# Patient Record
Sex: Female | Born: 1955 | Race: White | Hispanic: No | Smoking: Never smoker
Health system: Southern US, Community
[De-identification: ages and names within clinical notes are randomized; demographics above are authoritative.]

## PROBLEM LIST (undated history)

## (undated) DIAGNOSIS — G8929 Other chronic pain: Secondary | ICD-10-CM

## (undated) DIAGNOSIS — F419 Anxiety disorder, unspecified: Secondary | ICD-10-CM

## (undated) DIAGNOSIS — F32A Depression, unspecified: Secondary | ICD-10-CM

## (undated) DIAGNOSIS — C801 Malignant (primary) neoplasm, unspecified: Secondary | ICD-10-CM

## (undated) DIAGNOSIS — K449 Diaphragmatic hernia without obstruction or gangrene: Secondary | ICD-10-CM

## (undated) DIAGNOSIS — J189 Pneumonia, unspecified organism: Secondary | ICD-10-CM

## (undated) DIAGNOSIS — Z9889 Other specified postprocedural states: Secondary | ICD-10-CM

## (undated) DIAGNOSIS — R112 Nausea with vomiting, unspecified: Secondary | ICD-10-CM

## (undated) DIAGNOSIS — R7303 Prediabetes: Secondary | ICD-10-CM

## (undated) DIAGNOSIS — J9690 Respiratory failure, unspecified, unspecified whether with hypoxia or hypercapnia: Secondary | ICD-10-CM

## (undated) DIAGNOSIS — F4310 Post-traumatic stress disorder, unspecified: Secondary | ICD-10-CM

## (undated) DIAGNOSIS — M542 Cervicalgia: Secondary | ICD-10-CM

## (undated) DIAGNOSIS — R51 Headache: Secondary | ICD-10-CM

## (undated) DIAGNOSIS — N83209 Unspecified ovarian cyst, unspecified side: Secondary | ICD-10-CM

## (undated) DIAGNOSIS — J690 Pneumonitis due to inhalation of food and vomit: Secondary | ICD-10-CM

## (undated) DIAGNOSIS — F329 Major depressive disorder, single episode, unspecified: Secondary | ICD-10-CM

## (undated) DIAGNOSIS — K219 Gastro-esophageal reflux disease without esophagitis: Secondary | ICD-10-CM

## (undated) DIAGNOSIS — J40 Bronchitis, not specified as acute or chronic: Secondary | ICD-10-CM

## (undated) DIAGNOSIS — M1990 Unspecified osteoarthritis, unspecified site: Secondary | ICD-10-CM

## (undated) HISTORY — DX: Major depressive disorder, single episode, unspecified: F32.9

## (undated) HISTORY — DX: Other chronic pain: G89.29

## (undated) HISTORY — DX: Post-traumatic stress disorder, unspecified: F43.10

## (undated) HISTORY — PX: OTHER SURGICAL HISTORY: SHX169

## (undated) HISTORY — DX: Anxiety disorder, unspecified: F41.9

## (undated) HISTORY — DX: Cervicalgia: M54.2

## (undated) HISTORY — DX: Depression, unspecified: F32.A

---
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
## 1984-09-12 HISTORY — PX: ABDOMINAL HYSTERECTOMY: SHX81

## 1984-09-12 HISTORY — PX: CERVIX SURGERY: SHX593

## 1998-07-13 ENCOUNTER — Ambulatory Visit (HOSPITAL_COMMUNITY): Admission: RE | Admit: 1998-07-13 | Discharge: 1998-07-13 | Payer: Self-pay | Admitting: Obstetrics and Gynecology

## 1998-07-13 ENCOUNTER — Encounter: Payer: Self-pay | Admitting: Obstetrics and Gynecology

## 1998-08-14 ENCOUNTER — Other Ambulatory Visit: Admission: RE | Admit: 1998-08-14 | Discharge: 1998-08-14 | Payer: Self-pay | Admitting: Obstetrics and Gynecology

## 1999-07-26 ENCOUNTER — Encounter: Payer: Self-pay | Admitting: Obstetrics and Gynecology

## 1999-07-26 ENCOUNTER — Ambulatory Visit (HOSPITAL_COMMUNITY): Admission: RE | Admit: 1999-07-26 | Discharge: 1999-07-26 | Payer: Self-pay | Admitting: Obstetrics and Gynecology

## 1999-08-10 ENCOUNTER — Other Ambulatory Visit: Admission: RE | Admit: 1999-08-10 | Discharge: 1999-08-10 | Payer: Self-pay | Admitting: Obstetrics and Gynecology

## 1999-12-03 ENCOUNTER — Emergency Department (HOSPITAL_COMMUNITY): Admission: EM | Admit: 1999-12-03 | Discharge: 1999-12-03 | Payer: Self-pay | Admitting: Emergency Medicine

## 2000-07-28 ENCOUNTER — Ambulatory Visit (HOSPITAL_COMMUNITY): Admission: RE | Admit: 2000-07-28 | Discharge: 2000-07-28 | Payer: Self-pay | Admitting: Obstetrics and Gynecology

## 2000-07-28 ENCOUNTER — Encounter: Payer: Self-pay | Admitting: Obstetrics and Gynecology

## 2000-08-01 ENCOUNTER — Encounter: Payer: Self-pay | Admitting: Obstetrics and Gynecology

## 2000-08-01 ENCOUNTER — Encounter: Admission: RE | Admit: 2000-08-01 | Discharge: 2000-08-01 | Payer: Self-pay | Admitting: Obstetrics and Gynecology

## 2000-08-21 ENCOUNTER — Other Ambulatory Visit: Admission: RE | Admit: 2000-08-21 | Discharge: 2000-08-21 | Payer: Self-pay | Admitting: Obstetrics and Gynecology

## 2001-08-08 ENCOUNTER — Encounter: Payer: Self-pay | Admitting: Obstetrics and Gynecology

## 2001-08-08 ENCOUNTER — Encounter: Admission: RE | Admit: 2001-08-08 | Discharge: 2001-08-08 | Payer: Self-pay | Admitting: Obstetrics and Gynecology

## 2001-11-07 ENCOUNTER — Other Ambulatory Visit: Admission: RE | Admit: 2001-11-07 | Discharge: 2001-11-07 | Payer: Self-pay | Admitting: Obstetrics and Gynecology

## 2002-08-14 ENCOUNTER — Encounter: Payer: Self-pay | Admitting: Obstetrics and Gynecology

## 2002-08-14 ENCOUNTER — Ambulatory Visit (HOSPITAL_COMMUNITY): Admission: RE | Admit: 2002-08-14 | Discharge: 2002-08-14 | Payer: Self-pay | Admitting: Obstetrics and Gynecology

## 2002-11-27 ENCOUNTER — Other Ambulatory Visit: Admission: RE | Admit: 2002-11-27 | Discharge: 2002-11-27 | Payer: Self-pay | Admitting: Obstetrics and Gynecology

## 2003-05-12 ENCOUNTER — Encounter: Payer: Self-pay | Admitting: Obstetrics and Gynecology

## 2003-05-12 ENCOUNTER — Encounter: Admission: RE | Admit: 2003-05-12 | Discharge: 2003-05-12 | Payer: Self-pay | Admitting: Obstetrics and Gynecology

## 2003-05-21 ENCOUNTER — Encounter
Admission: RE | Admit: 2003-05-21 | Discharge: 2003-08-19 | Payer: Self-pay | Admitting: Physical Medicine & Rehabilitation

## 2004-03-24 ENCOUNTER — Encounter
Admission: RE | Admit: 2004-03-24 | Discharge: 2004-06-22 | Payer: Self-pay | Admitting: Physical Medicine & Rehabilitation

## 2004-05-13 ENCOUNTER — Encounter: Admission: RE | Admit: 2004-05-13 | Discharge: 2004-05-13 | Payer: Self-pay | Admitting: Obstetrics and Gynecology

## 2004-09-28 ENCOUNTER — Encounter
Admission: RE | Admit: 2004-09-28 | Discharge: 2004-12-27 | Payer: Self-pay | Admitting: Physical Medicine & Rehabilitation

## 2004-09-29 ENCOUNTER — Ambulatory Visit: Payer: Self-pay | Admitting: Physical Medicine & Rehabilitation

## 2004-10-07 ENCOUNTER — Other Ambulatory Visit: Admission: RE | Admit: 2004-10-07 | Discharge: 2004-10-07 | Payer: Self-pay | Admitting: Obstetrics and Gynecology

## 2005-03-11 ENCOUNTER — Encounter
Admission: RE | Admit: 2005-03-11 | Discharge: 2005-06-09 | Payer: Self-pay | Admitting: Physical Medicine & Rehabilitation

## 2005-03-16 ENCOUNTER — Ambulatory Visit: Payer: Self-pay | Admitting: Physical Medicine & Rehabilitation

## 2005-06-17 ENCOUNTER — Encounter: Admission: RE | Admit: 2005-06-17 | Discharge: 2005-06-17 | Payer: Self-pay | Admitting: Obstetrics and Gynecology

## 2005-09-19 ENCOUNTER — Other Ambulatory Visit: Admission: RE | Admit: 2005-09-19 | Discharge: 2005-09-19 | Payer: Self-pay | Admitting: Obstetrics and Gynecology

## 2006-07-11 ENCOUNTER — Ambulatory Visit: Payer: Self-pay | Admitting: Physical Medicine & Rehabilitation

## 2006-07-11 ENCOUNTER — Encounter
Admission: RE | Admit: 2006-07-11 | Discharge: 2006-10-09 | Payer: Self-pay | Admitting: Physical Medicine & Rehabilitation

## 2006-07-13 ENCOUNTER — Encounter: Admission: RE | Admit: 2006-07-13 | Discharge: 2006-07-13 | Payer: Self-pay | Admitting: Obstetrics and Gynecology

## 2007-01-03 ENCOUNTER — Ambulatory Visit: Payer: Self-pay | Admitting: Physical Medicine & Rehabilitation

## 2007-01-03 ENCOUNTER — Encounter
Admission: RE | Admit: 2007-01-03 | Discharge: 2007-04-03 | Payer: Self-pay | Admitting: Physical Medicine & Rehabilitation

## 2007-06-29 ENCOUNTER — Encounter
Admission: RE | Admit: 2007-06-29 | Discharge: 2007-07-02 | Payer: Self-pay | Admitting: Physical Medicine & Rehabilitation

## 2007-06-29 ENCOUNTER — Ambulatory Visit: Payer: Self-pay | Admitting: Physical Medicine & Rehabilitation

## 2007-08-02 ENCOUNTER — Encounter: Admission: RE | Admit: 2007-08-02 | Discharge: 2007-08-02 | Payer: Self-pay | Admitting: Obstetrics and Gynecology

## 2007-11-02 ENCOUNTER — Encounter
Admission: RE | Admit: 2007-11-02 | Discharge: 2007-11-14 | Payer: Self-pay | Admitting: Physical Medicine & Rehabilitation

## 2007-11-13 ENCOUNTER — Ambulatory Visit: Payer: Self-pay | Admitting: Physical Medicine & Rehabilitation

## 2008-02-15 ENCOUNTER — Ambulatory Visit: Payer: Self-pay | Admitting: Physical Medicine & Rehabilitation

## 2008-02-15 ENCOUNTER — Encounter
Admission: RE | Admit: 2008-02-15 | Discharge: 2008-02-15 | Payer: Self-pay | Admitting: Physical Medicine & Rehabilitation

## 2008-07-31 ENCOUNTER — Encounter
Admission: RE | Admit: 2008-07-31 | Discharge: 2008-08-01 | Payer: Self-pay | Admitting: Physical Medicine & Rehabilitation

## 2008-08-01 ENCOUNTER — Ambulatory Visit: Payer: Self-pay | Admitting: Physical Medicine & Rehabilitation

## 2008-08-12 ENCOUNTER — Encounter: Admission: RE | Admit: 2008-08-12 | Discharge: 2008-08-12 | Payer: Self-pay | Admitting: Obstetrics and Gynecology

## 2009-04-10 ENCOUNTER — Ambulatory Visit: Payer: Self-pay | Admitting: Family Medicine

## 2009-04-10 DIAGNOSIS — F411 Generalized anxiety disorder: Secondary | ICD-10-CM | POA: Insufficient documentation

## 2009-04-10 DIAGNOSIS — M25559 Pain in unspecified hip: Secondary | ICD-10-CM | POA: Insufficient documentation

## 2009-04-10 DIAGNOSIS — M542 Cervicalgia: Secondary | ICD-10-CM | POA: Insufficient documentation

## 2009-04-10 DIAGNOSIS — F329 Major depressive disorder, single episode, unspecified: Secondary | ICD-10-CM | POA: Insufficient documentation

## 2009-04-10 DIAGNOSIS — M25519 Pain in unspecified shoulder: Secondary | ICD-10-CM | POA: Insufficient documentation

## 2009-04-10 DIAGNOSIS — F3289 Other specified depressive episodes: Secondary | ICD-10-CM | POA: Insufficient documentation

## 2009-06-23 ENCOUNTER — Ambulatory Visit: Payer: Self-pay | Admitting: Family Medicine

## 2009-06-23 DIAGNOSIS — J019 Acute sinusitis, unspecified: Secondary | ICD-10-CM | POA: Insufficient documentation

## 2009-08-13 ENCOUNTER — Encounter: Admission: RE | Admit: 2009-08-13 | Discharge: 2009-08-13 | Payer: Self-pay | Admitting: Obstetrics and Gynecology

## 2010-01-07 ENCOUNTER — Ambulatory Visit: Payer: Self-pay | Admitting: Family Medicine

## 2010-03-14 ENCOUNTER — Emergency Department (HOSPITAL_COMMUNITY): Admission: EM | Admit: 2010-03-14 | Discharge: 2010-03-14 | Payer: Self-pay | Admitting: Emergency Medicine

## 2010-03-19 ENCOUNTER — Telehealth: Payer: Self-pay | Admitting: Internal Medicine

## 2010-09-01 ENCOUNTER — Encounter
Admission: RE | Admit: 2010-09-01 | Discharge: 2010-09-01 | Payer: Self-pay | Source: Home / Self Care | Attending: Family Medicine | Admitting: Family Medicine

## 2010-09-12 HISTORY — PX: KNEE SURGERY: SHX244
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
## 2010-10-12 NOTE — Progress Notes (Signed)
Summary: walk-in  Phone Note Other Incoming   Caller: walked in Summary of Call: C/o heartburfor med. Advisede Dr Jacki Cones. Went to ER [facility]) Sunday with chest burning. Tests done, incl CT which showed hiatal hernia. Advised Dr Clent Ridges out of office- try OTC meds (prevacid or prilosec) and call if not better. Initial call taken by: Raechel Ache, RN,  March 19, 2010 3:02 PM

## 2010-11-28 LAB — POCT I-STAT, CHEM 8
BUN: 15 mg/dL (ref 6–23)
Chloride: 108 mEq/L (ref 96–112)
Hemoglobin: 14.3 g/dL (ref 12.0–15.0)
Potassium: 4.2 mEq/L (ref 3.5–5.1)
Sodium: 144 mEq/L (ref 135–145)
TCO2: 28 mmol/L (ref 0–100)

## 2010-11-28 LAB — D-DIMER, QUANTITATIVE: D-Dimer, Quant: 0.79 ug/mL-FEU — ABNORMAL HIGH (ref 0.00–0.48)

## 2011-01-25 NOTE — Assessment & Plan Note (Signed)
Ms. Jobst returns to clinic today for followup evaluation.  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  She does do some lifting does  as much as 30 pounds but occasionally lifts as little as 1-5 pounds.  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   The patient reports that her OB/GYN has started her on some sleep  medication and she would like a refill on that.  She is trying to check  on that so that we can get that refilled for her today.  She continues  to take the trazodone approximately 50 mg to 100 mg nightly.  She does  not need a refill on her Klonopin or Motrin at this time.   MEDICATIONS:  1. Motrin 400 mg 1-2 tablets p.o. t.i.d. p.r.n.  2. Klonopin 1 mg 1-2 tablets p.o. nightly.  3. Trazodone 50-100 mg nightly.   REVIEW OF SYSTEMS:  Positive for constipation, weight gain, and night  sweats.   PHYSICAL EXAMINATION:  GENERAL:  Well-appearing middle-aged adult female  in mild to no acute discomfort.  VITAL SIGNS:  Blood pressure 138/78 with pulse 35, respiratory rate 18,  and O2 saturation 98% on room air.  MUSCULOSKELETAL:  She has 4+/5 strength throughout the bilateral upper  and lower extremities.  She ambulates without any assistive device.   IMPRESSION:  1. 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  2. History of migraine headaches secondary to shoulder and neck      muscular pain.  3. Status post right tibia fracture.   In the office today we did refill the patient's trazodone.  We will also  refill the sleep medication once she informs [place] what that is that has  been prescribed by her OB/GYN.  We will plan on seeing the patient in  follow up in approximately 6 months' time with refills prior to that  appointment as necessary.           ______________________________  Ellwood Dense, M.D.     DC/MedQ  D:  08/01/2008 15:45:37  T:  08/02/2008 04:56:36  Job #:  [id number]
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
## 2011-01-25 NOTE — Assessment & Plan Note (Signed)
[name] returns to the clinic today for followup evaluation.  She reports that she has been back to work for approximately one month.  She works generally from 1:00 a.m. to 5:00 a.m.  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  She works a maximum  of 4 hours per day and feels that that is all she can tolerate at the  present time.  She states that her employer is happy with her present  workload.  She does request that I fill out a duty status report for her  as I periodically do approximately every 6 to 9 months.  We did complete  that duty status report form for her at a maximum of 4 hours per day  continuous lifting of 0 to 15 pounds and intermittent lifting of 10 to  15 pounds.  She has a copy of that form, and we will keep a copy in our  records.   MEDICATIONS:  1. Motrin 400 mg 1 to 2 tablets p.o. t.i.d. p.r.n.  2. Klonopin 1 mg 1 to 2 tablets p.o. q.h.s.  3. Trazodone q.h.s. p.r.n.   REVIEW OF SYSTEMS:  Positive for constipation and weight gain.   PHYSICAL EXAMINATION:  GENERAL:  A well-appearing middle-aged adult  female in mild to no acute discomfort.  VITAL SIGNS:  Blood pressure 108/60 with a pulse of 74, respiratory rate  18, and O2 saturation 99% on room air.  She has 4+/5 strength throughout  the bilateral upper and lower extremities.  Bulk and tone were normal,  and she ambulates without any assistive device.   IMPRESSION:  1. 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  2. History of migraine headaches secondary to shoulder and neck      muscular pain.  3. Status post right tibia fracture.   In the office today, the duty status report was completed for this  patient, a maximum of 4 hours per day of work.  We will plan on seeing  the patient in followup in this office in  approximately 4 to 6 months'  time for routine followup.           ______________________________  Ellwood Dense, M.D.     DC/MedQ  D:  02/15/2008 15:38:06  T:  02/15/2008 17:11:56  Job #:  [id number]
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
## 2011-01-28 NOTE — Assessment & Plan Note (Signed)
HISTORY OF PRESENT ILLNESS:  Ms. [name] returns to the clinic today for  follow up evaluation.  I last saw her in this office March 26, 2004.  The  patient reports that she was out of work between May 2005 and January 2006.  She reports that she is back to work but is actually just discussing with  her employer what type of work she might do.  She actually needs as slip  from the office today stating that she was here for this evaluation.  She  has worked previously in Physicist, medical for the postal  department, but is not sure what type of work she would return to.  She  presently is using Motrin, approximately 1-2 tablets either 2-3 times per  day.  She reports that her pain is slightly eased while she is not working.  She also uses Imitrex on an as needed basis.  She reports that she has  applied for disability retirement and needs copies of some paperwork as  apparently the post office has lost some of the prior paperwork that she  submitted.   MEDICATIONS:  1.  Motrin 1-2 tablets p.o. b.i.d. p.r.n.  2.  Imitrex one tablet daily p.r.n.   PHYSICAL EXAMINATION:  GENERAL APPEARANCE:  Well-appearing, fit adult  female.  VITAL SIGNS:  Blood pressure 129/69 with pulse 95, respirations 16, O2  saturation 95% on room air.  EXTREMITIES:  She has 5-/5 strength throughout the bilateral upper  extremities.  She has decreased range of motion throughout the right upper  extremity with normal range of motion in the left upper extremity.  She  complains of pain with range of motion to the right upper extremity.   IMPRESSION:  1.  Mild/moderate degenerative disk disease.  2.  Chronic right shoulder pain secondary to tendonitis.   PLAN:  At the present time, we have completed paperwork for her in the  office.  We have also given her a slip for an excuse from work today as she  was seen in this office.  We plan on seeing her in follow up in  approximately 6 months time.  She again  is discussing with her prior  employer what type of work she might do.  She would need some restrictions  in terms of overhead arm use, especially on the right side.  Those  restrictions have been stated previously.   We plan on seeing the patient in follow up in this office in 6 months time  or before if needed.       DC/MedQ  D:  09/30/2004 09:41:41  T:  09/30/2004 10:10:12  Job #:  [id number]

## 2011-01-28 NOTE — Assessment & Plan Note (Signed)
[name] returns to the clinic today for followup evaluation. She  basically is coming back to have paperwork completed for her disability  evaluation. I had done a note back in September of 2004 and addressed this  to the proper people at that time. Apparently those people have moved on and  she needs an updated note. I will be dictating that as soon as possible and  get that off to her by phone call so that she can pick it up when it is  ready. The patient continues to have problems involving her right arm. She  reports that she went back to work approximately 3-4 hours per day, 1-2  nights per week and reports that she still has problems involving her arm  especially when she is moving it around and lifting things. 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   MEDICATIONS:  1.  Motrin 400 mg 1-2 tablets p.o. b.i.d.  2.  Klonopin 1 mg daily p.r.n.   PHYSICAL EXAMINATION:  A well appearing fit adult female in mild acute  discomfort. Blood pressure 114/64 with a pulse of 86, respiratory rate 16,  and O2 saturation 97% on room air. She has 5-/5 strength throughout the left  upper and 4/5 strength throughout the right upper extremity.   IMPRESSION:  1.  Mild/moderate degenerative disk disease.  2.  Chronic right shoulder pain secondary to tendinitis.   In the office today, I did give her a slip that she was seen in the office  today and I also refilled her ibuprofen. Will dictate an up to date note for  her regarding her impairments and get that off to the dictation department  so that we can get that returned to her for her purpose as necessary. Will  plan on seeing her in followup in approximately six months time.       DC/MedQ  D:  03/17/2005 10:14:48  T:  03/17/2005 10:34:45  Job #:  [id number]

## 2011-01-28 NOTE — Assessment & Plan Note (Signed)
[name] returns to clinic today for follow up evaluation.  I last  saw her in this office March 16, 2005.  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  She then gradually increased to three days per  week, a total of 6-7 hours each day.  She subsequently went on vacation in  September and decided that she needed to shift to daylight work to provide  supervision for her small grandchild that she has custody of.  She started  working in  approximately October daylight shift and has been working 40  hours per week.  She reports that she is having a lot of pain in her neck  and shoulder along with migraine headaches.  She reports that these have  been worse as the job she is doing now requires her to stand approximately  40 hours per week.  Her job in the evening hours was a lot less strenuous.  She also reports that she is having poor sleep and needs to have a refill on  her Klonopin medication to allow her to have adequate sleep to help with her  pain, overall.  She wonders about cutting back to a lesser amount of work on  a daily basis for at least a short time to see if she could build up again.   MEDICATIONS:  1. Motrin 400 mg 1-2 tablets p.o. b.i.d. to t.i.d.  2. Klonopin 1 mg, 1/2 tablet to 1 tablet q.h.s. p.r.n.  3. Dermatex 100 mg 1 to 3 tablets p.o. daily for migraine headaches.   REVIEW OF SYSTEMS:  Positive for constipation.   PHYSICAL EXAMINATION:  Reasonably well appearing fit adult female in mild  acute discomfort.  Blood pressure 118/71, pulse 84, respiratory rate 16, and  O2 saturation 100% on room air.  She has 5-/5 strength of the left upper  extremity and 4/5 strength of the right upper extremity.  She has decreased  range of motion of her cervical spine and pain with shoulder flexion and  abduction beyond 90 degrees.   IMPRESSION:  1. Mild/moderate degenerative disc  disease of the cervical spine.  2. Chronic right shoulder pain secondary to tendinitis.  3. History of migraine headaches secondary to shoulder/neck muscular pain.   In the office today, we did give the patient a new prescription for Imitrex  along with her Klonopin and Motrin.  We also completed a form for the [place] [employer] asking that she do her present job six hours per  day, five days per week, for a maximum of 30 hours per week.  She is allowed  continuous lifting of 0 to 5 pounds and intermittent lifting of 10 to 15  pounds with avoidance of over shoulder work to no more than two hours per  day.   We will plan on seeing the patient in follow up in this office in  approximately six months time and we will adjust her work schedule as  tolerable over the next several months.           ______________________________  Ellwood Dense, M.D.     DC/MedQ  D:  07/12/2006 15:07:44  T:  07/12/2006 22:04:47  Job #:  [id number]

## 2011-07-30 ENCOUNTER — Encounter: Payer: Self-pay | Admitting: *Deleted

## 2011-07-30 ENCOUNTER — Emergency Department (HOSPITAL_COMMUNITY)
Admission: EM | Admit: 2011-07-30 | Discharge: 2011-07-30 | Disposition: A | Discharge status: Home / Self Care | Payer: No Typology Code available for payment source | Attending: Emergency Medicine | Admitting: Emergency Medicine

## 2011-07-30 ENCOUNTER — Emergency Department (HOSPITAL_COMMUNITY): Payer: No Typology Code available for payment source

## 2011-07-30 DIAGNOSIS — W010XXA Fall on same level from slipping, tripping and stumbling without subsequent striking against object, initial encounter: Secondary | ICD-10-CM | POA: Insufficient documentation

## 2011-07-30 DIAGNOSIS — R61 Generalized hyperhidrosis: Secondary | ICD-10-CM | POA: Insufficient documentation

## 2011-07-30 DIAGNOSIS — S82409A Unspecified fracture of shaft of unspecified fibula, initial encounter for closed fracture: Secondary | ICD-10-CM | POA: Insufficient documentation

## 2011-07-30 DIAGNOSIS — M25539 Pain in unspecified wrist: Secondary | ICD-10-CM | POA: Insufficient documentation

## 2011-07-30 DIAGNOSIS — S52123A Displaced fracture of head of unspecified radius, initial encounter for closed fracture: Secondary | ICD-10-CM

## 2011-07-30 DIAGNOSIS — M25579 Pain in unspecified ankle and joints of unspecified foot: Secondary | ICD-10-CM | POA: Insufficient documentation

## 2011-07-30 HISTORY — DX: Unspecified ovarian cyst, unspecified side: N83.209

## 2011-07-30 MED ORDER — SODIUM CHLORIDE 0.9 % IV BOLUS (SEPSIS)
1000.0000 mL | Freq: Once | INTRAVENOUS | Status: AC
  Administered 2011-07-30: 1000 mL via INTRAVENOUS

## 2011-07-30 MED ORDER — HYDROCODONE-ACETAMINOPHEN 5-325 MG PO TABS: 2.0000 | ORAL_TABLET | Freq: Three times a day (TID) | ORAL | Status: DC | PRN

## 2011-07-30 MED ORDER — HYDROMORPHONE HCL PF 1 MG/ML IJ SOLN: 1.0000 mg | Freq: Once | INTRAMUSCULAR | Status: DC

## 2011-07-30 MED ORDER — HYDROCODONE-ACETAMINOPHEN 10-325 MG PO TABS
1.0000 | ORAL_TABLET | ORAL | Status: AC
  Administered 2011-07-30: 1 via ORAL
  Filled 2011-07-30: qty 1

## 2011-07-30 MED ORDER — HYDROMORPHONE HCL PF 2 MG/ML IJ SOLN
INTRAMUSCULAR | Status: AC
  Administered 2011-07-30: 1 mg
  Filled 2011-07-30: qty 1

## 2011-07-30 NOTE — ED Notes (Signed)
Ice given to pt per request.

## 2011-07-30 NOTE — ED Notes (Signed)
Pt fell about 1:30 pm, injury to left elbow, arm, thumb,

## 2011-07-30 NOTE — ED Notes (Signed)
Awaiting splints.  Pain meds given.

## 2011-07-30 NOTE — ED Provider Notes (Signed)
History     CSN: [id number] Arrival date & time: 07/30/2011  4:05 PM   First MD Initiated Contact with Patient 07/30/11 1627      Chief Complaint  Patient presents with  . Arm Injury    left elbow  . Ankle Pain    left ankle  . Hand Injury    left thumb    (Consider location/radiation/quality/duration/timing/severity/associated sxs/prior treatment) HPI The patient presents 4 hours after a fall with pain in her left arm, left ankle, and right wrist.  She notes that she slipped, and fell onto concrete. Since the event she said pain, described as sharp in all of the aforementioned areas. The pain has not been relieved with anything, it is worse with motion. No head,, no LOC, no disorientation, no nausea. Past Medical History  Diagnosis Date  . Ovarian cyst     Past Surgical History  Procedure Date  . Knee surgery   . Leg surgery   . Cesarean section   . Abdominal hysterectomy     No family history on file.  History  Substance Use Topics  . Smoking status: Never Smoker   . Smokeless tobacco: Not on file  . Alcohol Use: Yes    OB History    Grav Para Term Preterm Abortions TAB SAB Ect Mult Living                  Review of Systems  All other systems reviewed and are negative.    Allergies  Meperidine hcl  Home Medications  No current outpatient prescriptions on file.  BP 130/80  Pulse 89  Temp(Src) 98.3 F (36.8 C) (Oral)  Resp 18  SpO2 99%  Physical Exam  Constitutional: She appears well-developed and well-nourished. No distress.  HENT:  Head: Normocephalic and atraumatic.  Eyes: EOM are normal. Pupils are equal, round, and reactive to light.  Neck: Neck supple. No thyromegaly present.  Cardiovascular: Regular rhythm and intact distal pulses.   Pulmonary/Chest: Effort normal.  Abdominal: She exhibits no distension.  Musculoskeletal:       The patient has tenderness in multiple areas. #1 right wrist, range of motion appropriate in all  dimensions, strength 5 out of 5 in all dimensions. No snuffbox tenderness. Diffuse pain on palpation circumferentially about the wrist. No overlying abrasion. #2 left ankle tenderness to palpation about the lateral anterior inferior tibia. No loss of range of motion, strength 5 out of 5. Distal pulses appropriate. No overlying edema, nor superficial color changes. #3, left elbow range of motion intact, 5 out of 5 strength in all dimensions, no overlying erythema or edema. #4 left proximal upper arm. No appreciable tenderness to palpation about the superiormost humerus. Tender to palpation about the left mid humerus, with pain preventing any range of motion exercise.  Lymphadenopathy:    She has no cervical adenopathy.  Skin: She is diaphoretic.    ED Course  Procedures (including critical care time)  Labs Reviewed - No data to display No results found.   No diagnosis found.   Rule out fracture versus contusion MDM  This 55 year old female presents following a mechanical fall with pain in her ankles, and left arm. On exam the patient is uncomfortable although in no distress.  X-rays demonstrate a displaced left radial head fracture, as well as a left distal fibula fracture.  The patient had her wounds dressed with a long arm splint on the left, and an immobilizing boot on the left as well.  A  discussion was held with patient regarding her necessity to be particularly careful with ambulating, noting that she is recently recovered from a prior knee intervention, and these new splints will change her balance. Patient will be discharged to follow up with orthopedics this week        Gerhard Munch, MD 07/30/11 2112

## 2011-08-02 ENCOUNTER — Other Ambulatory Visit: Payer: Self-pay | Admitting: Orthopedic Surgery

## 2011-08-02 ENCOUNTER — Ambulatory Visit
Admission: RE | Admit: 2011-08-02 | Discharge: 2011-08-02 | Disposition: A | Discharge status: Home / Self Care | Payer: No Typology Code available for payment source | Source: Ambulatory Visit | Attending: Orthopedic Surgery | Admitting: Orthopedic Surgery

## 2011-08-02 DIAGNOSIS — S52109A Unspecified fracture of upper end of unspecified radius, initial encounter for closed fracture: Secondary | ICD-10-CM

## 2011-08-05 ENCOUNTER — Other Ambulatory Visit: Payer: Self-pay

## 2011-08-05 ENCOUNTER — Encounter (HOSPITAL_COMMUNITY)
Admission: RE | Admit: 2011-08-05 | Discharge: 2011-08-05 | Disposition: A | Discharge status: Home / Self Care | Payer: No Typology Code available for payment source | Source: Ambulatory Visit | Attending: Orthopedic Surgery | Admitting: Orthopedic Surgery

## 2011-08-05 ENCOUNTER — Encounter (HOSPITAL_COMMUNITY): Payer: Self-pay

## 2011-08-05 HISTORY — DX: Gastro-esophageal reflux disease without esophagitis: K21.9

## 2011-08-05 HISTORY — DX: Malignant (primary) neoplasm, unspecified: C80.1

## 2011-08-05 HISTORY — DX: Pneumonia, unspecified organism: J18.9

## 2011-08-05 HISTORY — DX: Diaphragmatic hernia without obstruction or gangrene: K44.9

## 2011-08-05 HISTORY — DX: Headache: R51

## 2011-08-05 LAB — CBC
HCT: 33.7 % — ABNORMAL LOW (ref 36.0–46.0)
Hemoglobin: 10.3 g/dL — ABNORMAL LOW (ref 12.0–15.0)
MCH: 23.6 pg — ABNORMAL LOW (ref 26.0–34.0)
MCHC: 30.6 g/dL (ref 30.0–36.0)
MCV: 77.3 fL — ABNORMAL LOW (ref 78.0–100.0)

## 2011-08-05 LAB — BASIC METABOLIC PANEL
BUN: 13 mg/dL (ref 6–23)
CO2: 30 mEq/L (ref 19–32)
Chloride: 105 mEq/L (ref 96–112)
Glucose, Bld: 178 mg/dL — ABNORMAL HIGH (ref 70–99)
Potassium: 4.4 mEq/L (ref 3.5–5.1)

## 2011-08-05 MED ORDER — CEFAZOLIN SODIUM-DEXTROSE 2-3 GM-% IV SOLR: 2.0000 g | INTRAVENOUS | Status: DC

## 2011-08-05 MED ORDER — CHLORHEXIDINE GLUCONATE 4 % EX LIQD: 60.0000 mL | Freq: Once | CUTANEOUS | Status: DC

## 2011-08-05 NOTE — Pre-Procedure Instructions (Signed)
20 Anushri Casalino  08/05/2011   Your procedure is scheduled on:  08/06/11  Report to [facility] at 600 AM.  Call this number if you have problems the morning of surgery: [phone]   Remember:   Do not eat food:After Midnight.  may drink clear liquids up til: 4 Hours before arrival.  Take these medicines the morning of surgery with A SIP OF WATER:hydrocondone,cymbalta,prilosec, lyrica   Do not wear jewelry, make-up or nail polish.  Do not wear lotions, powders, or perfumes. You may wear deodorant.  Do not shave 48 hours prior to surgery.  Do not bring valuables to the hospital.  Contacts, dentures or bridgework may not be worn into surgery.  Leave suitcase in the car. After surgery it may be brought to your room.  For patients admitted to the hospital, checkout time is 11:00 AM the day of discharge.   Patients discharged the day of surgery will not be allowed to drive home.  Name and phone number of your driver:rodger [phone]  Special Instructions: CHG Shower Use Special Wash: 1/2 bottle night before surgery and 1/2 bottle morning of surgery.   Please read over the following fact sheets that you were given: Pain Booklet, Coughing and Deep Breathing, MRSA Information and Surgical Site Infection Prevention

## 2011-08-06 ENCOUNTER — Encounter (HOSPITAL_COMMUNITY): Payer: Self-pay | Admitting: Anesthesiology

## 2011-08-06 ENCOUNTER — Ambulatory Visit (HOSPITAL_COMMUNITY)
Admission: RE | Admit: 2011-08-06 | Discharge: 2011-08-07 | Disposition: A | Discharge status: Home / Self Care | Payer: No Typology Code available for payment source | Source: Ambulatory Visit | Attending: Orthopedic Surgery | Admitting: Orthopedic Surgery

## 2011-08-06 ENCOUNTER — Ambulatory Visit (HOSPITAL_COMMUNITY): Payer: No Typology Code available for payment source | Admitting: Anesthesiology

## 2011-08-06 ENCOUNTER — Encounter (HOSPITAL_COMMUNITY): Payer: Self-pay | Admitting: *Deleted

## 2011-08-06 ENCOUNTER — Encounter (HOSPITAL_COMMUNITY)
Admission: RE | Disposition: A | Discharge status: Home / Self Care | Payer: Self-pay | Source: Ambulatory Visit | Attending: Orthopedic Surgery

## 2011-08-06 DIAGNOSIS — F3289 Other specified depressive episodes: Secondary | ICD-10-CM

## 2011-08-06 DIAGNOSIS — J019 Acute sinusitis, unspecified: Secondary | ICD-10-CM

## 2011-08-06 DIAGNOSIS — M542 Cervicalgia: Secondary | ICD-10-CM

## 2011-08-06 DIAGNOSIS — F411 Generalized anxiety disorder: Secondary | ICD-10-CM

## 2011-08-06 DIAGNOSIS — X58XXXA Exposure to other specified factors, initial encounter: Secondary | ICD-10-CM | POA: Insufficient documentation

## 2011-08-06 DIAGNOSIS — S52123A Displaced fracture of head of unspecified radius, initial encounter for closed fracture: Secondary | ICD-10-CM | POA: Diagnosis present

## 2011-08-06 DIAGNOSIS — R112 Nausea with vomiting, unspecified: Secondary | ICD-10-CM

## 2011-08-06 DIAGNOSIS — Y929 Unspecified place or not applicable: Secondary | ICD-10-CM | POA: Insufficient documentation

## 2011-08-06 DIAGNOSIS — Z01812 Encounter for preprocedural laboratory examination: Secondary | ICD-10-CM | POA: Insufficient documentation

## 2011-08-06 DIAGNOSIS — S42493A Other displaced fracture of lower end of unspecified humerus, initial encounter for closed fracture: Secondary | ICD-10-CM | POA: Insufficient documentation

## 2011-08-06 DIAGNOSIS — M25559 Pain in unspecified hip: Secondary | ICD-10-CM

## 2011-08-06 DIAGNOSIS — F329 Major depressive disorder, single episode, unspecified: Secondary | ICD-10-CM

## 2011-08-06 DIAGNOSIS — Z9889 Other specified postprocedural states: Secondary | ICD-10-CM

## 2011-08-06 DIAGNOSIS — Z01818 Encounter for other preprocedural examination: Secondary | ICD-10-CM | POA: Insufficient documentation

## 2011-08-06 DIAGNOSIS — S53449A Ulnar collateral ligament sprain of unspecified elbow, initial encounter: Secondary | ICD-10-CM | POA: Insufficient documentation

## 2011-08-06 DIAGNOSIS — M25519 Pain in unspecified shoulder: Secondary | ICD-10-CM

## 2011-08-06 DIAGNOSIS — Z01810 Encounter for preprocedural cardiovascular examination: Secondary | ICD-10-CM | POA: Insufficient documentation

## 2011-08-06 HISTORY — DX: Nausea with vomiting, unspecified: R11.2

## 2011-08-06 HISTORY — DX: Other specified postprocedural states: Z98.890

## 2011-08-06 HISTORY — PX: ORIF ELBOW FRACTURE: SHX5031

## 2011-08-06 SURGERY — OPEN REDUCTION INTERNAL FIXATION (ORIF) ELBOW/OLECRANON FRACTURE
Anesthesia: General | Site: Elbow | Laterality: Left | Wound class: Clean

## 2011-08-06 MED ORDER — KCL IN DEXTROSE-NACL 20-5-0.45 MEQ/L-%-% IV SOLN
INTRAVENOUS | Status: DC
  Administered 2011-08-06 – 2011-08-07 (×2): via INTRAVENOUS
  Filled 2011-08-06 (×3): qty 1000

## 2011-08-06 MED ORDER — GLYCOPYRROLATE 0.2 MG/ML IJ SOLN
INTRAMUSCULAR | Status: DC | PRN
  Administered 2011-08-06: .6 mg via INTRAVENOUS

## 2011-08-06 MED ORDER — LACTATED RINGERS IV SOLN
INTRAVENOUS | Status: DC | PRN
  Administered 2011-08-06 (×2): via INTRAVENOUS

## 2011-08-06 MED ORDER — OXYCODONE-ACETAMINOPHEN 5-325 MG PO TABS
1.0000 | ORAL_TABLET | ORAL | Status: DC | PRN
  Administered 2011-08-06 – 2011-08-07 (×4): 2 via ORAL
  Filled 2011-08-06 (×4): qty 2

## 2011-08-06 MED ORDER — ONDANSETRON HCL 4 MG/2ML IJ SOLN: 4.0000 mg | Freq: Four times a day (QID) | INTRAMUSCULAR | Status: DC | PRN

## 2011-08-06 MED ORDER — TRAZODONE 25 MG HALF TABLET
25.0000 mg | ORAL_TABLET | Freq: Every day | ORAL | Status: DC
  Filled 2011-08-06 (×2): qty 1

## 2011-08-06 MED ORDER — HYDROMORPHONE HCL PF 1 MG/ML IJ SOLN
0.5000 mg | INTRAMUSCULAR | Status: DC | PRN
  Administered 2011-08-06 (×2): 1 mg via INTRAVENOUS
  Administered 2011-08-07: 0.5 mg via INTRAVENOUS
  Administered 2011-08-07 (×2): 1 mg via INTRAVENOUS
  Filled 2011-08-06 (×5): qty 1

## 2011-08-06 MED ORDER — METHOCARBAMOL 100 MG/ML IJ SOLN
500.0000 mg | Freq: Four times a day (QID) | INTRAVENOUS | Status: DC | PRN
  Filled 2011-08-06 (×2): qty 5

## 2011-08-06 MED ORDER — MEPERIDINE HCL 25 MG/ML IJ SOLN: 6.2500 mg | INTRAMUSCULAR | Status: DC | PRN

## 2011-08-06 MED ORDER — DULOXETINE HCL 30 MG PO CPEP
30.0000 mg | ORAL_CAPSULE | Freq: Two times a day (BID) | ORAL | Status: DC
  Administered 2011-08-06: 30 mg via ORAL
  Filled 2011-08-06 (×4): qty 1

## 2011-08-06 MED ORDER — METHOCARBAMOL 500 MG PO TABS
500.0000 mg | ORAL_TABLET | Freq: Four times a day (QID) | ORAL | Status: DC | PRN
  Administered 2011-08-07: 500 mg via ORAL
  Filled 2011-08-06: qty 1

## 2011-08-06 MED ORDER — HYDROCODONE-ACETAMINOPHEN 7.5-325 MG PO TABS: 1.0000 | ORAL_TABLET | ORAL | Status: DC | PRN

## 2011-08-06 MED ORDER — CEFAZOLIN SODIUM 1-5 GM-% IV SOLN
INTRAVENOUS | Status: DC | PRN
  Administered 2011-08-06: 2 g via INTRAVENOUS
  Administered 2011-08-07: 1000 mg via INTRAVASCULAR

## 2011-08-06 MED ORDER — IBUPROFEN 800 MG PO TABS
800.0000 mg | ORAL_TABLET | Freq: Four times a day (QID) | ORAL | Status: DC | PRN
  Filled 2011-08-06: qty 1

## 2011-08-06 MED ORDER — MIDAZOLAM HCL 2 MG/2ML IJ SOLN: 0.5000 mg | Freq: Once | INTRAMUSCULAR | Status: DC | PRN

## 2011-08-06 MED ORDER — OXYCODONE HCL 5 MG PO TABS
5.0000 mg | ORAL_TABLET | ORAL | Status: DC | PRN
  Administered 2011-08-07: 10 mg via ORAL
  Filled 2011-08-06: qty 2

## 2011-08-06 MED ORDER — HYDROCODONE-ACETAMINOPHEN 5-325 MG PO TABS: 2.0000 | ORAL_TABLET | Freq: Four times a day (QID) | ORAL | Status: DC | PRN

## 2011-08-06 MED ORDER — PREGABALIN 75 MG PO CAPS
200.0000 mg | ORAL_CAPSULE | Freq: Two times a day (BID) | ORAL | Status: DC
  Administered 2011-08-06: 200 mg via ORAL
  Filled 2011-08-06: qty 4
  Filled 2011-08-06: qty 2
  Filled 2011-08-06: qty 4

## 2011-08-06 MED ORDER — SENNOSIDES-DOCUSATE SODIUM 8.6-50 MG PO TABS: 1.0000 | ORAL_TABLET | Freq: Every evening | ORAL | Status: DC | PRN

## 2011-08-06 MED ORDER — ONDANSETRON HCL 4 MG PO TABS
4.0000 mg | ORAL_TABLET | Freq: Four times a day (QID) | ORAL | Status: DC | PRN
  Administered 2011-08-07: 4 mg via ORAL
  Filled 2011-08-06: qty 1

## 2011-08-06 MED ORDER — NEOSTIGMINE METHYLSULFATE 1 MG/ML IJ SOLN
INTRAMUSCULAR | Status: DC | PRN
  Administered 2011-08-06: 3 mg via INTRAVENOUS

## 2011-08-06 MED ORDER — PROPOFOL 10 MG/ML IV EMUL
INTRAVENOUS | Status: DC | PRN
  Administered 2011-08-06: 130 mg via INTRAVENOUS

## 2011-08-06 MED ORDER — DIPHENHYDRAMINE HCL 12.5 MG/5ML PO ELIX
12.5000 mg | ORAL_SOLUTION | ORAL | Status: DC | PRN
  Filled 2011-08-06: qty 10

## 2011-08-06 MED ORDER — HYDROCODONE-ACETAMINOPHEN 5-325 MG PO TABS: 1.5000 | ORAL_TABLET | ORAL | Status: DC | PRN

## 2011-08-06 MED ORDER — MIDAZOLAM HCL 5 MG/5ML IJ SOLN
INTRAMUSCULAR | Status: DC | PRN
  Administered 2011-08-06: 2 mg via INTRAVENOUS

## 2011-08-06 MED ORDER — HYDROMORPHONE HCL PF 1 MG/ML IJ SOLN: 0.2500 mg | INTRAMUSCULAR | Status: DC | PRN

## 2011-08-06 MED ORDER — ONDANSETRON HCL 4 MG/2ML IJ SOLN
INTRAMUSCULAR | Status: DC | PRN
  Administered 2011-08-06: 4 mg via INTRAVENOUS

## 2011-08-06 MED ORDER — CEFAZOLIN SODIUM 1-5 GM-% IV SOLN
1.0000 g | Freq: Four times a day (QID) | INTRAVENOUS | Status: AC
  Administered 2011-08-06 – 2011-08-07 (×3): 1 g via INTRAVENOUS
  Filled 2011-08-06 (×3): qty 50

## 2011-08-06 MED ORDER — HYDROCODONE-ACETAMINOPHEN 5-325 MG PO TABS: 1.0000 | ORAL_TABLET | Freq: Three times a day (TID) | ORAL | Status: DC | PRN

## 2011-08-06 MED ORDER — ROCURONIUM BROMIDE 100 MG/10ML IV SOLN
INTRAVENOUS | Status: DC | PRN
  Administered 2011-08-06: 50 mg via INTRAVENOUS

## 2011-08-06 MED ORDER — SUMATRIPTAN SUCCINATE 100 MG PO TABS
100.0000 mg | ORAL_TABLET | ORAL | Status: DC | PRN
  Filled 2011-08-06: qty 1

## 2011-08-06 MED ORDER — ZOLPIDEM TARTRATE 5 MG PO TABS: 5.0000 mg | ORAL_TABLET | Freq: Every evening | ORAL | Status: DC | PRN

## 2011-08-06 MED ORDER — PANTOPRAZOLE SODIUM 40 MG PO TBEC
40.0000 mg | DELAYED_RELEASE_TABLET | Freq: Every day | ORAL | Status: DC
  Administered 2011-08-06: 40 mg via ORAL
  Filled 2011-08-06: qty 1

## 2011-08-06 MED ORDER — METOCLOPRAMIDE HCL 10 MG PO TABS: 5.0000 mg | ORAL_TABLET | Freq: Three times a day (TID) | ORAL | Status: DC | PRN

## 2011-08-06 MED ORDER — METHOCARBAMOL 500 MG PO TABS: 500.0000 mg | ORAL_TABLET | Freq: Four times a day (QID) | ORAL | Status: AC

## 2011-08-06 MED ORDER — PHENYLEPHRINE HCL 10 MG/ML IJ SOLN
INTRAMUSCULAR | Status: DC | PRN
  Administered 2011-08-06 (×2): 80 ug via INTRAVENOUS

## 2011-08-06 MED ORDER — METOCLOPRAMIDE HCL 5 MG/ML IJ SOLN
5.0000 mg | Freq: Three times a day (TID) | INTRAMUSCULAR | Status: DC | PRN
  Administered 2011-08-06: 10 mg via INTRAVENOUS
  Filled 2011-08-06: qty 2

## 2011-08-06 MED ORDER — CEFAZOLIN SODIUM-DEXTROSE 2-3 GM-% IV SOLR
INTRAVENOUS | Status: AC
  Filled 2011-08-06: qty 50

## 2011-08-06 MED ORDER — DOCUSATE SODIUM 100 MG PO CAPS: 100.0000 mg | ORAL_CAPSULE | Freq: Two times a day (BID) | ORAL | Status: AC

## 2011-08-06 MED ORDER — DROPERIDOL 2.5 MG/ML IJ SOLN: 0.6250 mg | INTRAMUSCULAR | Status: DC | PRN

## 2011-08-06 MED ORDER — FENTANYL CITRATE 0.05 MG/ML IJ SOLN
INTRAMUSCULAR | Status: DC | PRN
  Administered 2011-08-06 (×2): 100 ug via INTRAVENOUS

## 2011-08-06 SURGICAL SUPPLY — 54 items
BANDAGE ELASTIC 3 VELCRO ST LF (GAUZE/BANDAGES/DRESSINGS) ×2 IMPLANT
BANDAGE ELASTIC 4 VELCRO ST LF (GAUZE/BANDAGES/DRESSINGS) ×2 IMPLANT
BANDAGE GAUZE ELAST BULKY 4 IN (GAUZE/BANDAGES/DRESSINGS) ×2 IMPLANT
BIT DRILL CANN 1.6 BIODRIVE (BIT) ×2 IMPLANT
BNDG COHESIVE 4X5 TAN STRL (GAUZE/BANDAGES/DRESSINGS) ×2 IMPLANT
BNDG ESMARK 4X9 LF (GAUZE/BANDAGES/DRESSINGS) ×2 IMPLANT
CLOTH BEACON ORANGE TIMEOUT ST (SAFETY) ×2 IMPLANT
CORDS BIPOLAR (ELECTRODE) ×2 IMPLANT
COVER MAYO STAND STRL (DRAPES) ×2 IMPLANT
COVER SURGICAL LIGHT HANDLE (MISCELLANEOUS) ×2 IMPLANT
CUFF TOURNIQUET SINGLE 18IN (TOURNIQUET CUFF) ×2 IMPLANT
CUFF TOURNIQUET SINGLE 24IN (TOURNIQUET CUFF) IMPLANT
DRAPE INCISE IOBAN 66X45 STRL (DRAPES) ×2 IMPLANT
DRAPE OEC MINIVIEW 54X84 (DRAPES) ×2 IMPLANT
DRAPE ORTHO SPLIT 77X108 STRL (DRAPES) ×2
DRAPE SURG ORHT 6 SPLT 77X108 (DRAPES) ×2 IMPLANT
DRAPE U-SHAPE 47X51 STRL (DRAPES) ×2 IMPLANT
DRSG ADAPTIC 3X8 NADH LF (GAUZE/BANDAGES/DRESSINGS) ×2 IMPLANT
GAUZE KERLIX 2  STERILE LF (GAUZE/BANDAGES/DRESSINGS) ×2 IMPLANT
GAUZE SPONGE 4X4 12PLY STRL LF (GAUZE/BANDAGES/DRESSINGS) ×2 IMPLANT
GLOVE BIOGEL PI IND STRL 8.5 (GLOVE) ×1 IMPLANT
GLOVE BIOGEL PI INDICATOR 8.5 (GLOVE) ×1
GLOVE SURG ORTHO 8.0 STRL STRW (GLOVE) ×4 IMPLANT
GOWN PREVENTION PLUS XLARGE (GOWN DISPOSABLE) ×2 IMPLANT
GOWN STRL NON-REIN LRG LVL3 (GOWN DISPOSABLE) ×4 IMPLANT
K-WIRE .8 (WIRE) ×2 IMPLANT
KIT BASIN OR (CUSTOM PROCEDURE TRAY) ×2 IMPLANT
KIT ROOM TURNOVER OR (KITS) ×2 IMPLANT
LOOP VESSEL MAXI BLUE (MISCELLANEOUS) IMPLANT
MANIFOLD NEPTUNE II (INSTRUMENTS) IMPLANT
NEEDLE HYPO 25GX1X1/2 BEV (NEEDLE) ×2 IMPLANT
NS IRRIG 1000ML POUR BTL (IV SOLUTION) ×2 IMPLANT
PACK ORTHO EXTREMITY (CUSTOM PROCEDURE TRAY) ×2 IMPLANT
PAD ARMBOARD 7.5X6 YLW CONV (MISCELLANEOUS) ×4 IMPLANT
PAD CAST 4YDX4 CTTN HI CHSV (CAST SUPPLIES) ×2 IMPLANT
PADDING CAST COTTON 4X4 STRL (CAST SUPPLIES) ×2
PADDING WEBRIL 4 STERILE (GAUZE/BANDAGES/DRESSINGS) ×2 IMPLANT
SCREW BIODRIVE MICRO 2.3X20 (Screw) ×2 IMPLANT
SOAP 2 % CHG 4 OZ (WOUND CARE) ×2 IMPLANT
SPLINT FIBERGLASS 3X35 (CAST SUPPLIES) ×2 IMPLANT
SPONGE GAUZE 4X4 12PLY (GAUZE/BANDAGES/DRESSINGS) ×2 IMPLANT
SUCTION FRAZIER TIP 10 FR DISP (SUCTIONS) ×2 IMPLANT
SUT MERSILENE 4 0 P 3 (SUTURE) IMPLANT
SUT PROLENE 4 0 P 3 18 (SUTURE) ×4 IMPLANT
SUT PROLENE 4 0 PS 2 18 (SUTURE) ×2 IMPLANT
SUT VIC AB 2-0 CT1 27 (SUTURE) ×1
SUT VIC AB 2-0 CT1 TAPERPNT 27 (SUTURE) ×1 IMPLANT
SYR CONTROL 10ML LL (SYRINGE) ×2 IMPLANT
TOWEL OR 17X24 6PK STRL BLUE (TOWEL DISPOSABLE) ×2 IMPLANT
TOWEL OR 17X26 10 PK STRL BLUE (TOWEL DISPOSABLE) ×4 IMPLANT
TUBE CONNECTING 12X1/4 (SUCTIONS) ×2 IMPLANT
UNDERPAD 30X30 INCONTINENT (UNDERPADS AND DIAPERS) ×2 IMPLANT
WATER STERILE IRR 1000ML POUR (IV SOLUTION) IMPLANT
biomet ×2 IMPLANT

## 2011-08-06 NOTE — Anesthesia Postprocedure Evaluation (Signed)
  Anesthesia Post-op Note  Patient: [name]  Procedure(s) Performed:  OPEN REDUCTION INTERNAL FIXATION (ORIF) ELBOW/OLECRANON FRACTURE  Patient Location: PACU  Anesthesia Type: General  Level of Consciousness: awake, alert  and oriented  Airway and Oxygen Therapy: Patient Spontanous Breathing  Post-op Pain: none  Post-op Assessment: Post-op Vital signs reviewed and Patient's Cardiovascular Status Stable  Post-op Vital Signs: stable  Complications: No apparent anesthesia complications

## 2011-08-06 NOTE — Brief Op Note (Signed)
08/06/2011  10:43 AM  PATIENT:  [name]  55 y.o. female  PRE-OPERATIVE DIAGNOSIS:  Left Radial Head Fracture  POST-OPERATIVE DIAGNOSIS:  Left Radial Head fracture  PROCEDURE:  Procedure(s): OPEN REDUCTION INTERNAL FIXATION (ORIF) ELBOW RADIAL HEAD FRACTURE REPAIR LEFT ELBOW LATERAL ULNAR COLLATERAL LIGAMENT  SURGEON:  Surgeon(s): Sharma Covert  PHYSICIAN ASSISTANT:   ASSISTANTS: none   ANESTHESIA:   general  EBL:  Total I/O In: 1000 [I.V.:1000] Out: -   BLOOD ADMINISTERED:none  DRAINS: none   LOCAL MEDICATIONS USED:  NONE  SPECIMEN:  No Specimen  DISPOSITION OF SPECIMEN:  N/A  COUNTS:  YES  TOURNIQUET:   Total Tourniquet Time Documented: Upper Arm (Left) - 83 minutes  DICTATION: .JOB ID [id number]  PLAN OF CARE: Admit for overnight observation  PATIENT DISPOSITION:  PACU - hemodynamically stable.   Delay start of Pharmacological VTE agent (>24hrs) due to surgical blood loss or risk of bleeding:  {YES/NO/NOT APPLICABLE:20182

## 2011-08-06 NOTE — Transfer of Care (Signed)
Immediate Anesthesia Transfer of Care Note  Patient: [name]  Procedure(s) Performed:  OPEN REDUCTION INTERNAL FIXATION (ORIF) ELBOW/OLECRANON FRACTURE  Patient Location: PACU  Anesthesia Type: General  Level of Consciousness: sedated  Airway & Oxygen Therapy: Patient Spontanous Breathing and Patient connected to nasal cannula oxygen  Post-op Assessment: Report given to PACU RN and Post -op Vital signs reviewed and stable  Post vital signs: Reviewed and stable  Complications: No apparent anesthesia complications

## 2011-08-06 NOTE — Progress Notes (Signed)
Dr. Michelle Piper notified that pt drank approx. 4 oz of fluid at 0330. Per Dr. Michelle Piper continue prep as normal.

## 2011-08-06 NOTE — Anesthesia Procedure Notes (Addendum)
Anesthesia Regional Block:  Supraclavicular block  Pre-Anesthetic Checklist: ,, timeout performed, Correct Patient, Correct Site, Correct Laterality, Correct Procedure, Correct Position, site marked, Risks and benefits discussed,  Surgical consent,  Pre-op evaluation,  At surgeon's request and post-op pain management  Laterality: Left  Prep: chloraprep       Needles:  Injection technique: Single-shot  Needle Type: Echogenic Needle      Needle Gauge: 22 and 22 G    Additional Needles:  Procedures: ultrasound guided Supraclavicular block Narrative:  Start time: 08/06/2011 8:08 AM End time: 08/06/2011 8:19 AM  Performed by: Personally   Additional Notes: 25 cc 0.5% marcaine 1:200 epi injected with US guidance, anatomy well visualized.   Procedure Name: Intubation Date/Time: 08/06/2011 8:34 AM Performed by: Gwenyth Allegra Pre-anesthesia Checklist: Patient identified, Emergency Drugs available, Suction available, Patient being monitored and Timeout performed Patient Re-evaluated:Patient Re-evaluated prior to inductionOxygen Delivery Method: Circle System Utilized Preoxygenation: Pre-oxygenation with 100% oxygen Intubation Type: IV induction Ventilation: Mask ventilation without difficulty and Oral airway inserted - appropriate to patient size Laryngoscope Size: Mac and 3 Grade View: Grade II Tube type: Oral Tube size: 7.0 mm Number of attempts: 1 Secured at: 21 cm Tube secured with: Tape Dental Injury: Teeth and Oropharynx as per pre-operative assessment     Performed by: Brien Mates DOBSON    Anesthesia Regional Block:   Narrative:

## 2011-08-06 NOTE — Op Note (Signed)
[name], [name] NO.:  [id number]  MEDICAL RECORD NO.:  [id number]  LOCATION:  [facility]                         FACILITY:  [facility]  PHYSICIAN:  Madelynn Done, MD  DATE OF BIRTH:  May 04, 1956  DATE OF PROCEDURE:  08/06/2011 DATE OF DISCHARGE:                              OPERATIVE REPORT   PREOPERATIVE DIAGNOSIS:  Left elbow proximal radial head fracture.  POSTOPERATIVE DIAGNOSES:  Left elbow proximal radial head fracture and lateral ulnar collateral ligament tear as well as left elbow coronoid fracture.  ANESTHESIA:  General via endotracheal tube.  TOURNIQUET TIME:  83 minutes.  SURGICAL IMPLANTS:  One Biomet titanium 3-0 Anchor from a lateral ulnar collateral ligament, one 2.3 mm compression screw for the radial head.  SURGICAL PROCEDURES: 1. Open treatment of left elbow radial head fracture with internal     fixation. 2. Repair of left elbow lateral ulnar collateral ligament complex. 3. Open treatment of left elbow coronoid fracture without internal     fixation. 4. Radiographs, 3 views, left elbow.  SURGICAL INDICATIONS:  [name] is a 55 year old female who sustained a closed injury to her left elbow.  The patient was seen and evaluated in the office and based on degree of injury it is recommended that she undergo the above procedure.  Risks, benefits, and alternatives were discussed in detail with the patient and signed informed consent was obtained.  Risks include but not limited to bleeding, infection, damage to nearby nerves, arteries, or tendons, loss of motion of the elbow, wrist, and digits, nonunion, malunion, hardware failure, and need for further surgical intervention.  DESCRIPTION OF PROCEDURE:  The patient was properly identified in the preop holding area and a mark with permanent marker was made on the left elbow to indicate correct operative site.  The patient was brought back up to the operating room and placed supine on  anesthesia room table. General anesthesia was administered.  The patient received preop antibiotics prior to skin incision.  A well-padded tourniquet was placed on the left brachium and sealed with 1000 drapes.  Left upper extremity was prepped and draped in a normal sterile fashion.  A time-out was called, correct site was identified, and procedure was then begun. Attention was then turned to the left elbow where a curvilinear incision was made directly over the lateral aspect of the elbow.  Dissection carried down through the skin and subcutaneous tissue.  The extensor interval was then incised longitudinally and the capsule was then opened.  The lateral ulnar collateral ligament appeared to have peeled back off the lateral aspect of the distal humerus.  Following this, the joint was then thoroughly irrigated.  Loose fragments and cartilaginous fragments were then removed.  An open reduction was then carried out of the radial head fragment which was displaced inferiorly.  I was able to obtain the piece and reduced and hold it in place with a small towel clip.  Following this, two K-wires were then used to hold this in place. After appropriate position of the K-wires was confirmed, a 2.3-mm compression screw was then used with good purchase across the fracture fragment.  This was confirmed using mini C-arm.  After internal fixation of the  radial head, the Biomet anchor was then placed along the origin of lateral ulnar collateral ligament.  The ligament tissue was then brought up and then tied in a pants-over-vest type fashion to its site of origin.  The remaining extensor interval was then closed with the 2-0 Vicryl suture.  The wound was irrigated at all levels.  The coronoid fracture which was reviewed was not significantly displaced and open treatment was then carried out without internal fixation.  Thorough wound irrigation was done.  The tourniquet deflated with good perfusion of  the fingers.  The subcutaneous tissue was closed with 4-0 Vicryl and skin was closed with 4-0 Prolene simple sutures.  Adaptic dressing and sterile compressive bandage was applied.  The patient then placed in a long-arm splint, extubated, and taken to the recovery room in good condition.  Intraoperative radiographs 3 views of the elbow do show the internal fixation in place.  There was good position in both planes.  POSTOPERATIVE PLAN:  The patient will be admitted for IV antibiotics and pain control, discharged in the morning, seen back in the office in approximately 2 weeks for wound check, suture removal, and then begin a therapy regimen and again to work on mobility and use of her elbow, radiographs at each visit.     Madelynn Done, MD     FWO/MEDQ  D:  08/06/2011  T:  08/06/2011  Job:  [id number]

## 2011-08-06 NOTE — Preoperative (Signed)
Beta Blockers   Reason not to administer Beta Blockers:Not Applicable 

## 2011-08-06 NOTE — H&P (Signed)
[name] is an 55 y.o. female.   Chief Complaint: Fall on left elbow  HPI: Pt fell on outstretched left elbow and with displaced radial head fracture. Pt here for surgery. Pt seen in office and elects surgery. No prior surgery to elbow.  Past Medical History  Diagnosis Date  . Ovarian cyst   . Pneumonia     hx x4  . Cancer     precancerous cervix  . GERD (gastroesophageal reflux disease)   . Headache   . Hiatal hernia     Past Surgical History  Procedure Date  . Cesarean section 87  . Knee surgery 14    rt arthroscopy  . Ovarian cyst removal 80  . Abdominal hysterectomy 88    History reviewed. No pertinent family history. Social History:  reports that she has never smoked. She does not have any smokeless tobacco history on file. She reports that she drinks alcohol. She reports that she does not use illicit drugs.  Allergies:  Allergies  Allergen Reactions  . Meperidine Hcl Itching    REACTION: hives, itich    Medications Prior to Admission  Medication Dose Route Frequency Provider Last Rate Last Dose  . ceFAZolin (ANCEF) 2-3 GM-% IVPB SOLR           . ceFAZolin (ANCEF) IVPB 2 g/50 mL premix  2 g Intravenous 60 min Pre-Op Sharma Covert       Medications Prior to Admission  Medication Sig Dispense Refill  . DULoxetine (CYMBALTA) 30 MG capsule Take 30 mg by mouth 2 (two) times daily.        Marland Kitchen HYDROcodone-acetaminophen (NORCO) 5-325 MG per tablet Take 1 tablet by mouth every 6 (six) hours as needed. Pain.       Marland Kitchen HYDROcodone-acetaminophen (NORCO) 5-325 MG per tablet Take 1 tablet by mouth every 8 (eight) hours as needed. 10-325mg  q4-6 hrs       . ibuprofen (ADVIL,MOTRIN) 400 MG tablet Take 800 mg by mouth every 6 (six) hours as needed. Pain.       Marland Kitchen omeprazole (PRILOSEC) 20 MG capsule Take 20 mg by mouth 2 (two) times daily.       . pregabalin (LYRICA) 200 MG capsule Take 200 mg by mouth 2 (two) times daily.       . traZODone (DESYREL) 50 MG tablet Take 25  mg by mouth at bedtime.        . Pediatric Multivit-Minerals-C (FLINTSTONES GUMMIES PLUS PO) Take 1 tablet by mouth daily.          Results for orders placed during the hospital encounter of 08/05/11 (from the past 48 hour(s))  BASIC METABOLIC PANEL     Status: Abnormal   Collection Time   08/05/11  1:44 PM      Component Value Range Comment   Sodium 142  135 - 145 (mEq/L)    Potassium 4.4  3.5 - 5.1 (mEq/L)    Chloride 105  96 - 112 (mEq/L)    CO2 30  19 - 32 (mEq/L)    Glucose, Bld 178 (*) 70 - 99 (mg/dL)    BUN 13  6 - 23 (mg/dL)    Creatinine, Ser 7.25  0.50 - 1.10 (mg/dL)    Calcium 9.4  8.4 - 10.5 (mg/dL)    GFR calc non Af Amer >90  >90 (mL/min)    GFR calc Af Amer >90  >90 (mL/min)   CBC     Status: Abnormal   Collection Time  08/05/11  1:44 PM      Component Value Range Comment   WBC 4.2  4.0 - 10.5 (K/uL)    RBC 4.36  3.87 - 5.11 (MIL/uL)    Hemoglobin 10.3 (*) 12.0 - 15.0 (g/dL)    HCT 16.1 (*) 09.6 - 46.0 (%)    MCV 77.3 (*) 78.0 - 100.0 (fL)    MCH 23.6 (*) 26.0 - 34.0 (pg)    MCHC 30.6  30.0 - 36.0 (g/dL)    RDW 04.5 (*) 40.9 - 15.5 (%)    Platelets 234  150 - 400 (K/uL)   SURGICAL PCR SCREEN     Status: Normal   Collection Time   08/05/11  1:51 PM      Component Value Range Comment   MRSA, PCR NEGATIVE  NEGATIVE     Staphylococcus aureus NEGATIVE  NEGATIVE     Dg Chest 2 View  08/05/2011  *RADIOLOGY REPORT*  Clinical Data: Preop elbow fracture  CHEST - 2 VIEW  Comparison: 03/14/2010  Findings: The heart size and mediastinal contours are within normal limits.  Both lungs are clear.  The visualized skeletal structures are unremarkable.  IMPRESSION: Negative exam.  Original Report Authenticated By: Rosealee Albee, M.D.    No recent illnesses and hospitalizations  Blood pressure 114/72, pulse 107, temperature 98.3 F (36.8 C), temperature source Oral, resp. rate 18, SpO2 93.00%.  General Appearance:  Alert, cooperative, no distress, appears stated age    Head:  Normocephalic, without obvious abnormality, atraumatic  Eyes:  Pupils equal, conjunctiva/corneas clear,         Throat: Lips, mucosa, and tongue normal; teeth and gums normal  Neck: No visible masses     Lungs:   respirations unlabored  Chest Wall:  No tenderness or deformity  Heart:  Regular rate and   Abdomen:   Soft, non-tender        Extremities: LUE: skin in good condition. Limited elbow mobility, fingers warm well perfused good cap refil. Able to extend thumb and digits, able to cross fingers. Lacks full elbow and forearm rotation.  Pulses: 2+ and symmetric  Skin: Skin color, texture, turgor normal, no rashes or lesions     Neurologic: Normal     Assessment/Plan Left elbow displaced radial head fracture  TO OR today for orif of radial head possible arthroplasty possible fragment excision.  Full r/b/a discussed with patient and my office notes reflect further detailed conversations.  Admit for iv abx and pain control following surgery.  Sharma Covert 08/06/2011, 7:36 AM

## 2011-08-06 NOTE — Anesthesia Preprocedure Evaluation (Addendum)
Anesthesia Evaluation  Patient identified by MRN, date of birth, ID band Patient awake    Reviewed: Allergy & Precautions, H&P , NPO status   Airway Mallampati: II TM Distance: >3 FB Neck ROM: Full    Dental  (+) Teeth Intact   Pulmonary  clear to auscultation        Cardiovascular Regular Normal    Neuro/Psych    GI/Hepatic   Endo/Other    Renal/GU      Musculoskeletal   Abdominal   Peds  Hematology   Anesthesia Other Findings   Reproductive/Obstetrics                           Anesthesia Physical Anesthesia Plan  ASA: II  Anesthesia Plan: General   Post-op Pain Management:    Induction:   Airway Management Planned: Oral ETT  Additional Equipment:   Intra-op Plan:   Post-operative Plan: Extubation in OR  Informed Consent: I have reviewed the patients History and Physical, chart, labs and discussed the procedure including the risks, benefits and alternatives for the proposed anesthesia with the patient or authorized representative who has indicated his/her understanding and acceptance.   Dental advisory given  Plan Discussed with: CRNA and Surgeon  Anesthesia Plan Comments: (Plan Ga with pre-op supraclavicular block.  GERD Obesity H/O migraines)        Anesthesia Quick Evaluation

## 2011-08-07 ENCOUNTER — Other Ambulatory Visit: Payer: Self-pay

## 2011-08-07 ENCOUNTER — Encounter (HOSPITAL_COMMUNITY): Payer: Self-pay | Admitting: *Deleted

## 2011-08-07 ENCOUNTER — Other Ambulatory Visit (HOSPITAL_COMMUNITY): Payer: No Typology Code available for payment source

## 2011-08-07 ENCOUNTER — Encounter (HOSPITAL_COMMUNITY): Payer: Self-pay | Admitting: Emergency Medicine

## 2011-08-07 ENCOUNTER — Emergency Department (HOSPITAL_COMMUNITY): Payer: No Typology Code available for payment source

## 2011-08-07 ENCOUNTER — Inpatient Hospital Stay (HOSPITAL_COMMUNITY)
Admission: EM | Admit: 2011-08-07 | Discharge: 2011-08-31 | DRG: 004 | Disposition: A | Discharge status: Rehabilitation Facility | Payer: No Typology Code available for payment source | Attending: Internal Medicine | Admitting: Internal Medicine

## 2011-08-07 DIAGNOSIS — G9340 Encephalopathy, unspecified: Secondary | ICD-10-CM

## 2011-08-07 DIAGNOSIS — D649 Anemia, unspecified: Secondary | ICD-10-CM | POA: Diagnosis present

## 2011-08-07 DIAGNOSIS — R5381 Other malaise: Secondary | ICD-10-CM | POA: Diagnosis present

## 2011-08-07 DIAGNOSIS — F411 Generalized anxiety disorder: Secondary | ICD-10-CM | POA: Diagnosis not present

## 2011-08-07 DIAGNOSIS — Z930 Tracheostomy status: Secondary | ICD-10-CM

## 2011-08-07 DIAGNOSIS — M542 Cervicalgia: Secondary | ICD-10-CM

## 2011-08-07 DIAGNOSIS — S52123A Displaced fracture of head of unspecified radius, initial encounter for closed fracture: Secondary | ICD-10-CM

## 2011-08-07 DIAGNOSIS — E46 Unspecified protein-calorie malnutrition: Secondary | ICD-10-CM | POA: Diagnosis present

## 2011-08-07 DIAGNOSIS — M25519 Pain in unspecified shoulder: Secondary | ICD-10-CM

## 2011-08-07 DIAGNOSIS — E870 Hyperosmolality and hypernatremia: Secondary | ICD-10-CM | POA: Diagnosis not present

## 2011-08-07 DIAGNOSIS — J9690 Respiratory failure, unspecified, unspecified whether with hypoxia or hypercapnia: Secondary | ICD-10-CM

## 2011-08-07 DIAGNOSIS — J398 Other specified diseases of upper respiratory tract: Secondary | ICD-10-CM | POA: Diagnosis not present

## 2011-08-07 DIAGNOSIS — R061 Stridor: Secondary | ICD-10-CM | POA: Diagnosis present

## 2011-08-07 DIAGNOSIS — E876 Hypokalemia: Secondary | ICD-10-CM | POA: Diagnosis not present

## 2011-08-07 DIAGNOSIS — S42309D Unspecified fracture of shaft of humerus, unspecified arm, subsequent encounter for fracture with routine healing: Secondary | ICD-10-CM

## 2011-08-07 DIAGNOSIS — J9600 Acute respiratory failure, unspecified whether with hypoxia or hypercapnia: Secondary | ICD-10-CM

## 2011-08-07 DIAGNOSIS — IMO0002 Reserved for concepts with insufficient information to code with codable children: Secondary | ICD-10-CM | POA: Diagnosis present

## 2011-08-07 DIAGNOSIS — J019 Acute sinusitis, unspecified: Secondary | ICD-10-CM

## 2011-08-07 DIAGNOSIS — R4182 Altered mental status, unspecified: Secondary | ICD-10-CM | POA: Diagnosis present

## 2011-08-07 DIAGNOSIS — J690 Pneumonitis due to inhalation of food and vomit: Secondary | ICD-10-CM | POA: Diagnosis present

## 2011-08-07 DIAGNOSIS — F3289 Other specified depressive episodes: Secondary | ICD-10-CM | POA: Diagnosis present

## 2011-08-07 DIAGNOSIS — B961 Klebsiella pneumoniae [K. pneumoniae] as the cause of diseases classified elsewhere: Secondary | ICD-10-CM | POA: Diagnosis present

## 2011-08-07 DIAGNOSIS — J90 Pleural effusion, not elsewhere classified: Secondary | ICD-10-CM | POA: Diagnosis present

## 2011-08-07 DIAGNOSIS — M25559 Pain in unspecified hip: Secondary | ICD-10-CM

## 2011-08-07 DIAGNOSIS — R0902 Hypoxemia: Secondary | ICD-10-CM

## 2011-08-07 DIAGNOSIS — J384 Edema of larynx: Secondary | ICD-10-CM | POA: Diagnosis not present

## 2011-08-07 DIAGNOSIS — R509 Fever, unspecified: Secondary | ICD-10-CM | POA: Diagnosis not present

## 2011-08-07 DIAGNOSIS — F329 Major depressive disorder, single episode, unspecified: Secondary | ICD-10-CM

## 2011-08-07 HISTORY — DX: Pneumonitis due to inhalation of food and vomit: J69.0

## 2011-08-07 HISTORY — DX: Respiratory failure, unspecified, unspecified whether with hypoxia or hypercapnia: J96.90

## 2011-08-07 LAB — BASIC METABOLIC PANEL
BUN: 11 mg/dL (ref 6–23)
Calcium: 8.8 mg/dL (ref 8.4–10.5)
Creatinine, Ser: 0.87 mg/dL (ref 0.50–1.10)
GFR calc non Af Amer: 75 mL/min — ABNORMAL LOW (ref >90)
Glucose, Bld: 138 mg/dL — ABNORMAL HIGH (ref 70–99)
Sodium: 136 mEq/L (ref 135–145)

## 2011-08-07 LAB — CBC
HCT: 32.9 % — ABNORMAL LOW (ref 36.0–46.0)
Hemoglobin: 9.9 g/dL — ABNORMAL LOW (ref 12.0–15.0)
MCH: 24.1 pg — ABNORMAL LOW (ref 26.0–34.0)
MCHC: 30.1 g/dL (ref 30.0–36.0)
MCV: 80 fL (ref 78.0–100.0)

## 2011-08-07 LAB — BLOOD GAS, ARTERIAL
Drawn by: 313061
PEEP: 5 cmH2O
RATE: 16 resp/min
pCO2 arterial: 72.4 mmHg (ref 35.0–45.0)
pH, Arterial: 7.212 — ABNORMAL LOW (ref 7.350–7.400)
pO2, Arterial: 73.5 mmHg — ABNORMAL LOW (ref 80.0–100.0)

## 2011-08-07 LAB — RAPID URINE DRUG SCREEN, HOSP PERFORMED: Opiates: POSITIVE — AB

## 2011-08-07 LAB — ETHANOL: Alcohol, Ethyl (B): <11 mg/dL (ref 0–11)

## 2011-08-07 MED ORDER — ACETAMINOPHEN 325 MG RE SUPP
RECTAL | Status: AC
  Administered 2011-08-07: 975 mg
  Filled 2011-08-07: qty 3

## 2011-08-07 MED ORDER — LIDOCAINE HCL (CARDIAC) 20 MG/ML IV SOLN
INTRAVENOUS | Status: AC
  Filled 2011-08-07: qty 5

## 2011-08-07 MED ORDER — IPRATROPIUM BROMIDE 0.02 % IN SOLN
0.5000 mg | Freq: Once | RESPIRATORY_TRACT | Status: AC
  Administered 2011-08-07: 0.5 mg via RESPIRATORY_TRACT
  Filled 2011-08-07: qty 2.5

## 2011-08-07 MED ORDER — PROPOFOL 10 MG/ML IV EMUL
INTRAVENOUS | Status: AC
  Filled 2011-08-07: qty 20

## 2011-08-07 MED ORDER — ONDANSETRON HCL 4 MG/2ML IJ SOLN
INTRAMUSCULAR | Status: AC
  Filled 2011-08-07: qty 2

## 2011-08-07 MED ORDER — ETOMIDATE 2 MG/ML IV SOLN
INTRAVENOUS | Status: AC
  Filled 2011-08-07: qty 20

## 2011-08-07 MED ORDER — NALOXONE HCL 1 MG/ML IJ SOLN
INTRAMUSCULAR | Status: AC
  Filled 2011-08-07: qty 2

## 2011-08-07 MED ORDER — ALBUTEROL SULFATE (5 MG/ML) 0.5% IN NEBU
5.0000 mg | INHALATION_SOLUTION | Freq: Once | RESPIRATORY_TRACT | Status: AC
  Administered 2011-08-07: 5 mg via RESPIRATORY_TRACT
  Filled 2011-08-07: qty 1

## 2011-08-07 MED ORDER — SUCCINYLCHOLINE CHLORIDE 20 MG/ML IJ SOLN
INTRAMUSCULAR | Status: AC
  Filled 2011-08-07: qty 5

## 2011-08-07 MED ORDER — NALOXONE HCL 1 MG/ML IJ SOLN
INTRAMUSCULAR | Status: AC
  Filled 2011-08-07: qty 4

## 2011-08-07 MED ORDER — PROPOFOL 10 MG/ML IV EMUL
INTRAVENOUS | Status: AC
  Administered 2011-08-08: 30 ug/kg/min via INTRAVENOUS
  Filled 2011-08-07: qty 100

## 2011-08-07 MED ORDER — HYDROMORPHONE HCL 2 MG PO TABS: 2.0000 mg | ORAL_TABLET | ORAL | Status: AC | PRN

## 2011-08-07 MED ORDER — ROCURONIUM BROMIDE 50 MG/5ML IV SOLN
INTRAVENOUS | Status: AC
  Filled 2011-08-07: qty 2

## 2011-08-07 MED ORDER — CLINDAMYCIN PHOSPHATE 900 MG/50ML IV SOLN
900.0000 mg | Freq: Once | INTRAVENOUS | Status: AC
  Administered 2011-08-07: 900 mg via INTRAVENOUS
  Filled 2011-08-07: qty 50

## 2011-08-07 NOTE — ED Notes (Signed)
Decision made to intubate pt, pt agreeable. RT and MD at bedside.

## 2011-08-07 NOTE — ED Notes (Signed)
Pt arrived via EMS after being found by fiance lying on back with vomit in mouth, he rolled pt, noted to be "gurgling". Pt minimally responsive. EMS states pt apneic and unresponsive.

## 2011-08-07 NOTE — ED Notes (Signed)
Diprivan gtt started at d/t pt moving. Awaiting CCMD for CVC

## 2011-08-07 NOTE — Progress Notes (Signed)
Physical Therapy Evaluation Patient Details Name: Genelle Economou MRN: [id number] DOB: [birth year] Today's Date: 08/07/2011  Problem List:  Patient Active Problem List  Diagnoses  . ANXIETY  . DEPRESSION  . ACUTE SINUSITIS, UNSPECIFIED  . SHOULDER PAIN, CHRONIC  . HIP PAIN, CHRONIC  . NECK PAIN, CHRONIC  . Radial head fracture, closed    Past Medical History:  Past Medical History  Diagnosis Date  . Ovarian cyst   . Pneumonia     hx x4  . Cancer     precancerous cervix  . GERD (gastroesophageal reflux disease)   . Headache   . Hiatal hernia   . PONV (postoperative nausea and vomiting) 08/06/11   Past Surgical History:  Past Surgical History  Procedure Date  . Cesarean section 87  . Knee surgery 14    rt arthroscopy  . Ovarian cyst removal 80  . Abdominal hysterectomy 88    PT Assessment/Plan/Recommendation PT Assessment Clinical Impression Statement: pt very limited by L UE pain.  Feel with good pain control pt will be able to return to PLOF.   PT Recommendation/Assessment: Patient will need skilled PT in the acute care venue PT Problem List: Decreased activity tolerance;Decreased balance;Decreased mobility;Pain Barriers to Discharge: None PT Therapy Diagnosis : Difficulty walking;Acute pain PT Plan PT Frequency: Min 3X/week PT Treatment/Interventions: Gait training;Stair training;Functional mobility training;Therapeutic exercise;Balance training;Patient/family education PT Recommendation Recommendations for Other Services: OT consult Follow Up Recommendations: None Equipment Recommended: None recommended by PT PT Goals  Acute Rehab PT Goals PT Goal Formulation: With patient Time For Goal Achievement: 7 days Pt will go Supine/Side to Sit: Independently PT Goal: Supine/Side to Sit - Progress: Progressing toward goal Pt will Transfer Sit to Stand/Stand to Sit: Independently PT Transfer Goal: Sit to Stand/Stand to Sit - Progress: Progressing toward goal Pt  will Ambulate: >150 feet;Independently PT Goal: Ambulate - Progress: Progressing toward goal Pt will Go Up / Down Stairs: Flight;with least restrictive assistive device PT Goal: Up/Down Stairs - Progress: Not met  PT Evaluation Precautions/Restrictions  Precautions Precautions: Fall Restrictions Weight Bearing Restrictions: Yes LUE Weight Bearing: Non weight bearing Prior Functioning  Home Living Lives With: Spouse;Family Receives Help From: Family Type of Home: House Home Layout: Two level;1/2 bath on main level Prior Function Level of Independence: Independent with basic ADLs;Independent with homemaking with ambulation;Independent with homemaking with wheelchair;Independent with gait Able to Take Stairs?: Reciprically Driving: Yes Cognition Cognition Orientation Level: Oriented X4 Sensation/Coordination   Extremity Assessment RLE Assessment RLE Assessment: Within Functional Limits LLE Assessment LLE Assessment: Within Functional Limits Mobility (including Balance) Bed Mobility Bed Mobility: Yes Supine to Sit: 6: Modified independent (Device/Increase time);With rails Sitting - Scoot to Edge of Bed: 7: Independent Transfers Transfers: Yes Sit to Stand: 5: Supervision;With upper extremity assist;From bed Stand to Sit: 5: Supervision;With upper extremity assist;To chair/3-in-1 Stand to Sit Details: cues to control descent Ambulation/Gait Ambulation/Gait: Yes Ambulation/Gait Assistance: 4: Min assist Ambulation Distance (Feet): 15 Feet Assistive device: None Gait Pattern: Shuffle Stairs: No    Exercise    End of Session PT - End of Session Equipment Utilized During Treatment: Gait belt (Sling) Activity Tolerance: Patient limited by pain Patient left: in chair;with call bell in reach Nurse Communication: Mobility status for transfers;Mobility status for ambulation General Behavior During Session: [facility] for tasks performed Cognition: [facility] for tasks  performed  Sunny Schlein, [place] [phone] 08/07/2011, 12:23 PM

## 2011-08-07 NOTE — Progress Notes (Signed)
Subjective: 1 Day Post-Op Procedure(s) (LRB): OPEN REDUCTION INTERNAL FIXATION (ORIF) ELBOW/OLECRANON FRACTURE (Left) Patient reports pain as 6 on 0-10 scale.    Objective: Vital signs in last 24 hours: Temp:  [97.3 F (36.3 C)-98.7 F (37.1 C)] 98.7 F (37.1 C) (11/25 0742) Pulse Rate:  [65-145] 65  (11/25 0742) Resp:  [8-25] 18  (11/25 0742) BP: (102-133)/(55-73) 102/55 mmHg (11/25 0742) SpO2:  [60 %-100 %] 96 % (11/25 0742)  Intake/Output from previous day: 11/24 0701 - 11/25 0700 In: 2988.9 [P.O.:480; I.V.:2458.9; IV Piggyback:50] Out: 0  Intake/Output this shift:     Basename 08/05/11 1344  HGB 10.3*    Basename 08/05/11 1344  WBC 4.2  RBC 4.36  HCT 33.7*  PLT 234    Basename 08/05/11 1344  NA 142  K 4.4  CL 105  CO2 30  BUN 13  CREATININE 0.77  GLUCOSE 178*  CALCIUM 9.4   No results found for this basename: LABPT:2,INR:2 in the last 72 hours  General appearance: alert  Neurologically intact Assessment/Plan: 1 Day Post-Op ORIF OF LEFT DISTAL RADIUS :Up with therapy Likely discharge with home health, case management consult  Sharma Covert 08/07/2011, 9:48 AM

## 2011-08-07 NOTE — ED Notes (Signed)
ZOX:WRUE<AV> Expected date:08/07/11<BR> Expected time: 8:06 PM<BR> Means of arrival:<BR> Comments:<BR> OD

## 2011-08-07 NOTE — ED Notes (Signed)
Pt placed on bipap, multiple IV access attempts made by EDP and RN staff. Pt is becoming very tired appearing. More difficulty to arouse.

## 2011-08-07 NOTE — ED Notes (Signed)
Pt found to be febrile per temp probe foley 103.5, MD notified, tylenol 975mg  PR given.

## 2011-08-07 NOTE — ED Notes (Signed)
Per EMS pt recently released from [facility] for elbow sx. Pt takes mulitple medications for pain control and sleep. EMS report pt to be unconscious on arrival. Now awake after Narcon 2mg  IV. #18 R AC.

## 2011-08-07 NOTE — ED Notes (Signed)
Per Dr. Patria Mane: attempted BiPAP on various settings 18/6 ending.  Albuterol/Atrovent neb given via BiPAP.  Pt respiratory rate remained in the high 30s to mid 50s.  SpO2 came up to high 90s. Pt tolerated fairly well but did not find relief.

## 2011-08-07 NOTE — H&P (Signed)
Admission Note - Critical Care Medicine Note  Patient Details:    [name] is an 55 y.o. female with who had recent left elbow surgery. She was discharged home with pain medications post operatively. Her fiance witnessed her take a pain pill and a sleeping pill.  Several hours later he was alerted to her moaning and gurgling.  He found her minimally responsive on her back in bed and appeared to have vomited.  He turned her to her side and cleared vomitus from her mouth.  He called for EMS and she received narcan with improvement in her mental status and was transported to the ED.  Due to persistent hypoxia despite nonrebreather and increased work of breathing she was electively intubated in the ED.  PMH: Ovarian Cyst H/o PNA GERD Chronic LBP  Medications Cymbalta 30 gm daily Norco prn Ibuprofen prn Omeprazole 20 mg daily Lyrica 200 mg daily trazadone 25 mg QHS MVI  HS: Non-smoker. Currently on medical leave. Lives with fiance and grandson.  FH: Unable to obtain from patient due to sedation.  ROS: Unable to obatain as patient is intubated.   Lines/tubes : AIRWAYS 7.5 mm (Active)     Urethral Catheter Latex;Straight-tip;Temperature probe 14 Fr. (Active)    Microbiology/Sepsis markers: Results for orders placed during the hospital encounter of 08/05/11  SURGICAL PCR SCREEN     Status: Normal   Collection Time   08/05/11  1:51 PM      Component Value Range Status Comment   MRSA, PCR NEGATIVE  NEGATIVE  Final    Staphylococcus aureus NEGATIVE  NEGATIVE  Final     Anti-infectives:  Anti-infectives     Start     Dose/Rate Route Frequency Ordered Stop   08/07/11 2330   clindamycin (CLEOCIN) IVPB 900 mg        900 mg 100 mL/hr over 30 Minutes Intravenous  Once 08/07/11 2241            Best Practice/Protocols:  VTE Prophylaxis: Lovenox (prophylaxtic dose) GI Prophylaxis: Proton Pump Inhibitor Continous Sedation ARDS  Consults:    None   Studies: Dg Chest 2 View  08/05/2011  *RADIOLOGY REPORT*  Clinical Data: Preop elbow fracture  CHEST - 2 VIEW  Comparison: 03/14/2010  Findings: The heart size and mediastinal contours are within normal limits.  Both lungs are clear.  The visualized skeletal structures are unremarkable.  IMPRESSION: Negative exam.  Original Report Authenticated By: Rosealee Albee, M.D.   Dg Elbow Complete Left  07/30/2011  *RADIOLOGY REPORT*  Clinical Data: Fall  LEFT ELBOW - COMPLETE 3+ VIEW  Comparison: None.  Findings: Fracture of the radial head with displacement of the fracture fragment.  The ulna is intact.  No dislocation.  IMPRESSION: Displaced fracture of the radial head.  Original Report Authenticated By: Camelia Phenes, M.D.   Dg Wrist Complete Left  07/30/2011  *RADIOLOGY REPORT*  Clinical Data: Fall, left wrist pain  LEFT WRIST - COMPLETE 3+ VIEW  Comparison: None.  Findings: No fracture or dislocation.  No soft tissue abnormality. No radiopaque foreign body.  IMPRESSION: No acute osseous abnormality.  Original Report Authenticated By: Harrel Lemon, M.D.   Dg Wrist Complete Right  07/30/2011  *RADIOLOGY REPORT*  Clinical Data: Fall, right wrist pain  RIGHT WRIST - COMPLETE 3+ VIEW  Comparison: None.  Findings: No fracture or dislocation.  No soft tissue abnormality. No radiopaque foreign body.  IMPRESSION: No acute osseous abnormality.  Original Report Authenticated By: Harrel Lemon,  M.D.   Dg Ankle Complete Left  07/30/2011  *RADIOLOGY REPORT*  Clinical Data: Fall, lateral ankle pain  LEFT ANKLE COMPLETE - 3+ VIEW  Comparison: None.  Findings: Nondisplaced distal left fibular fracture noted with mild overlying soft tissue swelling.  The mortise is symmetric.  No radiopaque foreign body.  Plantar calcaneal spurring noted.  IMPRESSION: Nondisplaced lateral malleolar/distal fibular fracture.  Original Report Authenticated By: Harrel Lemon, M.D.   Dg Ankle Complete  Right  07/30/2011  *RADIOLOGY REPORT*  Clinical Data: Fall.  Ankle pain  RIGHT ANKLE - COMPLETE 3+ VIEW  Comparison: none  Findings: Normal alignment and no fracture.  Negative for joint effusion.  Negative for arthropathy.  IMPRESSION: Negative  Original Report Authenticated By: Camelia Phenes, M.D.   Ct Elbow Left W/o Cm  08/02/2011  *RADIOLOGY REPORT*  Clinical Data: Elbow injury 5 days ago.  Evaluate proximal radial fracture.  CT OF THE LEFT ELBOW WITHOUT CONTRAST  Technique:  Multidetector CT imaging was performed according to the standard protocol. Multiplanar CT image reconstructions were also generated.  Comparison: Radiographs 07/30/2011.  Findings: There is a comminuted fracture involving the articular surface of the radial head anteriorly.  A component of the articular surface is displaced approximately 5 mm distally.  Small fracture fragments are noted posteriorly within the radial capitellar articulation.  There is no subluxation and capitellum appears intact.  There is a nondisplaced fracture involving the coronoid process of the proximal ulna.  The proximal ulna is otherwise intact.  The distal humerus appears intact.  There is a moderate to large elbow joint effusion.  As evaluated by CT, the biceps, brachialis and triceps tendons are intact.  No ulnar nerve abnormality is identified.  IMPRESSION:  1.  Mildly displaced and comminuted intra-articular fracture of the radial head as described. 2.  Nondisplaced fracture of the coronoid process. 3.  Large elbow hemarthrosis.  Original Report Authenticated By: Gerrianne Scale, M.D.   Dg Chest Portable 1 View  08/07/2011  *RADIOLOGY REPORT*  Clinical Data: Status post intubation.  PORTABLE CHEST - 1 VIEW  Comparison: None.  Findings: The patient's endotracheal tube is noted ending within the right mainstem bronchus, 1 cm deep to the carina.  This should be retracted 3-4 cm.  The lungs are hypoexpanded.  Sudden onset of diffuse right-sided  airspace opacification likely reflects some degree of atelectasis. Underlying interstitial prominence is noted, with vascular congestion and vascular crowding, likely reflecting pulmonary edema.  No definite pleural effusion or pneumothorax is seen.  The cardiomediastinal silhouette is borderline enlarged.  No acute osseous abnormalities are identified.  IMPRESSION:  1.  Endotracheal tube noted ending within the right mainstem bronchus, 1 cm deep to the carina; this should be retracted 3-4 cm. 2.  Lungs hypoexpanded; sudden onset of diffuse right-sided airspace opacification likely reflects some degree of atelectasis. Underlying pulmonary edema again noted, with vascular congestion and borderline cardiomegaly.  Findings were discussed with Dr. Azalia Bilis at 10:48 p.m. on 08/07/2011.  Original Report Authenticated By: Tonia Ghent, M.D.   Dg Chest Portable 1 View  08/07/2011  *RADIOLOGY REPORT*  Clinical Data: Hypoxia and shortness of breath.  PORTABLE CHEST - 1 VIEW  Comparison: 08/05/2011  Findings: Diffuse bronchial and pulmonary vascular prominence present.  Findings are most likely consistent with congestive heart failure.  No pleural effusions identified.  Heart size is stable.  IMPRESSION: Findings likely reflective of congestive heart failure.  Original Report Authenticated By: Reola Calkins, M.D.   Dg  Shoulder Left  07/30/2011  *RADIOLOGY REPORT*  Clinical Data: Fall.  Shoulder injury and pain.  LEFT SHOULDER - 2+ VIEW  Comparison:  None.  Findings:  There is no evidence of fracture or dislocation.  There is no evidence of arthropathy or other focal bone abnormality. Soft tissues are unremarkable.  IMPRESSION: Negative.  Original Report Authenticated By: Danae Orleans, M.D.   Dg Humerus Left  07/30/2011  *RADIOLOGY REPORT*  Clinical Data: Fall.  Left arm injury and pain.  LEFT HUMERUS - 2+ VIEW  Comparison: None.  Findings: No evidence of fracture or other bone lesion.  Soft tissues are  unremarkable.  IMPRESSION: Negative.  Original Report Authenticated By: Danae Orleans, M.D.     Events:  Subjective:    Overnight Issues:   Objective:  Vital signs for last 24 hours: Temp:  [97.8 F (36.6 C)-103.5 F (39.7 C)] 102.7 F (39.3 C) (11/25 2315) Pulse Rate:  [65-144] 140  (11/25 2315) Resp:  [16-32] 28  (11/25 2315) BP: (102-171)/(55-95) 116/59 mmHg (11/25 2315) SpO2:  [72 %-100 %] 95 % (11/25 2315) FiO2 (%):  [100 %] 100 % (11/25 2159)  Hemodynamic parameters for last 24 hours:    Intake/Output from previous day:    Intake/Output this shift:    Vent settings for last 24 hours: Vent Mode:  [-] PRVC FiO2 (%):  [100 %] 100 % Set Rate:  [16 bmp-22 bmp] 22 bmp Vt Set:  [380 mL-420 mL] 420 mL PEEP:  [5 cmH20-10 cmH20] 10 cmH20 Plateau Pressure:  [16 cmH20] 16 cmH20  Physical Exam:  General: alert, no respiratory distress and sedated and intubated Neuro: nonfocal exam and sedated.  Moves all 4 extremities spontaneously. HEENT/Neck: no JVD, ETT WNL , PERRL and ETT Resp: clear to auscultation bilaterally CVS: Tachy, regular, no m/r/g GI: soft, nontender, BS WNL, no r/g Skin: no rash Extremities: Wrapped left arm, L>R LE edema.  Results for orders placed during the hospital encounter of 08/07/11 (from the past 24 hour(s))  CBC     Status: Abnormal   Collection Time   08/07/11  8:42 PM      Component Value Range   WBC 5.9  4.0 - 10.5 (K/uL)   RBC 4.11  3.87 - 5.11 (MIL/uL)   Hemoglobin 9.9 (*) 12.0 - 15.0 (g/dL)   HCT 16.1 (*) 09.6 - 46.0 (%)   MCV 80.0  78.0 - 100.0 (fL)   MCH 24.1 (*) 26.0 - 34.0 (pg)   MCHC 30.1  30.0 - 36.0 (g/dL)   RDW 04.5 (*) 40.9 - 15.5 (%)   Platelets 222  150 - 400 (K/uL)  BASIC METABOLIC PANEL     Status: Abnormal   Collection Time   08/07/11  8:42 PM      Component Value Range   Sodium 136  135 - 145 (mEq/L)   Potassium 4.5  3.5 - 5.1 (mEq/L)   Chloride 100  96 - 112 (mEq/L)   CO2 25  19 - 32 (mEq/L)   Glucose, Bld  138 (*) 70 - 99 (mg/dL)   BUN 11  6 - 23 (mg/dL)   Creatinine, Ser 8.11  0.50 - 1.10 (mg/dL)   Calcium 8.8  8.4 - 91.4 (mg/dL)   GFR calc non Af Amer 75 (*) >90 (mL/min)   GFR calc Af Amer 87 (*) >90 (mL/min)  TROPONIN I     Status: Normal   Collection Time   08/07/11  8:42 PM  Component Value Range   Troponin I <0.30  <0.30 (ng/mL)  ETHANOL     Status: Normal   Collection Time   08/07/11  8:42 PM      Component Value Range   Alcohol, Ethyl (B) <11  0 - 11 (mg/dL)  BLOOD GAS, ARTERIAL     Status: Abnormal   Collection Time   08/07/11 10:29 PM      Component Value Range   FIO2 1.00     Delivery systems VENTILATOR     Mode PRESSURE REGULATED VOLUME CONTROL     VT 380     Rate 16     Peep/cpap 5.0     pH, Arterial 7.212 (*) 7.350 - 7.400    pCO2 arterial 72.4 (*) 35.0 - 45.0 (mmHg)   pO2, Arterial 73.5 (*) 80.0 - 100.0 (mmHg)   Bicarbonate 28.0 (*) 20.0 - 24.0 (mEq/L)   TCO2 27.0  0 - 100 (mmol/L)   Acid-base deficit 0.6  0.0 - 2.0 (mmol/L)   O2 Saturation 90.6     Patient temperature 98.6     Collection site BRACHIAL ARTERY     Drawn by [id number]     Sample type ARTERIAL DRAW     Allens test (pass/fail) PASS  PASS   URINE RAPID DRUG SCREEN (HOSP PERFORMED)     Status: Abnormal   Collection Time   08/07/11 10:45 PM      Component Value Range   Opiates POSITIVE (*) NONE DETECTED    Cocaine NONE DETECTED  NONE DETECTED    Benzodiazepines POSITIVE (*) NONE DETECTED    Amphetamines NONE DETECTED  NONE DETECTED    Tetrahydrocannabinol NONE DETECTED  NONE DETECTED    Barbiturates NONE DETECTED  NONE DETECTED      Assessment/Plan:   NEURO  Altered Mental Status:  change in mental status due to medication   Plan: Provide sedation now while requiring high vent support - Propofol and fentanyl  PULM  Hypoxemia likely 2/2 aspiration event.  PE also possible given recent orthopedic procedure and bone fractures.    Plan: - Vent support per ARDS protocol.   - Cover  aspiration PNA with Unasyn. - CTA to r/o PE  CARDIO  Sinus Tachycardia 22 acute critical illness.   Plan: Supportive care as above.   R/o PE as etiology.  RENAL  Stable   Plan: IVF at 100/hr  GI  Stable   Plan: Nutrition c/s for tube feeds  ID  Pneumonia (aspiration)   Plan: Clindamycin in ED.  Will change cover to Unasyn. - Blood cultures - Sputum culture  HEME  N/A   Plan: N/A  ENDO N/A   Plan: N/A  Global Issues      LOS: 0 days   Additional comments:I personally reviewed lab results and chest x-rays.  Critical Care Total Time*: 1 Hour 15 Minutes  Izabela Ow 08/07/2011  *Care during the described time interval was provided by me and/or other providers on the critical care team.  I have reviewed this patient's available data, including medical history, events of note, physical examination and test results as part of my evaluation.

## 2011-08-07 NOTE — ED Notes (Signed)
Vent settings 10peep, RR 22, 100%, 380TV, ETT pulled back to 20cm by RT

## 2011-08-07 NOTE — ED Notes (Signed)
#  14 temp probe cath inserted, #14 OG placed.

## 2011-08-07 NOTE — ED Notes (Signed)
CCMD at bedside, central line setup

## 2011-08-07 NOTE — ED Provider Notes (Addendum)
History     CSN: [id number] Arrival date & time: 08/07/2011  8:13 PM   First MD Initiated Contact with Patient 08/07/11 2021      Chief Complaint  Patient presents with  . Drug Overdose    (Consider location/radiation/quality/duration/timing/severity/associated sxs/prior treatment) The history is provided by the patient, the EMS personnel and the spouse. The history is limited by the condition of the patient.   the patient is status post left elbow surgery several days ago.  She was discharged home and today took a pain pill as well as a sleeping pill and then was found unresponsive by her husband.  EMS was called.  EMS reports on arrival the patient was covered in vomit and was unresponsive.  Should pinpoint pupils at that time.  They gave her 2 mg of IV Narcan with improvement in her respiratory status and her alertness.  She was transported to the emergency department emergently.  On arrival to the ER her O2 sats were 50% on room and she was placed on a nonrebreather.  Original mask increased her 2 saturations up to approximately 88 to 89%,  she was breathing approximately 45 times a minute.  Family reports otherwise she was doing well for the last several days.  Denies unilateral leg swelling.  No history of DVT or PE.  No prior pulmonary history.   Past Medical History  Diagnosis Date  . Ovarian cyst   . Pneumonia     hx x4  . Cancer     precancerous cervix  . GERD (gastroesophageal reflux disease)   . Headache   . Hiatal hernia   . PONV (postoperative nausea and vomiting) 08/06/11  . Difficult intubation     Past Surgical History  Procedure Date  . Cesarean section 85  . Knee surgery 14    rt arthroscopy  . Ovarian cyst removal 80  . Abdominal hysterectomy 88    History reviewed. No pertinent family history.  History  Substance Use Topics  . Smoking status: Never Smoker   . Smokeless tobacco: Not on file  . Alcohol Use: Yes    OB History    Grav Para Term  Preterm Abortions TAB SAB Ect Mult Living                  Review of Systems  Unable to perform ROS   Allergies  Meperidine hcl  Home Medications   Current Outpatient Rx  Name Route Sig Dispense Refill  . DOCUSATE SODIUM 100 MG PO CAPS Oral Take 1 capsule (100 mg total) by mouth 2 (two) times daily. 30 capsule 0  . DULOXETINE HCL 30 MG PO CPEP Oral Take 30 mg by mouth daily.     Marland Kitchen HYDROMORPHONE HCL 2 MG PO TABS Oral Take 1 tablet (2 mg total) by mouth every 4 (four) hours as needed for pain (DO NOT TAKE WITH OTHER NARCOTICS TO INCLUDE NORCO). 30 tablet 0  . IBUPROFEN 800 MG PO TABS Oral Take 400-800 mg by mouth 2 (two) times daily as needed. For pain.     Marland Kitchen METHOCARBAMOL 500 MG PO TABS Oral Take 1 tablet (500 mg total) by mouth 4 (four) times daily. 30 tablet 0  . NALOXONE HCL 1 MG/ML IJ SOLN Intravenous Inject 2 mg into the vein once.      . OMEPRAZOLE 20 MG PO CPDR Oral Take 20 mg by mouth 3 (three) times daily as needed. For indigestion.    Marland Kitchen ONDANSETRON HCL 2 MG/ML  IJ SOLN Intravenous Inject 4 mg into the vein once.      Marland Kitchen FLINTSTONES GUMMIES PLUS PO Oral Take 1 tablet by mouth daily.      Marland Kitchen PREGABALIN 100 MG PO CAPS Oral Take 200 mg by mouth 2 (two) times daily.      . SUMATRIPTAN SUCCINATE 100 MG PO TABS Oral Take 100 mg by mouth every 2 (two) hours as needed.      . TRAZODONE HCL 50 MG PO TABS Oral Take 25-50 mg by mouth at bedtime as needed. For sleep.    Marland Kitchen HYDROCODONE-ACETAMINOPHEN 5-325 MG PO TABS Oral Take 2 tablets by mouth every 6 (six) hours as needed. For pain.      BP 116/59  Pulse 140  Temp(Src) 102.7 F (39.3 C) (Oral)  Resp 28  Ht 5\' 1"  (1.549 m)  SpO2 95%  Physical Exam  Nursing note and vitals reviewed. Constitutional: She is oriented to person, place, and time. She appears well-developed and well-nourished. No distress.  HENT:  Head: Normocephalic and atraumatic.  Eyes: EOM are normal.  Neck: Normal range of motion.  Cardiovascular: Regular rhythm  and normal heart sounds.        Tachycardic  Pulmonary/Chest: Accessory muscle usage present. She is in respiratory distress. She has rhonchi. She has rales.  Abdominal: Soft. She exhibits no distension. There is no tenderness.  Musculoskeletal: Normal range of motion.       Lower extremities are symmetrical without edema or unilateral leg swelling.  Left upper arm is in a splint and a sling.  Normal-appearing right upper  Neurological: She is alert and oriented to person, place, and time.  Skin: Skin is warm and dry.  Psychiatric: She has a normal mood and affect. Judgment normal.    ED Course  INTUBATION Performed by: Lyanne Co Authorized by: Lyanne Co Consent: Verbal consent obtained. Consent given by: spouse Required items: required blood products, implants, devices, and special equipment available Patient identity confirmed: arm band Time out: Immediately prior to procedure a "time out" was called to verify the correct patient, procedure, equipment, support staff and site/side marked as required. Indications: respiratory distress, airway protection, hypoxemia and hypercapnia Intubation method: video-assisted Patient status: paralyzed (RSI) Preoxygenation: BVM Sedatives: etomidate Paralytic: succinylcholine Tube size: 7.5 mm Tube type: cuffed Number of attempts: 5 or more Ventilation between attempts: BVM Cricoid pressure: yes Cords visualized: yes Post-procedure assessment: chest rise and ETCO2 monitor Breath sounds: equal and absent over the epigastrium Cuff inflated: yes ETT to lip: 23 cm Tube secured with: ETT holder Chest x-ray interpreted by radiologist. Chest x-ray findings: endotracheal tube too low Tube repositioned: tube repositioned successfully Patient tolerance: Patient tolerated the procedure well with no immediate complications. Comments: No cardiovascular compromise during intubation attempts   CRITICAL CARE Performed by: Lyanne Co   Total critical care time: 40  Critical care time was exclusive of separately billable procedures and treating other patients.  Critical care was necessary to treat or prevent imminent or life-threatening deterioration.  Critical care was time spent personally by me on the following activities: development of treatment plan with patient and/or surrogate as well as nursing, discussions with consultants, evaluation of patient's response to treatment, examination of patient, obtaining history from patient or surrogate, ordering and performing treatments and interventions, ordering and review of laboratory studies, ordering and review of radiographic studies, pulse oximetry and re-evaluation of patient's condition.   Date: 08/08/2011  Rate: 140  Rhythm: sinus tachycardia  QRS  Axis: normal  Intervals: normal  ST/T Wave abnormalities: normal  Conduction Disutrbances:none  Narrative Interpretation:   Old EKG Reviewed: No significant changes noted      (including critical care time) Labs Reviewed  CBC - Abnormal; Notable for the following:    Hemoglobin 9.9 (*)    HCT 32.9 (*)    MCH 24.1 (*)    RDW 18.4 (*)    All other components within normal limits  BASIC METABOLIC PANEL - Abnormal; Notable for the following:    Glucose, Bld 138 (*)    GFR calc non Af Amer 75 (*)    GFR calc Af Amer 87 (*)    All other components within normal limits  BLOOD GAS, ARTERIAL - Abnormal; Notable for the following:    pH, Arterial 7.212 (*)    pCO2 arterial 72.4 (*)    pO2, Arterial 73.5 (*)    Bicarbonate 28.0 (*)    All other components within normal limits  URINE RAPID DRUG SCREEN (HOSP PERFORMED) - Abnormal; Notable for the following:    Opiates POSITIVE (*)    Benzodiazepines POSITIVE (*)    All other components within normal limits  TROPONIN I  ETHANOL   Dg Chest Portable 1 View  08/07/2011  *RADIOLOGY REPORT*  Clinical Data: Status post intubation.  PORTABLE CHEST - 1 VIEW   Comparison: None.  Findings: The patient's endotracheal tube is noted ending within the right mainstem bronchus, 1 cm deep to the carina.  This should be retracted 3-4 cm.  The lungs are hypoexpanded.  Sudden onset of diffuse right-sided airspace opacification likely reflects some degree of atelectasis. Underlying interstitial prominence is noted, with vascular congestion and vascular crowding, likely reflecting pulmonary edema.  No definite pleural effusion or pneumothorax is seen.  The cardiomediastinal silhouette is borderline enlarged.  No acute osseous abnormalities are identified.  IMPRESSION:  1.  Endotracheal tube noted ending within the right mainstem bronchus, 1 cm deep to the carina; this should be retracted 3-4 cm. 2.  Lungs hypoexpanded; sudden onset of diffuse right-sided airspace opacification likely reflects some degree of atelectasis. Underlying pulmonary edema again noted, with vascular congestion and borderline cardiomegaly.  Findings were discussed with Dr. Azalia Bilis at 10:48 p.m. on 08/07/2011.  Original Report Authenticated By: Tonia Ghent, M.D.   Dg Chest Portable 1 View  08/07/2011  *RADIOLOGY REPORT*  Clinical Data: Hypoxia and shortness of breath.  PORTABLE CHEST - 1 VIEW  Comparison: 08/05/2011  Findings: Diffuse bronchial and pulmonary vascular prominence present.  Findings are most likely consistent with congestive heart failure.  No pleural effusions identified.  Heart size is stable.  IMPRESSION: Findings likely reflective of congestive heart failure.  Original Report Authenticated By: Reola Calkins, M.D.     1. Respiratory failure   2. Hypoxia   3. Aspiration pneumonia       MDM  The patient presents with what sounds like an aspiration event.  The patient was profoundly hypoxic on arrival as tachycardic as well.  Consideration for pulmonary embolism was made however despite multiple attempts at peripheral guided IV access I was unable to obtain adequate IV  size for a CT angiogram.  The patient was attempted on BiPAP for her respiratory distress however after approximately 30 minutes of BiPAP it was deemed the patient would benefit from elective intubation.  I discussed this with the patient who is able to nod her head yes and I discussed this with the husband as well who was in  agreement.  At the time of intubation the patient was breathing approximately 50 times a minute her O2 sats are 90% on nonrebreather.  She was placed on 15 L nasal cannula to help with passive oxygenation during intubation.  The patient was initially intubated with etomidate and succinyl choline.  Visualization was required scope laryngoscopy was made however unable to intubate the cords.  The patient was bagged with oral and nasal airways and place.  Several attempts were made at direct laryngoscopy with maxillary and MAC 4 blade as well as bougie placement and blunt bougie placement.  A Tresa Endo was present in the room and he attempted one pass with a MAC 4 blade was unable to visualize cords.  The patient was able to be bagged after every attempt and her O2 sats room to be brought back up to the mid 80s.  She had no aspiration events during the multiple attempts.  Eventually I was able to pass a 7.5 endotracheal tube under direct visualization was quite scope laryngoscopy.  With intubation her O2 sats were brought up to 99% her PEEP was increased to 10 her tachycardia resolved.  Critical care was involved.  PCCM will admit the patient to the intensive care unit this time.  The patient developed a rectal temp of 103 in the emergency department.  Blood cultures urine cultures and IV clindamycin was initiated for suspected aspiration pneumonia.  I think there still does be a strong consideration for possible pulmonary embolism given her recent surgery and her hypoxia and tachycardia.  I have involved the critical care team in this decision and will leave the decision to anticoagulate up to them.   The family has been updated        Lyanne Co, MD 08/08/11 1610  Lyanne Co, MD 08/08/11 [phone]

## 2011-08-07 NOTE — ED Notes (Signed)
Multiple attempts made by MD and RNs for IV access. # 22 to R wrist obtained

## 2011-08-07 NOTE — ED Notes (Signed)
Pt talking, agreeable to elective intubation, Dr. Patria Mane and RT at bedside.  2135-Etomidate 25mg  given IVP 2136 Succs 100mg  given IVP 2140 2 unsuccessive intubation attempts. Pt being ventilated via BVM at this time, oral and nasal airway placed by Dr. Patria Mane 2147 pt moving, coughing. Etomidate 15mg  IVP and Succs 100mg  IVP given. 2149 Rocuronium 70mg  given 2152 Pt being ventilated per BVM  2154 Dr. Juleen China attempting intubation.  unsuccessfull 2157 Dr. Patria Mane attempting again with [facility]. 2159 pt intubated successfully by Dr. Patria Mane 7.5 ETT, 23cm at lip. Pos color change + breath sounds bilat.  2210 PCXR done

## 2011-08-08 ENCOUNTER — Inpatient Hospital Stay (HOSPITAL_COMMUNITY): Payer: No Typology Code available for payment source

## 2011-08-08 ENCOUNTER — Emergency Department (HOSPITAL_COMMUNITY): Payer: No Typology Code available for payment source

## 2011-08-08 ENCOUNTER — Encounter (HOSPITAL_COMMUNITY): Payer: Self-pay | Admitting: *Deleted

## 2011-08-08 DIAGNOSIS — M7989 Other specified soft tissue disorders: Secondary | ICD-10-CM

## 2011-08-08 DIAGNOSIS — I509 Heart failure, unspecified: Secondary | ICD-10-CM

## 2011-08-08 LAB — CBC
HCT: 28.7 % — ABNORMAL LOW (ref 36.0–46.0)
Hemoglobin: 8.5 g/dL — ABNORMAL LOW (ref 12.0–15.0)
MCH: 23.4 pg — ABNORMAL LOW (ref 26.0–34.0)
MCHC: 29.6 g/dL — ABNORMAL LOW (ref 30.0–36.0)
MCV: 79.1 fL (ref 78.0–100.0)

## 2011-08-08 LAB — MRSA PCR SCREENING: MRSA by PCR: NEGATIVE

## 2011-08-08 LAB — BLOOD GAS, ARTERIAL
Acid-Base Excess: 2.3 mmol/L — ABNORMAL HIGH (ref 0.0–2.0)
Bicarbonate: 25.8 mEq/L — ABNORMAL HIGH (ref 20.0–24.0)
Drawn by: 308601
FIO2: 1 %
O2 Saturation: 99.8 %
O2 Saturation: 94.4 %
PEEP: 8 cmH2O
RATE: 30 resp/min
TCO2: 24.1 mmol/L (ref 0–100)
pCO2 arterial: 44.4 mmHg (ref 35.0–45.0)
pH, Arterial: 7.407 — ABNORMAL HIGH (ref 7.350–7.400)
pO2, Arterial: 183 mmHg — ABNORMAL HIGH (ref 80.0–100.0)
pO2, Arterial: 69.1 mmHg — ABNORMAL LOW (ref 80.0–100.0)

## 2011-08-08 LAB — BASIC METABOLIC PANEL
Calcium: 8.5 mg/dL (ref 8.4–10.5)
Creatinine, Ser: 0.83 mg/dL (ref 0.50–1.10)
GFR calc Af Amer: >90 mL/min (ref >90)
Sodium: 139 mEq/L (ref 135–145)

## 2011-08-08 LAB — HEPATIC FUNCTION PANEL
Bilirubin, Direct: 0.3 mg/dL (ref 0.0–0.3)
Total Bilirubin: 0.6 mg/dL (ref 0.3–1.2)

## 2011-08-08 LAB — PHOSPHORUS: Phosphorus: 1.9 mg/dL — ABNORMAL LOW (ref 2.3–4.6)

## 2011-08-08 LAB — MAGNESIUM: Magnesium: 1.8 mg/dL (ref 1.5–2.5)

## 2011-08-08 MED ORDER — ASPIRIN 81 MG PO CHEW
324.0000 mg | CHEWABLE_TABLET | ORAL | Status: AC
  Filled 2011-08-08: qty 4

## 2011-08-08 MED ORDER — INFLUENZA VIRUS VACC SPLIT PF IM SUSP
0.5000 mL | INTRAMUSCULAR | Status: AC
  Administered 2011-08-09: 0.5 mL via INTRAMUSCULAR
  Filled 2011-08-08: qty 0.5

## 2011-08-08 MED ORDER — ASPIRIN 300 MG RE SUPP
300.0000 mg | RECTAL | Status: AC
  Administered 2011-08-08: 300 mg via RECTAL
  Filled 2011-08-08: qty 1

## 2011-08-08 MED ORDER — SODIUM CHLORIDE 0.9 % IV SOLN
50.0000 ug/h | INTRAVENOUS | Status: DC
  Administered 2011-08-08: 25 ug/h via INTRAVENOUS
  Administered 2011-08-08: 100 ug/h via INTRAVENOUS
  Administered 2011-08-08: 50 ug/h via INTRAVENOUS
  Administered 2011-08-08: 100 ug/h via INTRAVENOUS
  Filled 2011-08-08 (×4): qty 50

## 2011-08-08 MED ORDER — PANTOPRAZOLE SODIUM 40 MG PO TBEC
40.0000 mg | DELAYED_RELEASE_TABLET | Freq: Every day | ORAL | Status: DC
  Filled 2011-08-08 (×2): qty 1

## 2011-08-08 MED ORDER — CHLORHEXIDINE GLUCONATE 0.12 % MT SOLN
15.0000 mL | Freq: Two times a day (BID) | OROMUCOSAL | Status: DC
  Administered 2011-08-08 – 2011-08-18 (×22): 15 mL via OROMUCOSAL
  Filled 2011-08-08 (×25): qty 15

## 2011-08-08 MED ORDER — ENOXAPARIN SODIUM 40 MG/0.4ML ~~LOC~~ SOLN
40.0000 mg | SUBCUTANEOUS | Status: DC
  Administered 2011-08-08 – 2011-08-12 (×5): 40 mg via SUBCUTANEOUS
  Filled 2011-08-08 (×5): qty 0.4

## 2011-08-08 MED ORDER — SODIUM CHLORIDE 0.9 % IV SOLN
INTRAVENOUS | Status: AC
  Administered 2011-08-08 (×3): via INTRAVENOUS

## 2011-08-08 MED ORDER — PRO-STAT SUGAR FREE PO LIQD
30.0000 mL | Freq: Three times a day (TID) | ORAL | Status: DC
  Administered 2011-08-08 – 2011-08-14 (×14): 30 mL via ORAL
  Filled 2011-08-08 (×19): qty 30

## 2011-08-08 MED ORDER — ALBUTEROL SULFATE (5 MG/ML) 0.5% IN NEBU: 5.0000 mg | INHALATION_SOLUTION | RESPIRATORY_TRACT | Status: DC | PRN

## 2011-08-08 MED ORDER — DULOXETINE HCL 30 MG PO CPEP
30.0000 mg | ORAL_CAPSULE | Freq: Every day | ORAL | Status: DC
  Administered 2011-08-08 – 2011-08-12 (×5): 30 mg via ORAL
  Filled 2011-08-08 (×6): qty 1

## 2011-08-08 MED ORDER — SODIUM CHLORIDE 0.9 % IV SOLN
3.0000 g | Freq: Four times a day (QID) | INTRAVENOUS | Status: DC
  Administered 2011-08-08 – 2011-08-10 (×10): 3 g via INTRAVENOUS
  Filled 2011-08-08 (×14): qty 3

## 2011-08-08 MED ORDER — IOHEXOL 300 MG/ML  SOLN
100.0000 mL | Freq: Once | INTRAMUSCULAR | Status: AC | PRN
  Administered 2011-08-08: 100 mL via INTRAVENOUS

## 2011-08-08 MED ORDER — PROPOFOL 10 MG/ML IV EMUL
5.0000 ug/kg/min | INTRAVENOUS | Status: DC
  Administered 2011-08-08: 25 ug/kg/min via INTRAVENOUS
  Administered 2011-08-08 – 2011-08-09 (×3): 30 ug/kg/min via INTRAVENOUS
  Administered 2011-08-09: 15 ug/kg/min via INTRAVENOUS
  Administered 2011-08-10: 20 ug/kg/min via INTRAVENOUS
  Filled 2011-08-08 (×8): qty 100

## 2011-08-08 MED ORDER — FENTANYL BOLUS VIA INFUSION
50.0000 ug | Freq: Four times a day (QID) | INTRAVENOUS | Status: DC | PRN
  Filled 2011-08-08: qty 100

## 2011-08-08 MED ORDER — OXEPA PO LIQD
1000.0000 mL | ORAL | Status: DC
  Administered 2011-08-08 – 2011-08-11 (×3): 1000 mL
  Filled 2011-08-08 (×4): qty 1000

## 2011-08-08 MED ORDER — SODIUM CHLORIDE 0.9 % IV SOLN
250.0000 mL | INTRAVENOUS | Status: DC | PRN
  Administered 2011-08-09: 10 mL via INTRAVENOUS

## 2011-08-08 NOTE — Procedures (Signed)
Central Venous Catheter Insertion Procedure Note [name] [id number] Jul 23, 1956  Procedure: Insertion of Central Venous Catheter Indications: Drug and/or fluid administration  Procedure Details Consent: Risks of procedure as well as the alternatives and risks of each were explained to the (patient/caregiver).  Consent for procedure obtained. Time Out: Verified patient identification, verified procedure, site/side was marked, verified correct patient position, special equipment/implants available, medications/allergies/relevent history reviewed, required imaging and test results available.  Performed  Maximum sterile technique was used including antiseptics, cap, gloves, gown, hand hygiene, mask and sheet. Skin prep: Chlorhexidine; local anesthetic administered A antimicrobial bonded/coated triple lumen catheter was placed in the right internal jugular vein using the Seldinger technique.  Evaluation Blood flow good Complications: No apparent complications Patient did tolerate procedure well. Chest X-ray ordered to verify placement.  CXR: normal.  Shraga Custard 08/08/2011, 4:14 AM

## 2011-08-08 NOTE — ED Notes (Signed)
CCMD prepping to do CVC. proprofol increased to for sedation.

## 2011-08-08 NOTE — Progress Notes (Signed)
Ur review completed  

## 2011-08-08 NOTE — Progress Notes (Signed)
0200 Pt arrived from ED intubated with 7.5 ET tube secured at 20 at the teeth. Current Vent settings are PRVC 420, RR 22, Peep 10 and 100%.  Pt tolerating settings well at this time, RT to monitor and assess as needed

## 2011-08-08 NOTE — Progress Notes (Signed)
Notified of patient's status by family member  Pt's chart reviewed  Pt with aspiration pneumonia/ards per notes  With regard to extremity management while in the ICU: Keep hand elevated Keep splint on elbow at all times NWB left elbow  She has left ankle distal fibula fracture and is WBAT  I will continue to check on patient and contact me at pager [phone] should any questions arise

## 2011-08-08 NOTE — ED Notes (Signed)
CVC placement completed by CCMD, called for [facility] for placement confirmation. Pt tolerated well

## 2011-08-08 NOTE — H&P (Signed)
Admission Note - Critical Care Medicine Note  Patient Details:    [name] is an 55 y.o. female with who had recent left elbow surgery. She was discharged home with pain medications post operatively. On 11/25 Her fiance witnessed her take a pain pill and a sleeping pill.  Several hours later he was alerted to her moaning and gurgling.  He found her minimally responsive on her back in bed and appeared to have vomited.  He turned her to her side and cleared vomitus from her mouth.  He called for EMS and she received narcan with improvement in her mental status and was transported to the ED.  Due to persistent hypoxia despite nonrebreather and increased work of breathing she was electively intubated in the ED.  Lines/tubes :11/25 OETT>>>   Microbiology/Sepsis markers: 11/26: MRSA : neg 11/26: BCX2>>> 11/26 sputum>>>  Anti-infectives:  unasyn 11/26 (Asp)>>> clinda 11/26 (asp)>>>   Subjective:    Overnight Issues:   Objective:  Vital signs for last 24 hours: Temp:  [100 F (37.8 C)-103.5 F (39.7 C)] 101.7 F (38.7 C) (11/26 0600) Pulse Rate:  [117-144] 125  (11/26 0600) Resp:  [16-32] 30  (11/26 0600) BP: (92-171)/(50-95) 117/59 mmHg (11/26 0330) SpO2:  [72 %-100 %] 95 % (11/26 0600) Arterial Line BP: (98-119)/(49-67) 102/51 mmHg (11/26 0600) FiO2 (%):  [40 %-100 %] 40 % (11/26 1300) Weight:  [93.8 kg (206 lb 12.7 oz)] 206 lb 12.7 oz (93.8 kg) (11/26 0300)  Hemodynamic parameters for last 24 hours:    Intake/Output from previous day: 11/25 0701 - 11/26 0700 In: 657.5 [I.V.:527.5; NG/GT:30; IV Piggyback:100] Out: 500 [Urine:500]  Intake/Output this shift: Total I/O In: -  Out: 125 [Urine:125]  Vent settings for last 24 hours: Vent Mode:  [-] PRVC FiO2 (%):  [40 %-100 %] 40 % Set Rate:  [16 bmp-30 bmp] 30 bmp Vt Set:  [280 mL-420 mL] 280 mL PEEP:  [5 cmH20-10 cmH20] 5 cmH20 Plateau Pressure:  [16 cmH20-25 cmH20] 18 cmH20  Physical Exam:  General:  alert, no respiratory distress and sedated and intubated Neuro: nonfocal exam and sedated.  Moves all 4 extremities spontaneously. HEENT/Neck: no JVD, ETT WNL , PERRL and ETT Resp: clear to auscultation bilaterally, decreased in bases, CVS: Tachy, regular, no m/r/g GI: soft, nontender, BS WNL, no r/g Skin: no rash Extremities: Wrapped left arm, L>R LE edema.  PCXR: persistent bilateral airspace disease.   Labs  Lab 08/08/11 0500 08/07/11 2042 08/05/11 1344  NA 139 136 142  K 3.9 4.5 4.4  CL 103 100 105  CO2 29 25 30   BUN 11 11 13   CREATININE 0.83 0.87 0.77  GLUCOSE 139* 138* 178*  ]  Lab 08/08/11 0500 08/07/11 2042 08/05/11 1344  HGB 8.5* 9.9* 10.3*  HCT 28.7* 32.9* 33.7*  WBC 7.1 5.9 4.2  PLT 204 222 234  }    ABG    Component Value Date/Time   PHART 7.407* 08/08/2011 0412   PCO2ART 44.4 08/08/2011 0412   PO2ART 69.1* 08/08/2011 0412   HCO3 27.4* 08/08/2011 0412   TCO2 25.8 08/08/2011 0412   ACIDBASEDEF 0.6 08/07/2011 2229   O2SAT 94.4 08/08/2011 0412    Assessment/plan: Acute Respiratory failure. Now with progressive ALI and ARDS. Most c/w aspiration event Plan: -follow up culture data -ards protocol -f/u cxr in am -f/u abg in am  AMS/encephalopathy Plan:  -supportive care  Anemia  Lab 08/08/11 0500 08/07/11 2042 08/05/11 1344  HGB 8.5* 9.9* 10.3*  ]  LOS: 1 day   Additional comments:I personally reviewed lab results and chest x-rays.  Critical Care Total Time*: 1 Hour    Seen with Anders Simmonds,  NP  Sandrea Hughs, MD Pulmonary and Critical Care Medicine [facility] Cell [phone]

## 2011-08-08 NOTE — Procedures (Signed)
Arterial Catheter Insertion Procedure Note Sylwia Cuervo [id number] [birth year]  Procedure: Insertion of Arterial Catheter  Indications: Blood pressure monitoring  Procedure Details Consent: Unable to obtain consent because of altered level of consciousness. Time Out: Verified patient identification, verified procedure, site/side was marked, verified correct patient position, special equipment/implants available, medications/allergies/relevent history reviewed, required imaging and test results available.  Performed  Maximum sterile technique was used including cap, gloves, gown, hand hygiene, mask and sheet. Skin prep: Chlorhexidine; local anesthetic administered 20 gauge catheter was inserted into right radial artery using the Seldinger technique.  Evaluation Blood flow good; BP tracing good. Complications: No apparent complications.   Berton Bon 08/08/2011

## 2011-08-08 NOTE — ED Notes (Signed)
CTA completed. Pt tolerated well. Diprivan increased to 

## 2011-08-08 NOTE — Progress Notes (Signed)
Bilateral lower extremity venous duplex completed at 09:30.  Preliminary report is negative for DVT, SVT, or a Baker's cyst. Smiley Houseman 08/08/2011, 10:37 AM

## 2011-08-08 NOTE — Progress Notes (Signed)
INITIAL ADULT NUTRITION ASSESSMENT Date: 08/08/2011   Time: 2:18 PM Reason for Assessment: consult  ASSESSMENT: Female 55 y.o.  Dx: aspiration Hx:  Past Medical History  Diagnosis Date  . Ovarian cyst   . Pneumonia     hx x4  . Cancer     precancerous cervix  . GERD (gastroesophageal reflux disease)   . Headache   . Hiatal hernia   . PONV (postoperative nausea and vomiting) 08/06/11  . Difficult intubation    Past Surgical History  Procedure Date  . Cesarean section 85  . Knee surgery 14    rt arthroscopy  . Ovarian cyst removal 80  . Abdominal hysterectomy 86  ORIF to L elbow  Related Meds:  Scheduled Meds:   . acetaminophen      . albuterol  5 mg Nebulization Once  . ampicillin-sulbactam (UNASYN) IV  3 g Intravenous Q6H  . aspirin  324 mg Oral NOW   Or  . aspirin  300 mg Rectal NOW  . chlorhexidine  15 mL Mouth/Throat BID  . clindamycin (CLEOCIN) IV  900 mg Intravenous Once  . DULoxetine  30 mg Oral Daily  . enoxaparin  40 mg Subcutaneous Q24H  . etomidate      . influenza  inactive virus vaccine  0.5 mL Intramuscular Tomorrow-1000  . ipratropium  0.5 mg Nebulization Once  . lidocaine (cardiac) 100 mg/17ml      . naloxone      . naloxone      . ondansetron      . pantoprazole  40 mg Oral Q1200  . propofol      . rocuronium      . succinylcholine       Continuous Infusions:   . sodium chloride 100 mL/hr at 08/08/11 0401  . fentaNYL infusion INTRAVENOUS 25 mcg/hr (08/08/11 0747)  . propofol 25 mcg/kg/min (08/08/11 0413)   PRN Meds:.sodium chloride, albuterol, fentaNYL, iohexol   Ht: 5\' 1"  (154.9 cm)  Wt: 206 lb 12.7 oz (93.8 kg)  Ideal Wt: 62.3 kg % Ideal Wt: 150%  Usual Wt: same % Usual Wt:  Body mass index is 39.07 kg/(m^2).  Food/Nutrition Related Hx: Pt sustained fx to elbow after fall this past Saturday (11/24).  Pt was discharged home where she took pain medication in addition to a sleeping aide.  Fiance found pt with vomit still in  her mouth, unresponsive.  EMS brought pt to hospital where she was intubate.  Currently with OGT. Per RN, pt on ARDS protocol. Diprivan at 14 mL/hr providing 369 kcal/day.  RN does not anticipate increased rate, hopeful for wean from diprivan  Labs:  CMP     Component Value Date/Time   NA 139 08/08/2011 0500   K 3.9 08/08/2011 0500   CL 103 08/08/2011 0500   CO2 29 08/08/2011 0500   GLUCOSE 139* 08/08/2011 0500   BUN 11 08/08/2011 0500   CREATININE 0.83 08/08/2011 0500   CALCIUM 8.5 08/08/2011 0500   PROT 5.4* 08/08/2011 0500   ALBUMIN 2.6* 08/08/2011 0500   AST 48* 08/08/2011 0500   ALT 22 08/08/2011 0500   ALKPHOS 69 08/08/2011 0500   BILITOT 0.6 08/08/2011 0500   GFRNONAA 79* 08/08/2011 0500   GFRAA >90 08/08/2011 0500    Intake: NPO  Output:  Intake/Output Summary (Last 24 hours) at 08/08/11 1424 Last data filed at 08/08/11 0747  Gross per 24 hour  Intake  657.5 ml  Output    625 ml  Net  32.5 ml  Fiance reports constipation PTA.  Last BM Saturday    Diet Order:  NPO  Supplements/Tube Feeding: none at this time  IVF:    sodium chloride Last Rate: 100 mL/hr at 08/08/11 0401  fentaNYL infusion INTRAVENOUS Last Rate: 25 mcg/hr (08/08/11 0747)  propofol Last Rate: 25 mcg/kg/min (08/08/11 0413)    Estimated Nutritional Needs:   Kcal:1420-1570 kcal Protein:82-95 g Fluid:>2.0 L/day  NUTRITION DIAGNOSIS: -Inadequate oral intake (NI-2.1).  Status: Ongoing  RELATED TO: mechanical ventilation  AS EVIDENCE BY: pt intubated, NPO  MONITORING/EVALUATION(Goals): 1.  Enteral nutrition; initiation with tolerance  EDUCATION NEEDS: -Education needs addressed with family  INTERVENTION: 1.  Enteral nutrition, initiate Oxepa at 25 mL/hr continuous via OGT.  Do not advance. 2. Supplement; Prostat TID.  Nutrition support + diprivan:  1485 kcal, 82 g protein, 471 mL free water.     Dietitian [phone]  DOCUMENTATION CODES Per approved criteria  -Morbid  Obesity    Loyce Dys [facility] 08/08/2011, 2:18 PM

## 2011-08-08 NOTE — ED Notes (Signed)
CVC placement confirmed by Dr. Patria Mane. Proprofol now infusing through proximal port.

## 2011-08-09 DIAGNOSIS — J9600 Acute respiratory failure, unspecified whether with hypoxia or hypercapnia: Secondary | ICD-10-CM

## 2011-08-09 DIAGNOSIS — I509 Heart failure, unspecified: Secondary | ICD-10-CM

## 2011-08-09 DIAGNOSIS — M7989 Other specified soft tissue disorders: Secondary | ICD-10-CM

## 2011-08-09 MED ORDER — PANTOPRAZOLE SODIUM 40 MG PO PACK
40.0000 mg | PACK | Freq: Every day | ORAL | Status: DC
  Administered 2011-08-09 – 2011-08-24 (×14): 40 mg
  Filled 2011-08-09 (×31): qty 20

## 2011-08-09 NOTE — Progress Notes (Signed)
Nutrition Brief Note:  Pt remains intubated, continues diprivan at 8.4 mL/hr providing 221 kcal/day.  Nutrition support + diprivan providing 1337 kcal, 82 g protein meeting 94% estimated kcal needs, 100% protein needs. Pt tolerating with 0 mL residuals q 4 hrs. Labs and meds reviewed.  No changes to TF today.  RN hoping continued wean from diprivan.  RD to reassess as changes made.  Pager:  [phone].

## 2011-08-09 NOTE — H&P (Signed)
Admission Note - Critical Care Medicine Note  Patient Details:    [name] is an 55 y.o. female with who had   left elbow surgery 11/24> discharged home with pain medications post operatively. On 11/25 pm Her fiance witnessed her take a pain pill and a sleeping pill.  Several hours later he was alerted to her moaning and gurgling.  He found her minimally responsive on her back in bed and appeared to have vomited.  He turned her to her side and cleared vomitus from her mouth.  He called for EMS and she received narcan with improvement in her mental status and was transported to the ED.  Due to persistent hypoxia despite nonrebreather and increased work of breathing she was electively intubated in the ED.  Lines/tubes 11/25 OETT>>>   Microbiology/Sepsis markers: 11/26: MRSA : neg 11/26: BCX2>>> 11/26 sputum>>>gpr>>  Anti-infectives:  unasyn 11/26 (Asp)>>> clinda 11/26 (asp)>>> 11/27  Studies/Events:  11/27  bilat ue doppler neg    Subjective:    Overnight Issues:  Anxiety barrier to extubation  Objective:  Vital signs for last 24 hours: Temp:  [99.5 F (37.5 C)-101.3 F (38.5 C)] 100.8 F (38.2 C) (11/27 1000) Pulse Rate:  [112-126] 117  (11/27 1000) Resp:  [11-33] 11  (11/27 1000) BP: (102-116)/(53-74) 116/74 mmHg (11/27 0900) SpO2:  [92 %-97 %] 95 % (11/27 0720) FiO2 (%):  [40 %] 40 % (11/27 1122)  Hemodynamic parameters for last 24 hours: CVP:  [12 mmHg] 12 mmHg  Intake/Output from previous day: 11/26 0701 - 11/27 0700 In: 3689.3 [I.V.:3024.3; NG/GT:365; IV Piggyback:300] Out: 1145 [Urine:1095; Emesis/NG output:50]    Intake/Output this shift: Total I/O In: 200 [NG/GT:100; IV Piggyback:100] Out: 150 [Urine:150]  Vent settings for last 24 hours: Vent Mode:  [-] PRVC FiO2 (%):  [40 %] 40 % Set Rate:  [30 bmp] 30 bmp Vt Set:  [280 mL] 280 mL PEEP:  [5 cmH20] 5 cmH20 Pressure Support:  [5 cmH20] 5 cmH20 Plateau Pressure:  [16 cmH20-23 cmH20] 16  cmH20  Physical Exam:  General: sedated on vent, no respiratory distress   Neuro: nonfocal exam and sedated.  Moves all 4 extremities spontaneously. HEENT/Neck: no JVD, ETT WNL , PERRL and ETT Resp: clear to auscultation bilaterally, decreased in bases, CVS: Tachy, regular, no m/r/g GI: soft, nontender, BS WNL, no r/g Skin: no rash Extremities: Wrapped left arm, L>R LE edema.  PCXR: none 11/27  Labs  Lab 08/08/11 0500 08/07/11 2042 08/05/11 1344  NA 139 136 142  K 3.9 4.5 4.4  CL 103 100 105  CO2 29 25 30   BUN 11 11 13   CREATININE 0.83 0.87 0.77  GLUCOSE 139* 138* 178*  ]  Lab 08/08/11 0500 08/07/11 2042 08/05/11 1344  HGB 8.5* 9.9* 10.3*  HCT 28.7* 32.9* 33.7*  WBC 7.1 5.9 4.2  PLT 204 222 234      ABG    Component Value Date/Time   PHART 7.407* 08/08/2011 0412   PCO2ART 44.4 08/08/2011 0412   PO2ART 69.1* 08/08/2011 0412   HCO3 27.4* 08/08/2011 0412   TCO2 25.8 08/08/2011 0412   ACIDBASEDEF 0.6 08/07/2011 2229   O2SAT 94.4 08/08/2011 0412    Assessment/plan: Acute Respiratory failure. Now with progressive ALI and ARDS. Most c/w aspiration event Plan: -follow up culture data see dashboard -ards protocol -f/u cxr  -f/u abg   AMS/encephalopathy Plan:  -supportive care  Anemia  Lab 08/08/11 0500 08/07/11 2042 08/05/11 1344  HGB 8.5* 9.9* 10.3*  Continue ppi, no active bleed,  ? Post op re-equilibration    LOS: 2 days       Sandrea Hughs, MD Pulmonary and Critical Care Medicine [facility] Cell [phone]

## 2011-08-09 NOTE — Progress Notes (Signed)
Bilateral upper extremity venous duplex completed at 09:35.  Preliminary report is negative for DVT or SVT in the veins visualized bilaterally. Smiley Houseman 08/09/2011, 11:03 AM

## 2011-08-09 NOTE — Progress Notes (Signed)
Pt seen/examined Chart reviewed Will continue to follow as inpt Have missed fiance last two visits to talk with will try to call

## 2011-08-10 ENCOUNTER — Inpatient Hospital Stay (HOSPITAL_COMMUNITY): Payer: No Typology Code available for payment source

## 2011-08-10 ENCOUNTER — Other Ambulatory Visit (HOSPITAL_COMMUNITY): Payer: No Typology Code available for payment source

## 2011-08-10 LAB — BASIC METABOLIC PANEL
BUN: 17 mg/dL (ref 6–23)
CO2: 31 mEq/L (ref 19–32)
Calcium: 8.5 mg/dL (ref 8.4–10.5)
Chloride: 107 mEq/L (ref 96–112)
Creatinine, Ser: 0.61 mg/dL (ref 0.50–1.10)
Glucose, Bld: 125 mg/dL — ABNORMAL HIGH (ref 70–99)

## 2011-08-10 LAB — CBC
HCT: 25.9 % — ABNORMAL LOW (ref 36.0–46.0)
Hemoglobin: 7.9 g/dL — ABNORMAL LOW (ref 12.0–15.0)
MCH: 24.8 pg — ABNORMAL LOW (ref 26.0–34.0)
MCV: 81.2 fL (ref 78.0–100.0)
Platelets: 189 10*3/uL (ref 150–400)
RBC: 3.19 MIL/uL — ABNORMAL LOW (ref 3.87–5.11)
WBC: 8.4 10*3/uL (ref 4.0–10.5)

## 2011-08-10 LAB — CULTURE, RESPIRATORY W GRAM STAIN

## 2011-08-10 MED ORDER — SODIUM CHLORIDE 0.9 % IV SOLN
25.0000 ug/h | INTRAVENOUS | Status: DC
  Administered 2011-08-10 – 2011-08-11 (×2): 200 ug/h via INTRAVENOUS
  Administered 2011-08-12: 100 ug/h via INTRAVENOUS
  Administered 2011-08-13: 110 ug/h via INTRAVENOUS
  Administered 2011-08-13: 100 ug/h via INTRAVENOUS
  Administered 2011-08-14: 125 ug/h via INTRAVENOUS
  Administered 2011-08-14 (×2): 175 ug/h via INTRAVENOUS
  Administered 2011-08-14: 125 ug/h via INTRAVENOUS
  Administered 2011-08-16: 100 ug/h via INTRAVENOUS
  Filled 2011-08-10 (×8): qty 50

## 2011-08-10 MED ORDER — SODIUM CHLORIDE 0.9 % IV SOLN
0.4000 ug/kg/h | INTRAVENOUS | Status: DC
  Filled 2011-08-10: qty 2

## 2011-08-10 MED ORDER — FUROSEMIDE 10 MG/ML IJ SOLN
40.0000 mg | Freq: Two times a day (BID) | INTRAMUSCULAR | Status: DC
  Administered 2011-08-10 – 2011-08-11 (×3): 40 mg via INTRAVENOUS
  Filled 2011-08-10 (×4): qty 4

## 2011-08-10 MED ORDER — POTASSIUM CHLORIDE 10 MEQ/50ML IV SOLN
10.0000 meq | INTRAVENOUS | Status: AC
  Administered 2011-08-10 (×4): 10 meq via INTRAVENOUS
  Filled 2011-08-10 (×5): qty 50

## 2011-08-10 MED ORDER — RACEPINEPHRINE HCL 2.25 % IN NEBU
INHALATION_SOLUTION | RESPIRATORY_TRACT | Status: AC
  Filled 2011-08-10: qty 1

## 2011-08-10 MED ORDER — DEXTROSE 5 % IV SOLN
1.0000 g | INTRAVENOUS | Status: DC
  Administered 2011-08-10 – 2011-08-17 (×8): 1 g via INTRAVENOUS
  Filled 2011-08-10 (×10): qty 10

## 2011-08-10 MED ORDER — HALOPERIDOL LACTATE 5 MG/ML IJ SOLN
INTRAMUSCULAR | Status: AC
  Administered 2011-08-10: 12:00:00
  Filled 2011-08-10: qty 1

## 2011-08-10 MED ORDER — PROPOFOL 10 MG/ML IV EMUL
5.0000 ug/kg/min | INTRAVENOUS | Status: DC
  Administered 2011-08-10: 30 ug/kg/min via INTRAVENOUS
  Administered 2011-08-10: 25 ug/kg/min via INTRAVENOUS
  Administered 2011-08-11: 20 ug/kg/min via INTRAVENOUS
  Administered 2011-08-11: 25 ug/kg/min via INTRAVENOUS
  Administered 2011-08-12 – 2011-08-13 (×6): 20 ug/kg/min via INTRAVENOUS
  Administered 2011-08-13: 18 ug/kg/min via INTRAVENOUS
  Administered 2011-08-14: 25 ug/kg/min via INTRAVENOUS
  Administered 2011-08-14: 40 ug/kg/min via INTRAVENOUS
  Administered 2011-08-14 (×2): 38 ug/kg/min via INTRAVENOUS
  Administered 2011-08-14 – 2011-08-15 (×5): 34 ug/kg/min via INTRAVENOUS
  Administered 2011-08-15: 31.73 ug/kg/min via INTRAVENOUS
  Administered 2011-08-16: 15 ug/kg/min via INTRAVENOUS
  Administered 2011-08-16: 27.889 ug/kg/min via INTRAVENOUS
  Administered 2011-08-17: 30 ug/kg/min via INTRAVENOUS
  Administered 2011-08-17: 35 ug/kg/min via INTRAVENOUS
  Administered 2011-08-18 – 2011-08-19 (×2): 15 ug/kg/min via INTRAVENOUS
  Administered 2011-08-19: 10 ug/kg/min via INTRAVENOUS
  Administered 2011-08-20: 18 ug/kg/min via INTRAVENOUS
  Filled 2011-08-10 (×32): qty 100

## 2011-08-10 NOTE — Plan of Care (Signed)
Problem: Problem: Respiratory Progression Goal: ABLE TO EXTUBATE Outcome: Progressing Daily weaning trials and daily wake up assessments, pt unable to tolerate weaning of sedation at this time

## 2011-08-10 NOTE — Progress Notes (Addendum)
Critical Care Medicine Note  Patient Details:    [name] is an 55 y.o. female with who had   left elbow surgery 11/24> discharged home with pain medications post operatively. On 11/25 pm Her fiance witnessed her take a pain pill and a sleeping pill.  Several hours later he was alerted to her moaning and gurgling.  He found her minimally responsive on her back in bed and appeared to have vomited.  He turned her to her side and cleared vomitus from her mouth.  He called for EMS and she received narcan with improvement in her mental status and was transported to the ED.  Due to persistent hypoxia despite nonrebreather and increased work of breathing she was electively intubated in the ED.  Lines/tubes 11/25 OETT>>>11/18 11/26: right IJ CVL>>>  Microbiology/Sepsis markers: 11/26: MRSA : neg 11/26: BCX2>>> 11/26 sputum: moderate Klebsiella oxytoca (pan sens, but resistance to Unasyn)  Anti-infectives:  unasyn 11/26 (Asp)>>>11/28 clinda 11/26 (asp)>>> 11/27 11/28 rocephin (K oxytosa)>>>  Studies/Events:  11/27  bilat ue doppler neg    Subjective:    Overnight Issues:  Self extubated this morning, very restless and agitated.  Objective:  Vital signs for last 24 hours: Temp:  [97.5 F (36.4 C)-100.9 F (38.3 C)] 98.4 F (36.9 C) (11/28 1000) Pulse Rate:  [90-111] 109  (11/28 1000) Resp:  [9-30] 20  (11/28 1000) BP: (104-113)/(50-56) 107/52 mmHg (11/28 0402) SpO2:  [93 %-98 %] 95 % (11/28 1000) FiO2 (%):  [30 %-40 %] 40 % (11/28 0818) Weight:  [99.8 kg (220 lb 0.3 oz)] 220 lb 0.3 oz (99.8 kg) (11/28 0600)  Hemodynamic parameters for last 24 hours:     Intake/Output this shift: Total I/O In: 125 [NG/GT:25; IV Piggyback:100] Out: 135 [Urine:135]   Physical Exam:  General: agitated, combative, writhing in the bed.  Neuro:confused.  Moves all 4 extremities spontaneously. HEENT/Neck: no JVD, mucous membranes dry, upper and expiratory wheeze noted. Resp:  Diffuse rhonchi CVS: Tachy, regular, no m/r/g GI: soft, nontender, BS WNL, no r/g Skin: no rash Extremities: Wrapped left arm, L>R LE edema.  PCXR:  No significant improvement, and kidneys to have diffuse bilateral airspace disease  Labs  Lab 08/10/11 0409 08/08/11 0500 08/07/11 2042  NA 145 139 136  K 3.5 3.9 4.5  CL 107 103 100  CO2 31 29 25   BUN 17 11 11   CREATININE 0.61 0.83 0.87  GLUCOSE 125* 139* 138*    Lab 08/10/11 0409 08/08/11 0500 08/07/11 2042  HGB 7.9* 8.5* 9.9*  HCT 25.9* 28.7* 32.9*  WBC 8.4 7.1 5.9  PLT 189 204 222       ABG   Assessment/plan: Acute Respiratory failure with ALI and ARDS. S/P c/w aspiration event. No significant change or improvement. First sputum culture grew out Klebsiella, interestingly this was only intermittently sensitive to Unasyn. She has self extubated this a.m.( 11/28). Her work or breathing is high. Plan: -change antibiotics to Rocephin  -keep n.p.o. -Continue bronchodilators -Diuresis -Will treat agitation with Precedex infusion.  AMS/encephalopathy, this is persistent. Plan:  -one time Haldol dose, followed by Precedex infusion.  Anemia  Lab 08/10/11 0409 08/08/11 0500 08/07/11 2042  HGB 7.9* 8.5* 9.9*    Continue ppi, no active bleed,  ? Post op re-equilibration, maybe some degree of hemodilution. Plan: -transfuse for hemoglobin less than 7 -Continue proton pump inhibitor  LOS: 3 days     The patient is critically ill with multiple organ systems failure and requires high complexity  decision making for assessment and support, frequent evaluation and titration of therapies, application of advanced monitoring technologies and extensive interpretation of multiple databases. Critical Care Time devoted to patient care services described in this note is 45 minutes.  Sandrea Hughs, MD Pulmonary and Critical Care Medicine [facility] Cell [phone]

## 2011-08-10 NOTE — Consults (Signed)
Anesthesia called to see patient. Patient self extubated 1 hour ago patient thrashing in bed on nonrebreather. Being treated for aspiration pneumonia. Dr. Shireen Quan here . K 3.5. Rapid Sequence intubation with cricoid pressure. Propofol 100 mg IV and Anectine 100 mg IV given. Cords visualized with Mac 4 Blade grade 1 view. +BBS + ETCO2, Dental intact. Ett taped by respiratory at 22 cm At lip.

## 2011-08-10 NOTE — Progress Notes (Signed)
Pt self extubated and was placed on a NRB mask at 15 lpm. Pt is very combative and will not follow commands. Pt does not appear to have any adverse reactions caused by self extubating at this time. Family is at bedside trying to calm pt down.

## 2011-08-10 NOTE — Progress Notes (Addendum)
Failed extubation. Increased work of breathing.  Plan: -have asked anesthesia to re-intubate -sedation gtt -abx change as mentioned before -diurese.

## 2011-08-11 ENCOUNTER — Encounter (HOSPITAL_COMMUNITY): Payer: Self-pay | Admitting: Orthopedic Surgery

## 2011-08-11 ENCOUNTER — Inpatient Hospital Stay (HOSPITAL_COMMUNITY): Payer: No Typology Code available for payment source

## 2011-08-11 DIAGNOSIS — J984 Other disorders of lung: Secondary | ICD-10-CM

## 2011-08-11 DIAGNOSIS — J9600 Acute respiratory failure, unspecified whether with hypoxia or hypercapnia: Secondary | ICD-10-CM

## 2011-08-11 DIAGNOSIS — Z9911 Dependence on respirator [ventilator] status: Secondary | ICD-10-CM

## 2011-08-11 DIAGNOSIS — I509 Heart failure, unspecified: Secondary | ICD-10-CM

## 2011-08-11 LAB — CBC
HCT: 27 % — ABNORMAL LOW (ref 36.0–46.0)
MCH: 23.8 pg — ABNORMAL LOW (ref 26.0–34.0)
MCV: 79.2 fL (ref 78.0–100.0)
RDW: 19.2 % — ABNORMAL HIGH (ref 11.5–15.5)
WBC: 9.8 10*3/uL (ref 4.0–10.5)

## 2011-08-11 LAB — COMPREHENSIVE METABOLIC PANEL
Albumin: 2.4 g/dL — ABNORMAL LOW (ref 3.5–5.2)
BUN: 19 mg/dL (ref 6–23)
Calcium: 8.7 mg/dL (ref 8.4–10.5)
Chloride: 106 mEq/L (ref 96–112)
Creatinine, Ser: 0.74 mg/dL (ref 0.50–1.10)
GFR calc non Af Amer: >90 mL/min (ref >90)
Total Bilirubin: 0.5 mg/dL (ref 0.3–1.2)

## 2011-08-11 MED ORDER — CLONAZEPAM 0.1 MG/ML ORAL SUSPENSION: 0.5000 mg | Freq: Two times a day (BID) | ORAL | Status: DC

## 2011-08-11 MED ORDER — QUETIAPINE FUMARATE 50 MG PO TABS
50.0000 mg | ORAL_TABLET | Freq: Two times a day (BID) | ORAL | Status: DC
  Administered 2011-08-11 – 2011-08-14 (×7): 50 mg via ORAL
  Filled 2011-08-11 (×9): qty 1

## 2011-08-11 MED ORDER — ALBUTEROL SULFATE (5 MG/ML) 0.5% IN NEBU
2.5000 mg | INHALATION_SOLUTION | RESPIRATORY_TRACT | Status: DC | PRN
  Administered 2011-08-17 – 2011-08-18 (×3): 2.5 mg via RESPIRATORY_TRACT
  Filled 2011-08-11 (×3): qty 0.5

## 2011-08-11 MED ORDER — FREE WATER
200.0000 mL | Freq: Three times a day (TID) | Status: DC
  Administered 2011-08-11 – 2011-08-12 (×4): 200 mL
  Filled 2011-08-11 (×6): qty 200

## 2011-08-11 MED ORDER — CLONAZEPAM 0.5 MG PO TABS
0.5000 mg | ORAL_TABLET | Freq: Two times a day (BID) | ORAL | Status: DC
  Administered 2011-08-11 – 2011-08-15 (×9): 0.5 mg via ORAL
  Filled 2011-08-11 (×9): qty 1

## 2011-08-11 MED ORDER — DEXTROSE-NACL 5-0.45 % IV SOLN: INTRAVENOUS | Status: DC

## 2011-08-11 MED ORDER — POTASSIUM CHLORIDE 10 MEQ/50ML IV SOLN
10.0000 meq | INTRAVENOUS | Status: AC
  Administered 2011-08-11 (×4): 10 meq via INTRAVENOUS
  Filled 2011-08-11 (×4): qty 50

## 2011-08-11 MED ORDER — DEXTROSE 5 % IV SOLN
INTRAVENOUS | Status: DC
Start: 2011-08-11 — End: 2011-08-21
  Administered 2011-08-11 – 2011-08-17 (×5): via INTRAVENOUS
  Administered 2011-08-20: 20 mL via INTRAVENOUS

## 2011-08-11 NOTE — Progress Notes (Signed)
   Critical Care Medicine Note  Patient Details:    [name] is an 55 y.o. female with who had   left elbow surgery 11/24> discharged home with pain medications post operatively. On 11/25 pm Her fiance witnessed her take a pain pill and a sleeping pill.  Several hours later he was alerted to her moaning and gurgling.  He found her minimally responsive on her back in bed and appeared to have vomited.  He turned her to her side and cleared vomitus from her mouth.  He called for EMS and she received narcan with improvement in her mental status and was transported to the ED.  Due to persistent hypoxia despite nonrebreather and increased work of breathing she was electively intubated in the ED.  Lines/tubes 11/25 OETT>>>11/28 11/28 (reintubation)>>> 11/26: right IJ CVL>>>  Microbiology/Sepsis markers: 11/26: MRSA : neg 11/26: BCX2>>> 11/26 sputum: moderate Klebsiella oxytoca (pan sens, but resistance to Unasyn)  Anti-infectives:  unasyn 11/26 (Asp)>>>11/28 clinda 11/26 (asp)>>> 11/27 11/28 rocephin (K oxytoca)>>>  Studies/Events:  11/27  bilat ue doppler neg   Subjective:    Overnight Issues:  Sedated on vent  Objective:  Vital signs for last 24 hours: Temp:  [99.7 F (37.6 C)-100.8 F (38.2 C)] 100.4 F (38 C) (11/29 1100) Pulse Rate:  [82-110] 106  (11/29 1218) Resp:  [23-30] 30  (11/29 1218) BP: (108-121)/(50-54) 121/54 mmHg (11/29 1218) SpO2:  [92 %-98 %] 94 % (11/29 1218) FiO2 (%):  [40 %-50 %] 40 % (11/29 1218) Weight:  [97.9 kg (215 lb 13.3 oz)] 215 lb 13.3 oz (97.9 kg) (11/29 0500)  Hemodynamic parameters for last 24 hours: CVP:  [12 mmHg-13 mmHg] 13 mmHg   Intake/Output this shift: Total I/O In: 224.4 [I.V.:124.4; IV Piggyback:100] Out: 185 [Urine:185]   Physical Exam:  General: sedated on vent Neuro: Moves all 4 extremities spontaneously. HEENT/Neck: no JVD, orally intubated Resp: decreased in bases. Rhonchi at times.  CVS: Tachy, regular, no  m/r/g GI: soft, nontender, BS WNL, no r/g Skin: no rash Extremities: Wrapped left arm, L>R LE edema.  PCXR:  Slight improvement in diffuse airspace disease.   Labs Significant labs reviewed in A&P  Assessment/plan: Acute Respiratory failure with ALI and ARDS. S/P c/w aspiration event.+ Klebsiella oxitoca, ( intermittently sensitive to Unasyn). Rocephin started on 11/28. She has self extubated 11/28, and re-intubated with-in an hour. Her CXR has improved a little today. Plan: -cont Rocephin  -Continue bronchodilators -diprivan gtt.  -hope to get agitation better controlled then start weaning.   AMS/encephalopathy, this is persistent. Plan:  -cont diprivan -will add low dose Seroquel/clonazepam (concern for w/d)  Hypernatremia  Lab 08/11/11 0349 08/10/11 0409 08/08/11 0500  NA 149* 145 139  Plan: -free water and recheck Na in am -hold diuretics today  Anemia: no evidence of bleeding  Lab 08/11/11 0349 08/10/11 0409 08/08/11 0500  HGB 8.1* 7.9* 8.5*    Plan: -transfuse for hemoglobin less than 7 -Continue proton pump inhibitor    LOS: 4 days   Anders Simmonds ACNP-BC [facility] Pulmonary/Critical Care Pager # [phone] OR # [phone] if no answer

## 2011-08-11 NOTE — Progress Notes (Signed)
Ur review completed  

## 2011-08-11 NOTE — Progress Notes (Signed)
Pt seen and examined and database reviewed. I agree with above findings, assessment and plan  Deshae Dickison, MD;  PCCM service; Mobile [phone]  

## 2011-08-11 NOTE — Progress Notes (Signed)
Nutrition Brief Note:  Pt remains intubated, continues diprivan at 11.1 mL/hr providing 221 kcal/day.   Nutrition support + diprivan providing 1411 kcal, 82 g protein meeting 100% estimated kcal needs, 100% protein needs.   Pt tolerating with 0-50 mL residuals q 4 hrs.   Labs and meds reviewed.   No changes to TF today.  RD to reassess as changes made.    Pager: [phone].

## 2011-08-11 NOTE — Progress Notes (Signed)
eLink Physician-Brief Progress Note Patient Name: [name] DOB: 12-13-1955 MRN: [id number]  Date of Service  08/11/2011   HPI/Events of Note  hypokalemia   eICU Interventions  Potassium replaced   Intervention Category Intermediate Interventions: Electrolyte abnormality - evaluation and management  DETERDING,ELIZABETH 08/11/2011, 5:09 AM

## 2011-08-11 NOTE — Progress Notes (Signed)
Pt daughter came to visit pt. Home medications were sent home with the daughter who stated she would be going by her mothers home that day and would drop them off there.  [name]

## 2011-08-12 ENCOUNTER — Inpatient Hospital Stay (HOSPITAL_COMMUNITY): Payer: No Typology Code available for payment source

## 2011-08-12 LAB — BASIC METABOLIC PANEL
CO2: 35 mEq/L — ABNORMAL HIGH (ref 19–32)
GFR calc non Af Amer: >90 mL/min (ref >90)
Glucose, Bld: 112 mg/dL — ABNORMAL HIGH (ref 70–99)
Potassium: 3.5 mEq/L (ref 3.5–5.1)
Sodium: 142 mEq/L (ref 135–145)

## 2011-08-12 LAB — CBC
Hemoglobin: 7.9 g/dL — ABNORMAL LOW (ref 12.0–15.0)
MCHC: 30.7 g/dL (ref 30.0–36.0)
RBC: 3.34 MIL/uL — ABNORMAL LOW (ref 3.87–5.11)

## 2011-08-12 MED ORDER — CLONIDINE HCL 0.1 MG PO TABS
0.1000 mg | ORAL_TABLET | Freq: Three times a day (TID) | ORAL | Status: DC
  Administered 2011-08-12 – 2011-08-13 (×3): 0.1 mg via ORAL
  Filled 2011-08-12 (×12): qty 1

## 2011-08-12 MED ORDER — FUROSEMIDE 10 MG/ML IJ SOLN
40.0000 mg | Freq: Once | INTRAMUSCULAR | Status: AC
  Administered 2011-08-12: 40 mg via INTRAVENOUS
  Filled 2011-08-12: qty 4

## 2011-08-12 NOTE — Progress Notes (Signed)
eLink Physician-Brief Progress Note Patient Name: Maleena Eddleman DOB: 04/25/1956 MRN: [id number]  Date of Service  08/12/2011   HPI/Events of Note  dvt prophylaxis   eICU Interventions  scd's placed   Intervention Category Intermediate Interventions: Best-practice therapies (e.g. DVT, beta blocker, etc.)  Nathalia Wismer S. 08/12/2011, 3:36 PM

## 2011-08-12 NOTE — Progress Notes (Signed)
Critical Care Medicine Note  Patient Details:    39 yowf s/p elbow surgery 11/24 > discharged home with pain medications post operatively. On 11/25 pm Her fiance witnessed her take a pain pill and a sleeping pill.  Several hours later he was alerted to her moaning and gurgling.  He found her minimally responsive on her back in bed and appeared to have vomited.  He turned her to her side and cleared vomitus from her mouth.  He called for EMS and she received narcan with improvement in her mental status and was transported to the ED> intubated and admit by ccm as prob asp pna  Lines/tubes 11/25 OETT>>>11/28 11/28 (reintubation)>>> 11/26: right IJ CVL>>>  Microbiology/Sepsis markers: 11/26: MRSA : neg 11/26: BCX2>>> 11/26 sputum: moderate Klebsiella oxytoca (pan sens, but resistance to Unasyn)  Anti-infectives:  unasyn 11/26 (Asp)>>>11/28 clinda 11/26 (asp)>>> 11/27 11/28 rocephin (K oxytoca)>>>  Studies/Events:  11/27  bilat ue doppler neg   Subjective:    Overnight Issues:  Sedated on vent/ quite agitated with wean dioprovan  Objective:  Vital signs for last 24 hours: Temp:  [99.9 F (37.7 C)-100.8 F (38.2 C)] 100.2 F (37.9 C) (11/30 0900) Pulse Rate:  [79-112] 92  (11/30 0900) Resp:  [23-30] 30  (11/30 0900) BP: (100-142)/(54-71) 114/55 mmHg (11/30 0900) SpO2:  [88 %-96 %] 96 % (11/30 0900) FiO2 (%):  [40 %] 40 % (11/30 0900) Weight:  [214 lb 8.1 oz (97.3 kg)] 214 lb 8.1 oz (97.3 kg) (11/30 0500)  Hemodynamic parameters for last 24 hours: CVP:  [8 mmHg] 8 mmHg   Intake/Output this shift:  Intake/Output Summary (Last 24 hours) at 08/12/11 1119 Last data filed at 08/12/11 1051  Gross per 24 hour  Intake 1827.6 ml  Output   1975 ml  Net -147.4 ml     Physical Exam:  General: sedated on vent Neuro: Moves all 4 extremities spontaneously. HEENT/Neck: no JVD, orally intubated Resp: decreased in bases. Rhonchi at times.  CVS: Tachy, regular, no m/r/g GI:  soft, nontender, BS WNL, no r/g Skin: no rash Extremities: Wrapped left arm, L>R LE edema with pitting  PCXR:  11/30 No significant change in diffuse asymmetric air space disease as  noted above.   Labs  Lab 08/12/11 0451 08/11/11 0349 08/10/11 0409  NA 142 149* 145  K 3.5 3.3* 3.5  CL 102 106 107  CO2 35* 35* 31  BUN 23 19 17   CREATININE 0.70 0.74 0.61  GLUCOSE 112* 117* 125*    Lab 08/12/11 0451 08/11/11 0349 08/10/11 0409  HGB 7.9* 8.1* 7.9*  HCT 25.7* 27.0* 25.9*  WBC 6.9 9.8 8.4  PLT 233 225 189      Assessment/plan: Acute Respiratory failure with ALI and ARDS. S/P c/w aspiration event.+ Klebsiella oxitoca, ( intermittently sensitive to Unasyn). Rocephin started on 11/28. She   self extubated 11/28, and re-intubated with-in an hour. Her CXR has improved a little today. Plan: -cont Rocephin  -Continue bronchodilators -diprivan gtt.  -hope to get agitation better controlled then start weaning or consider precedex wean -try to keep neg i and o's if tol   AMS/encephalopathy, this is persistent. Plan:  -cont diprivan but wean as tol -Seroquel/clonazepam (concern for w/d though not clear exactly how much she uses cns drugs at home) -added low dose clonidine 11/30   Hypernatremia  Lab 08/12/11 0451 08/11/11 0349 08/10/11 0409  NA 142 149* 145  Plan: - d/c extra water 11/30    Anemia: no  evidence of bleeding  Lab 08/12/11 0451 08/11/11 0349 08/10/11 0409  HGB 7.9* 8.1* 7.9*    Plan: -transfuse for hemoglobin less than 7 -Continue proton pump inhibitor    LOS: 5 days     Discussed with fm at bedside   The patient is critically ill with multiple organ systems failure and requires high complexity decision making for assessment and support, frequent evaluation and titration of therapies, application of advanced monitoring technologies and extensive interpretation of multiple databases. Critical Care Time devoted to patient care services described in this  note is 45 minutes.   Sandrea Hughs, MD Pulmonary and Critical Care Medicine [facility] Cell [phone]

## 2011-08-12 NOTE — Plan of Care (Signed)
Problem: Phase I Progression Outcomes Goal: Initial discharge plan identified Outcome: Progressing Plan for discharge home

## 2011-08-12 NOTE — Plan of Care (Signed)
At 0600 Pt vomited tube feeds. Tube feeds were turned off and OG was turned to suction. Mouth and ET tube were suctioned. Dr. Delton Coombes made aware- orders are to continue to monitor patient and alert the doctors to this incident during rounds this morning.

## 2011-08-13 LAB — BLOOD GAS, ARTERIAL
Acid-Base Excess: 7.8 mmol/L — ABNORMAL HIGH (ref 0.0–2.0)
Bicarbonate: 31.9 mEq/L — ABNORMAL HIGH (ref 20.0–24.0)
FIO2: 0.4 %
O2 Saturation: 94.8 %
Patient temperature: 98.6
TCO2: 29.8 mmol/L (ref 0–100)

## 2011-08-13 LAB — BASIC METABOLIC PANEL
GFR calc Af Amer: >90 mL/min (ref >90)
GFR calc non Af Amer: >90 mL/min (ref >90)
Glucose, Bld: 94 mg/dL (ref 70–99)
Potassium: 3.1 mEq/L — ABNORMAL LOW (ref 3.5–5.1)
Sodium: 139 mEq/L (ref 135–145)

## 2011-08-13 LAB — PRO B NATRIURETIC PEPTIDE: Pro B Natriuretic peptide (BNP): 336 pg/mL — ABNORMAL HIGH (ref 0–125)

## 2011-08-13 LAB — CBC
MCV: 76.9 fL — ABNORMAL LOW (ref 78.0–100.0)
Platelets: 254 10*3/uL (ref 150–400)
RBC: 3.5 MIL/uL — ABNORMAL LOW (ref 3.87–5.11)
WBC: 8.5 10*3/uL (ref 4.0–10.5)

## 2011-08-13 MED ORDER — FUROSEMIDE 10 MG/ML IJ SOLN
40.0000 mg | Freq: Two times a day (BID) | INTRAMUSCULAR | Status: DC
  Administered 2011-08-13 – 2011-08-14 (×3): 40 mg via INTRAVENOUS
  Filled 2011-08-13 (×4): qty 4

## 2011-08-13 MED ORDER — POTASSIUM CHLORIDE 20 MEQ/15ML (10%) PO LIQD
40.0000 meq | Freq: Once | ORAL | Status: AC
  Administered 2011-08-13: 40 meq
  Filled 2011-08-13: qty 30

## 2011-08-13 MED ORDER — OXEPA PO LIQD
1000.0000 mL | ORAL | Status: DC
  Administered 2011-08-13 – 2011-08-14 (×2): 1000 mL
  Filled 2011-08-13 (×2): qty 1000

## 2011-08-13 NOTE — Plan of Care (Signed)
Patient turned and repositioned at 01:00 on 08/13/11. While laying the patient flat she began to cough and bite down on her ET tube. At this time, the patient dropped her O2 stats into the high eighties and her HR into the 40s. She returned to baseline within two minutes after elevating the HOB.

## 2011-08-13 NOTE — Plan of Care (Signed)
ELink RN notified that pt's BP is 89/44 (55), urinary output is slightly decreased, and potassium from morning labs is 3.1. Waiting for further instruction from Dr. Sharol Harness Pola Corn MD).

## 2011-08-13 NOTE — Progress Notes (Signed)
eLink Physician-Brief Progress Note Patient Name: [name] DOB: 1956/09/02 MRN: [id number]  Date of Service  08/13/2011   HPI/Events of Note  hypokalemia   eICU Interventions  repleted     Billy Fischer 08/13/2011, 6:31 AM

## 2011-08-13 NOTE — Plan of Care (Signed)
Problem: Phase I Progression Outcomes Goal: Progressing towards optiumm acitivities Outcome: Not Progressing Pt remains sedated. When sedation is turned down patient becomes severely agitated. While turning and repositioning the patient, she becomes agitated and has dropped her HR into the 40s once this shift. Will continue to reassess patient and work towards progressing her ability to tolerate activities.

## 2011-08-13 NOTE — Progress Notes (Signed)
Critical Care Medicine Note  Patient Details:    23 yowf s/p elbow surgery 11/24 > discharged home with pain medications post operatively. On 11/25 pm Her fiance witnessed her take a pain pill and a sleeping pill.  Several hours later he was alerted to her moaning and gurgling.  He found her minimally responsive on her back in bed and appeared to have vomited.  He turned her to her side and cleared vomitus from her mouth.  He called for EMS and she received narcan with improvement in her mental status and was transported to the ED> intubated and admit by ccm as prob asp pna  Lines/tubes 11/25 OETT>>>11/28 11/28 (reintubation)>>> 11/26: right IJ CVL>>>  Microbiology/Sepsis markers: 11/26: MRSA : neg 11/26: BCX2>>> 11/26 sputum: moderate Klebsiella oxytoca (pan sens, but resistance to Unasyn)  Anti-infectives:  unasyn 11/26 (Asp)>>>11/28 clinda 11/26 (asp)>>> 11/27 11/28 rocephin (K oxytoca)>>>  Studies/Events:  11/27  bilat ue doppler neg   Subjective:    Overnight Issues:  Sedated lasix given  Objective:  Vital signs for last 24 hours: Temp:  [99.3 F (37.4 C)-100.4 F (38 C)] 100 F (37.8 C) (12/01 0837) Pulse Rate:  [74-114] 114  (12/01 0837) Resp:  [26-30] 26  (12/01 0837) BP: (89-110)/(44-61) 96/50 mmHg (12/01 0800) SpO2:  [91 %-97 %] 93 % (12/01 0837) FiO2 (%):  [40 %] 40 % (12/01 0909) Weight:  [95.8 kg (211 lb 3.2 oz)] 211 lb 3.2 oz (95.8 kg) (12/01 0100)  Hemodynamic parameters for last 24 hours:     Intake/Output this shift:  Intake/Output Summary (Last 24 hours) at 08/13/11 0944 Last data filed at 08/13/11 0800  Gross per 24 hour  Intake 1937.1 ml  Output   2575 ml  Net -637.9 ml     Physical Exam:  General: sedated on vent Neuro: Moves all 4 extremities spontaneously. HEENT/Neck: no JVD, orally intubated Resp: coarse bilat CVS: Tachy, regular, no m/r/g GI: soft, nontender, BS WNL, no r/g Skin: no rash Extremities: Wrapped left arm, L>R LE  edema with pitting  PCXR:  11/30 No significant change in diffuse asymmetric air space disease as  noted above.   Labs  Lab 08/13/11 0455 08/12/11 0451 08/11/11 0349  NA 139 142 149*  K 3.1* 3.5 3.3*  CL 99 102 106  CO2 36* 35* 35*  BUN 21 23 19   CREATININE 0.78 0.70 0.74  GLUCOSE 94 112* 117*    Lab 08/13/11 0455 08/12/11 0451 08/11/11 0349  HGB 8.0* 7.9* 8.1*  HCT 26.9* 25.7* 27.0*  WBC 8.5 6.9 9.8  PLT 254 233 225      Assessment/plan: Acute Respiratory failure with ALI and ARDS. S/P c/w aspiration event.+ Klebsiella oxitoca, ( intermittently sensitive to Unasyn). Rocephin started on 11/28. She   self extubated 11/28, and re-intubated with-in an hour. Her CXR has improved a little today. Plan: -cont Rocephin, consider 8 days -Continue bronchodilators -diprivan gtt, hope to reduce slight and wean now cpap5  Ps 10  -hope to get agitation better controlled -try to keep neg i and o's if tol pcxr reviewed remain on low TV Add around the clock lasix to 1 lit balance goal  AMS/encephalopathy, this is persistent. Plan:  -cont diprivan but wean as tol, now on 15 mic, can wean on this -Seroquel/clonazepam (concern for w/d though not clear exactly how much she uses cns drugs at home) -added low dose clonidine 11/30   Hypernatremia  Lab 08/13/11 0455 08/12/11 0451 08/11/11 0349  NA  139 142 149*  Plan: - d/c extra water 11/30 d5w to kvo Lasix 40 q12h Chem in am  kcl supp x 2    Anemia: no evidence of bleeding  Lab 08/13/11 0455 08/12/11 0451 08/11/11 0349  HGB 8.0* 7.9* 8.1*    Plan: -transfuse for hemoglobin less than 7 -Continue proton pump inhibitor   Malnutrition Restart TF at 10 cc/hr oxepa    LOS: 6 days      The patient is critically ill with multiple organ systems failure and requires high complexity decision making for assessment and support, frequent evaluation and titration of therapies, application of advanced monitoring technologies and  extensive interpretation of multiple databases. Critical Care Time devoted to patient care services described in this note is 30 minutes.   Mcarthur Rossetti. Tyson Alias, MD, FACP Pgr: [phone] [place] Pulmonary & Critical [facility]

## 2011-08-14 ENCOUNTER — Inpatient Hospital Stay (HOSPITAL_COMMUNITY): Payer: No Typology Code available for payment source

## 2011-08-14 DIAGNOSIS — I509 Heart failure, unspecified: Secondary | ICD-10-CM

## 2011-08-14 DIAGNOSIS — Z9911 Dependence on respirator [ventilator] status: Secondary | ICD-10-CM

## 2011-08-14 DIAGNOSIS — J984 Other disorders of lung: Secondary | ICD-10-CM

## 2011-08-14 DIAGNOSIS — J9600 Acute respiratory failure, unspecified whether with hypoxia or hypercapnia: Secondary | ICD-10-CM

## 2011-08-14 LAB — DIFFERENTIAL
Basophils Relative: 1 % (ref 0–1)
Eosinophils Absolute: 0.3 10*3/uL (ref 0.0–0.7)
Eosinophils Relative: 3 % (ref 0–5)
Lymphs Abs: 2.5 10*3/uL (ref 0.7–4.0)
Monocytes Absolute: 1 10*3/uL (ref 0.1–1.0)
Neutro Abs: 5.9 10*3/uL (ref 1.7–7.7)
Neutrophils Relative %: 60 % (ref 43–77)

## 2011-08-14 LAB — BASIC METABOLIC PANEL
BUN: 23 mg/dL (ref 6–23)
Chloride: 96 mEq/L (ref 96–112)
Creatinine, Ser: 0.82 mg/dL (ref 0.50–1.10)
GFR calc Af Amer: >90 mL/min (ref >90)
Glucose, Bld: 103 mg/dL — ABNORMAL HIGH (ref 70–99)
Potassium: 3.4 mEq/L — ABNORMAL LOW (ref 3.5–5.1)

## 2011-08-14 LAB — CBC
Hemoglobin: 8.9 g/dL — ABNORMAL LOW (ref 12.0–15.0)
MCH: 23.9 pg — ABNORMAL LOW (ref 26.0–34.0)
MCHC: 30.7 g/dL (ref 30.0–36.0)
MCV: 77.7 fL — ABNORMAL LOW (ref 78.0–100.0)
RBC: 3.73 MIL/uL — ABNORMAL LOW (ref 3.87–5.11)

## 2011-08-14 LAB — CULTURE, BLOOD (ROUTINE X 2): Culture: NO GROWTH

## 2011-08-14 MED ORDER — QUETIAPINE FUMARATE 100 MG PO TABS
100.0000 mg | ORAL_TABLET | Freq: Two times a day (BID) | ORAL | Status: DC
  Administered 2011-08-14 – 2011-08-15 (×2): 100 mg via ORAL
  Filled 2011-08-14 (×3): qty 1

## 2011-08-14 MED ORDER — OXEPA PO LIQD
1000.0000 mL | ORAL | Status: DC
  Administered 2011-08-14 – 2011-08-17 (×3): 1000 mL
  Filled 2011-08-14 (×2): qty 1000

## 2011-08-14 MED ORDER — POTASSIUM CHLORIDE 20 MEQ/15ML (10%) PO LIQD
40.0000 meq | Freq: Once | ORAL | Status: AC
  Administered 2011-08-14: 40 meq
  Filled 2011-08-14: qty 30

## 2011-08-14 MED ORDER — PRO-STAT SUGAR FREE PO LIQD
30.0000 mL | Freq: Three times a day (TID) | ORAL | Status: DC
  Administered 2011-08-14 – 2011-08-22 (×20): 30 mL
  Filled 2011-08-14 (×26): qty 30

## 2011-08-14 MED ORDER — FUROSEMIDE 10 MG/ML IJ SOLN
40.0000 mg | Freq: Three times a day (TID) | INTRAMUSCULAR | Status: DC
  Administered 2011-08-14 – 2011-08-17 (×9): 40 mg via INTRAVENOUS
  Filled 2011-08-14 (×12): qty 4

## 2011-08-14 NOTE — Progress Notes (Signed)
Nutrition Follow Up/TF Consult  Tube feedings held yesterday due to vomiting. Restarted today.   TF: Oxepa @ 20 mL/hr providing 720 kcal, 30 g protein, 377 mL free water.  Supplement: ProStat TID  Patient remains intubated, propofol @ 22.8 ml/hr providing 602 kcal from fat. Patient not tolerating wean per RN.   Total nutrition providing 1570 kcal, 75 g protein, 377 mL free water.   Weight: 93.7 kg  Meds reviewed.  Labs:  CMP     Component Value Date/Time   NA 139 08/14/2011 0400   K 3.4* 08/14/2011 0400   CL 96 08/14/2011 0400   CO2 36* 08/14/2011 0400   GLUCOSE 103* 08/14/2011 0400   BUN 23 08/14/2011 0400   CREATININE 0.82 08/14/2011 0400   CALCIUM 8.7 08/14/2011 0400   PROT 6.0 08/11/2011 0349   ALBUMIN 2.4* 08/11/2011 0349   AST 29 08/11/2011 0349   ALT 17 08/11/2011 0349   ALKPHOS 95 08/11/2011 0349   BILITOT 0.5 08/11/2011 0349   GFRNONAA 80* 08/14/2011 0400   GFRAA >90 08/14/2011 0400   Nutrition Dx: Inadequate oral intake persists.   Goal: Initiation of TF with tolerance, met New Goal: Patient will meet 90-100% estimated needs with TF.   Monitor: TF adequacy/tolerance, weight trends, labs  Intervention:  1. Continue Oxepa at 20 mL/hr with ProStat TID, with propofol, providing 100% kcal and 90% protein needs.  2. RD will monitor propofol rate and adjust TF as necessary.   Dietitian#[phone] Linnell Fulling, RD, LDN

## 2011-08-14 NOTE — Progress Notes (Signed)
Critical Care Medicine Note  Patient Details:    12 yowf s/p elbow surgery 11/24 > discharged home with pain medications post operatively. On 11/25 pm Her fiance witnessed her take a pain pill and a sleeping pill.  Several hours later he was alerted to her moaning and gurgling.  He found her minimally responsive on her back in bed and appeared to have vomited.  He turned her to her side and cleared vomitus from her mouth.  He called for EMS and she received narcan with improvement in her mental status and was transported to the ED> intubated and admit by ccm as prob asp pna  Lines/tubes 11/25 OETT>>>11/28 11/28 (reintubation)>>> 11/26: right IJ CVL>>>  Microbiology/Sepsis markers: 11/26: MRSA : neg 11/26: BCX2>>> 11/26 sputum: moderate Klebsiella oxytoca (pan sens, but resistance to Unasyn)  Anti-infectives:  unasyn 11/26 (Asp)>>>11/28 clinda 11/26 (asp)>>> 11/27 11/28 rocephin (K oxytoca)>>>  Studies/Events:  11/27  bilat ue doppler neg   Subjective:    Overnight Issues: slight neg balance 300 cc  Objective:  Vital signs for last 24 hours: Temp:  [98.2 F (36.8 C)-100.8 F (38.2 C)] 98.8 F (37.1 C) (12/02 0900) Pulse Rate:  [73-120] 79  (12/02 0900) Resp:  [18-28] 22  (12/02 0900) BP: (88-115)/(46-61) 97/52 mmHg (12/02 0900) SpO2:  [91 %-100 %] 92 % (12/02 0900) FiO2 (%):  [40 %] 40 % (12/02 0834) Weight:  [93.7 kg (206 lb 9.1 oz)] 206 lb 9.1 oz (93.7 kg) (12/02 0500)  Hemodynamic parameters for last 24 hours:     Intake/Output this shift:  Intake/Output Summary (Last 24 hours) at 08/14/11 1034 Last data filed at 08/14/11 1000  Gross per 24 hour  Intake   1193 ml  Output   1565 ml  Net   -372 ml     Physical Exam:  General: sedated on vent Neuro: Moves all 4 extremities spontaneously. HEENT/Neck: jvd evident Resp: coarse bilat CVS: Tachy, regular, no m/r/g GI: soft, nontender, BS WNL, no r/g Skin: no rash Extremities: Wrapped left arm, L>R LE edema  with pitting  PCXR:  12/2 - slight improvement infiltrates  Labs  Lab 08/14/11 0400 08/13/11 0455 08/12/11 0451  NA 139 139 142  K 3.4* 3.1* 3.5  CL 96 99 102  CO2 36* 36* 35*  BUN 23 21 23   CREATININE 0.82 0.78 0.70  GLUCOSE 103* 94 112*    Lab 08/14/11 0400 08/13/11 0455 08/12/11 0451  HGB 8.9* 8.0* 7.9*  HCT 29.0* 26.9* 25.7*  WBC 9.8 8.5 6.9  PLT 286 254 233      Assessment/plan: Acute Respiratory failure with ALI and ARDS. S/P c/w aspiration event.+ Klebsiella oxitoca, ( intermittently sensitive to Unasyn). Rocephin started on 11/28. She   self extubated 11/28, and re-intubated with-in an hour. Her CXR has improved a little today. Plan: -cont Rocephin, consider 8 days -Continue bronchodilators -diprivan gtt, hope to reduce slight and wean Weaned with PS 5, fast, will re attempt with WUA in afternoon May need trach? -hope to get agitation better controlled -try to keep neg today further pcxr reviewed remain on low TV  AMS/encephalopathy, this is persistent. Plan:  -cont diprivan but wean as tol, now on 15 mic, can wean on this -Seroquel/clonazepam (concern for w/d though not clear exactly how much she uses cns drugs at home) -added low dose clonidine 11/30 Increase seroquel   Hypernatremia  Lab 08/14/11 0400 08/13/11 0455 08/12/11 0451  NA 139 139 142  Plan: - d/c extra water  11/30 d5w to kvo Lasix 40 to tid Chem in am  kcl supp x 1  Anemia: no evidence of bleeding  Lab 08/14/11 0400 08/13/11 0455 08/12/11 0451  HGB 8.9* 8.0* 7.9*    Plan: -transfuse for hemoglobin less than 7 -Continue proton pump inhibitor  Malnutrition Restart TF at 10 cc/hr, escalate oxepa    LOS: 7 days    The patient is critically ill with multiple organ systems failure and requires high complexity decision making for assessment and support, frequent evaluation and titration of therapies, application of advanced monitoring technologies and extensive interpretation of  multiple databases. Critical Care Time devoted to patient care services described in this note is 30 minutes.   Mcarthur Rossetti. Tyson Alias, MD, FACP Pgr: [phone] [facility]

## 2011-08-15 ENCOUNTER — Inpatient Hospital Stay (HOSPITAL_COMMUNITY): Payer: No Typology Code available for payment source

## 2011-08-15 LAB — MAGNESIUM: Magnesium: 2.4 mg/dL (ref 1.5–2.5)

## 2011-08-15 LAB — PHOSPHORUS: Phosphorus: 3.4 mg/dL (ref 2.3–4.6)

## 2011-08-15 LAB — DIFFERENTIAL
Basophils Relative: 1 % (ref 0–1)
Eosinophils Absolute: 0.3 10*3/uL (ref 0.0–0.7)
Eosinophils Relative: 3 % (ref 0–5)
Monocytes Absolute: 0.9 10*3/uL (ref 0.1–1.0)
Neutro Abs: 5.1 10*3/uL (ref 1.7–7.7)
Neutrophils Relative %: 61 % (ref 43–77)

## 2011-08-15 LAB — BASIC METABOLIC PANEL
BUN: 22 mg/dL (ref 6–23)
CO2: 39 mEq/L — ABNORMAL HIGH (ref 19–32)
Calcium: 8.5 mg/dL (ref 8.4–10.5)
Chloride: 97 mEq/L (ref 96–112)
Creatinine, Ser: 0.75 mg/dL (ref 0.50–1.10)
GFR calc Af Amer: >90 mL/min (ref >90)
GFR calc non Af Amer: >90 mL/min (ref >90)
Glucose, Bld: 109 mg/dL — ABNORMAL HIGH (ref 70–99)
Potassium: 3.2 mEq/L — ABNORMAL LOW (ref 3.5–5.1)
Sodium: 141 mEq/L (ref 135–145)

## 2011-08-15 LAB — CBC
HCT: 28.7 % — ABNORMAL LOW (ref 36.0–46.0)
Hemoglobin: 8.4 g/dL — ABNORMAL LOW (ref 12.0–15.0)
MCH: 22.8 pg — ABNORMAL LOW (ref 26.0–34.0)
MCHC: 29.3 g/dL — ABNORMAL LOW (ref 30.0–36.0)
MCV: 78 fL (ref 78.0–100.0)
Platelets: 321 10*3/uL (ref 150–400)
RBC: 3.68 MIL/uL — ABNORMAL LOW (ref 3.87–5.11)
RDW: 18.8 % — ABNORMAL HIGH (ref 11.5–15.5)
WBC: 8.6 10*3/uL (ref 4.0–10.5)

## 2011-08-15 MED ORDER — POTASSIUM CHLORIDE 20 MEQ/15ML (10%) PO LIQD
40.0000 meq | Freq: Once | ORAL | Status: AC
  Administered 2011-08-15: 40 meq
  Filled 2011-08-15: qty 30

## 2011-08-15 MED ORDER — QUETIAPINE FUMARATE 200 MG PO TABS
200.0000 mg | ORAL_TABLET | Freq: Two times a day (BID) | ORAL | Status: DC
  Administered 2011-08-15 – 2011-08-16 (×2): 200 mg via ORAL
  Filled 2011-08-15 (×3): qty 1

## 2011-08-15 MED ORDER — CLONAZEPAM 1 MG PO TABS
1.0000 mg | ORAL_TABLET | Freq: Two times a day (BID) | ORAL | Status: DC
  Administered 2011-08-15 – 2011-08-17 (×4): 1 mg via ORAL
  Filled 2011-08-15 (×4): qty 1

## 2011-08-15 MED ORDER — PROMETHAZINE HCL 25 MG/ML IJ SOLN: 12.5000 mg | INTRAMUSCULAR | Status: DC | PRN

## 2011-08-15 MED ORDER — CLONIDINE HCL 0.1 MG PO TABS
0.1000 mg | ORAL_TABLET | Freq: Two times a day (BID) | ORAL | Status: DC
  Administered 2011-08-16 – 2011-08-21 (×9): 0.1 mg via ORAL
  Filled 2011-08-15 (×13): qty 1

## 2011-08-15 NOTE — Progress Notes (Signed)
CSW met with Pt's fiance, Fredrik Cove to offer support and assess needs. He shared they have a 53yo son, Molli Hazard together. Roger had many questions about some financial concerns and how to handle. CSW will assist in addressing these concerns. He appeared to have an adequate understanding of Pts condition, but shared may appropriate stressors of coping with a wife in ICU. Family appears to have adequate support.  CSW will follow and assist.  Jodelle Red 08/15/2011 12:46 PM #[phone]

## 2011-08-15 NOTE — Progress Notes (Signed)
Critical Care Medicine Note  Patient Details:    26 yowf [name]/p elbow surgery 11/24 > discharged home with pain medications post operatively.Adm 11/25 with aspiration pneumonia  Lines/tubes 11/25 OETT>>>11/28 11/28 (reintubation)>>> 11/26: right IJ CVL>>>  Microbiology/Sepsis markers: 11/26: MRSA : neg 11/26: BCX2>>> 11/26 sputum: moderate Klebsiella oxytoca (pan sens, but resistance to Unasyn)  Anti-infectives:  unasyn 11/26 (Asp)>>>11/28 clinda 11/26 (asp)>>> 11/27 11/28 rocephin (K oxytoca)>>>  Studies/Events:  11/27  bilat ue doppler neg 12/3 agitated with WUA   Subjective:    Overnight Issues: agitated with wua  Objective:  Vital signs for last 24 hours: Temp:  [98.2 F (36.8 C)-100 F (37.8 C)] 99.1 F (37.3 C) (12/03 1000) Pulse Rate:  [78-100] 83  (12/03 1000) Resp:  [12-25] 22  (12/03 1000) BP: (93-114)/(43-60) 101/50 mmHg (12/03 0900) SpO2:  [91 %-96 %] 96 % (12/03 1000) FiO2 (%):  [40 %] 40 % (12/03 1001) Weight:  [206 lb 2.1 oz (93.5 kg)] 206 lb 2.1 oz (93.5 kg) (12/03 0400)  Hemodynamic parameters for last 24 hours:     Intake/Output this shift:  Intake/Output Summary (Last 24 hours) at 08/15/11 1017 Last data filed at 08/15/11 0800  Gross per 24 hour  Intake 1176.5 ml  Output   3015 ml  Net -1838.5 ml     Physical Exam:  General: sedated on vent Neuro: Moves all 4 extremities spontaneously. HEENT/Neck: jvd evident Resp: coarse bilat CVS: Tachy, regular, no m/r/g GI: soft, nontender, BS WNL, no r/g Skin: no rash Extremities: Wrapped left arm, L>R LE edema with pitting  PCXR:  12/3: Slight interval worsening in diffuse bilateral air space disease  with bilateral pleural effusions   Labs  Lab 08/15/11 0400 08/14/11 0400 08/13/11 0455  NA 141 139 139  K 3.2* 3.4* 3.1*  CL 97 96 99  CO2 39* 36* 36*  BUN 22 23 21   CREATININE 0.75 0.82 0.78  GLUCOSE 109* 103* 94    Lab 08/15/11 0400 08/14/11 0400 08/13/11 0455  HGB 8.4* 8.9*  8.0*  HCT 28.7* 29.0* 26.9*  WBC 8.6 9.8 8.5  PLT 321 286 254      Assessment/plan: Acute Respiratory failure with ALI and ARDS. [name]/P c/w aspiration event.+ Klebsiella oxitoca, ( intermittently sensitive to Unasyn). Rocephin started on 11/28. She   self extubated 11/28, and re-intubated with-in an hour. Her CXR has improved a little today. Plan: -cont Rocephin, consider 8 days -Continue bronchodilators -diprivan gtt, hope to reduce slight and wean if agitation controlled May need trach? -try to keep neg today further pcxr reviewed remain on low TV  AMS/encephalopathy, this is persistent. Plan:  -cont diprivan but wean as tol - Increase Seroquel - qT ok/ ct clonazepam (concern for w/d though not clear exactly how much she uses cns drugs at home) -added low dose clonidine 11/30    Hypernatremia  Lab 08/15/11 0400 08/14/11 0400 08/13/11 0455  NA 141 139 139  Plan: - d/c extra water 11/30 d5w to kvo Lasix 40 to tid Chem in am  kcl supp as needed  Anemia: no evidence of bleeding  Lab 08/15/11 0400 08/14/11 0400 08/13/11 0455  HGB 8.4* 8.9* 8.0*    Plan: -transfuse for hemoglobin less than 7 -Continue proton pump inhibitor  Malnutrition Restart TF at 10 cc/hr, escalate oxepa    LOS: 8 days   [name],[name]   Care during the described time interval was provided by me and/or other providers on the critical care team.  I  have reviewed this patient'[name] available data, including medical history, events of note, physical examination and test results as part of my evaluation  CC time x 30 mins  Carole Doner V.

## 2011-08-16 ENCOUNTER — Inpatient Hospital Stay (HOSPITAL_COMMUNITY): Payer: No Typology Code available for payment source

## 2011-08-16 DIAGNOSIS — J984 Other disorders of lung: Secondary | ICD-10-CM

## 2011-08-16 DIAGNOSIS — J9600 Acute respiratory failure, unspecified whether with hypoxia or hypercapnia: Secondary | ICD-10-CM

## 2011-08-16 DIAGNOSIS — Z9911 Dependence on respirator [ventilator] status: Secondary | ICD-10-CM

## 2011-08-16 LAB — BASIC METABOLIC PANEL
CO2: 38 mEq/L — ABNORMAL HIGH (ref 19–32)
Chloride: 96 mEq/L (ref 96–112)
GFR calc Af Amer: >90 mL/min (ref >90)
Potassium: 3.1 mEq/L — ABNORMAL LOW (ref 3.5–5.1)

## 2011-08-16 LAB — CBC
HCT: 29 % — ABNORMAL LOW (ref 36.0–46.0)
Platelets: 361 10*3/uL (ref 150–400)
RBC: 3.64 MIL/uL — ABNORMAL LOW (ref 3.87–5.11)
RDW: 19.1 % — ABNORMAL HIGH (ref 11.5–15.5)
WBC: 7.7 10*3/uL (ref 4.0–10.5)

## 2011-08-16 LAB — MAGNESIUM: Magnesium: 2.4 mg/dL (ref 1.5–2.5)

## 2011-08-16 MED ORDER — QUETIAPINE FUMARATE 200 MG PO TABS
200.0000 mg | ORAL_TABLET | Freq: Three times a day (TID) | ORAL | Status: DC
  Administered 2011-08-16 – 2011-08-17 (×3): 200 mg via ORAL
  Filled 2011-08-16 (×5): qty 1

## 2011-08-16 MED ORDER — POTASSIUM CHLORIDE 20 MEQ/15ML (10%) PO LIQD
40.0000 meq | Freq: Once | ORAL | Status: AC
  Administered 2011-08-16: 40 meq
  Filled 2011-08-16: qty 30

## 2011-08-16 NOTE — Progress Notes (Signed)
1310 PT PLACED BACK ON PRVC DUE TO APNEA

## 2011-08-16 NOTE — Progress Notes (Signed)
When pt arouses and coughs, she gags and has vagal response with bradycardia in 40s  2nd degree HB type 2  That subsides after  Pt quits coughing.  Brett Canales Minor NP informed.  [name]

## 2011-08-16 NOTE — Progress Notes (Signed)
Nutrition Follow-up:  Pt remains intubated, continues TFs of Oxepa @ 20 mL/hr Prostat TID via OGT.  Diprivan continues  At 11.2 mL/hr providing 295 kcal/day. Tube feeds and medications providing 1231 kcal, 75 g protein, 376 mL free water. Discussed with RN.  Attempting to wean sedation, however, pt not tolerating process well.  When sedation decreased, pt tends to vagal down with gagging/coughing leading to small amount of regurgitation of TFs.  Residuals 0-50 mL q 4 hrs.  No documented BMs.  Scheduled Meds:   . cefTRIAXone (ROCEPHIN)  IV  1 g Intravenous Q24H  . chlorhexidine  15 mL Mouth/Throat BID  . clonazePAM  1 mg Oral BID  . cloNIDine  0.1 mg Oral BID  . feeding supplement  30 mL Per Tube TID WC  . furosemide  40 mg Intravenous TID  . pantoprazole sodium  40 mg Per Tube Q1200  . potassium chloride  40 mEq Per Tube Once  . QUEtiapine  200 mg Oral BID  . DISCONTD: cloNIDine  0.1 mg Oral TID  . DISCONTD: QUEtiapine  100 mg Oral BID   Continuous Infusions:   . dextrose 20 mL/hr at 08/16/11 0900  . feeding supplement (OXEPA) 1,000 mL (08/16/11 0440)  . fentaNYL infusion INTRAVENOUS 100 mcg/hr (08/16/11 1000)  . propofol 20 mcg/kg/min (08/16/11 1000)   PRN Meds:.albuterol, promethazine CMP     Component Value Date/Time   NA 142 08/16/2011 0400   K 3.1* 08/16/2011 0400   CL 96 08/16/2011 0400   CO2 38* 08/16/2011 0400   GLUCOSE 149* 08/16/2011 0400   BUN 22 08/16/2011 0400   CREATININE 0.75 08/16/2011 0400   CALCIUM 8.5 08/16/2011 0400   PROT 6.0 08/11/2011 0349   ALBUMIN 2.4* 08/11/2011 0349   AST 29 08/11/2011 0349   ALT 17 08/11/2011 0349   ALKPHOS 95 08/11/2011 0349   BILITOT 0.5 08/11/2011 0349   GFRNONAA >90 08/16/2011 0400   GFRAA >90 08/16/2011 0400   Inadequate oral intake ongoing.  Intervention: 1.  Enteral nutrition; pt currently meeting 86% of kcal needs, 91% protein needs.  With weaning of sedation, pt needs to increase TFs, however, pt regurgitating TFs when  sedation turned down. Recommend advancing tube post-pyloric as pt is at high risk of aspiration due to regurgitation, admitted with aspiration pneumonia. If tube advanced, increase TFs to Oxepa @ 30 mL/hr. Pt has been permissively underfed for approximately 1 week per ASPEN/JPEN guidelines. Need to consider increasing nutritional provision if pt to remain NPO with tubefeeds only in near future.  2.  Nutrition-related medication;  Pt may need bowel regimen if no BM within 24 hrs.  No documented BM since admission.   Monitoring: 1.  Enteral nutrition; tolerance, meeting, continue 2.  Gastrointestinal; stool output.  Pager:  [phone]

## 2011-08-16 NOTE — Progress Notes (Signed)
eLink Physician-Brief Progress Note Patient Name: [name] DOB: [birth year] MRN: [id number]  Date of Service  08/16/2011   HPI/Events of Note   Hypokalemia  eICU Interventions  Potassium replaced   Intervention Category Intermediate Interventions: Best-practice therapies (e.g. DVT, beta blocker, etc.) Minor Interventions: Electrolytes abnormality - evaluation and management  DETERDING,ELIZABETH 08/16/2011, 5:00 AM

## 2011-08-16 NOTE — Progress Notes (Addendum)
Critical Care Medicine Note  Patient Details:    19 yowf s/p elbow surgery 11/24 > discharged home with pain medications post operatively.Adm 11/25 with aspiration pneumonia  Lines/tubes 11/25 OETT>>>11/28 11/28 (reintubation)>>> 11/26: right IJ CVL>>>  Microbiology/Sepsis markers: 11/26: MRSA : neg 11/26: BCX2>>> 11/26 sputum: moderate Klebsiella oxytoca (pan sens, but resistance to Unasyn)  Anti-infectives:  unasyn 11/26 (Asp)>>>11/28 clinda 11/26 (asp)>>> 11/27 11/28 rocephin (K oxytoca)>>>  Studies/Events:  11/27  bilat ue doppler neg 12/3 agitated with WUA 12/4 bradys down with weaning attempts, tolerated Ps 5/5   Subjective:    Overnight Issues: agitated with wua  Objective:  Vital signs for last 24 hours: Temp:  [97.7 F (36.5 C)-99.7 F (37.6 C)] 99.1 F (37.3 C) (12/04 1212) Pulse Rate:  [77-94] 81  (12/04 1000) Resp:  [21-23] 22  (12/04 1000) BP: (87-110)/(44-59) 97/52 mmHg (12/04 1000) SpO2:  [94 %-97 %] 97 % (12/04 1000) FiO2 (%):  [40 %] 40 % (12/04 1212) Weight:  [205 lb 0.4 oz (93 kg)] 205 lb 0.4 oz (93 kg) (12/04 0400)  Hemodynamic parameters for last 24 hours: CVP:  [10 mmHg-27 mmHg] 10 mmHg   Intake/Output this shift:  Intake/Output Summary (Last 24 hours) at 08/16/11 1441 Last data filed at 08/16/11 1000  Gross per 24 hour  Intake    820 ml  Output   1495 ml  Net   -675 ml     Physical Exam:  General: sedated on vent Neuro: Moves all 4 extremities spontaneously. HEENT/Neck: jvd evident Resp: coarse bilat CVS: Tachy, regular, no m/r/g GI: soft, nontender, BS WNL, no r/g Skin: no rash Extremities: Wrapped left arm, L>R LE edema with pitting  PCXR:  12/4: Improved airspace disease and aeration bilaterally.    Labs  Lab 08/16/11 0400 08/15/11 0400 08/14/11 0400  NA 142 141 139  K 3.1* 3.2* 3.4*  CL 96 97 96  CO2 38* 39* 36*  BUN 22 22 23   CREATININE 0.75 0.75 0.82  GLUCOSE 149* 109* 103*    Lab 08/16/11 0400 08/15/11  0400 08/14/11 0400  HGB 8.7* 8.4* 8.9*  HCT 29.0* 28.7* 29.0*  WBC 7.7 8.6 9.8  PLT 361 321 286      Assessment/plan: Acute Respiratory failure with ALI and ARDS. S/P c/w aspiration event.+ Klebsiella oxitoca, ( intermittently sensitive to Unasyn). Rocephin started on 11/28. She   self extubated 11/28, and re-intubated with-in an hour. Her CXR has improved a little today. Plan: -cont Rocephin, consider 8 days -Continue bronchodilators -diprivan gtt, hope to reduce slight and wean if agitation controlled -try to keep neg today further pcxr reviewed remain on low TV Prefer one trial of extubation before considering  trach  AMS/encephalopathy, this is persistent. Plan:  -cont diprivan but wean as tol - Increase Seroquel 200 tid - qT ok/ ct clonazepam (concern for w/d though not clear exactly how much she uses cns drugs at home) -added low dose clonidine 11/30    Hypernatremia  Lab 08/16/11 0400 08/15/11 0400 08/14/11 0400  NA 142 141 139  Plan: - d/c extra water 11/30 d5w to kvo Lasix 40 to bid, aim for CVP 4 range Chem in am  kcl supp as needed  Anemia: no evidence of bleeding  Lab 08/16/11 0400 08/15/11 0400 08/14/11 0400  HGB 8.7* 8.4* 8.9*    Plan: -transfuse for hemoglobin less than 7 -Continue proton pump inhibitor  Elbow surgery - call dr Melvyn Novas to reassess  Malnutrition Restart TF at 10 cc/hr,  escalate oxepa    LOS: 9 days   MINOR,WILLIAM S  Care during the described time interval was provided by me and/or other providers on the critical care team.  I have reviewed this patient's available data, including medical history, events of note, physical examination and test results as part of my evaluation  CC time x  ALVA,RAKESH V.

## 2011-08-17 ENCOUNTER — Inpatient Hospital Stay (HOSPITAL_COMMUNITY): Payer: No Typology Code available for payment source

## 2011-08-17 DIAGNOSIS — G9340 Encephalopathy, unspecified: Secondary | ICD-10-CM

## 2011-08-17 LAB — BLOOD GAS, ARTERIAL
Acid-Base Excess: 13 mmol/L — ABNORMAL HIGH (ref 0.0–2.0)
Drawn by: 340271
Mode: POSITIVE
PEEP: 5 cmH2O
TCO2: 35.6 mmol/L (ref 0–100)
pCO2 arterial: 54.9 mmHg — ABNORMAL HIGH (ref 35.0–45.0)
pH, Arterial: 7.459 — ABNORMAL HIGH (ref 7.350–7.400)
pO2, Arterial: 51 mmHg — ABNORMAL LOW (ref 80.0–100.0)

## 2011-08-17 LAB — CBC
MCH: 23.2 pg — ABNORMAL LOW (ref 26.0–34.0)
MCHC: 29.7 g/dL — ABNORMAL LOW (ref 30.0–36.0)
MCV: 78.1 fL (ref 78.0–100.0)
Platelets: 371 10*3/uL (ref 150–400)
RBC: 3.97 MIL/uL (ref 3.87–5.11)

## 2011-08-17 LAB — BASIC METABOLIC PANEL
Chloride: 95 mEq/L — ABNORMAL LOW (ref 96–112)
GFR calc Af Amer: >90 mL/min (ref >90)
GFR calc non Af Amer: >90 mL/min (ref >90)
Potassium: 3.1 mEq/L — ABNORMAL LOW (ref 3.5–5.1)
Sodium: 141 mEq/L (ref 135–145)

## 2011-08-17 LAB — PHOSPHORUS: Phosphorus: 3.5 mg/dL (ref 2.3–4.6)

## 2011-08-17 MED ORDER — FUROSEMIDE 8 MG/ML PO SOLN: 40.0000 mg | Freq: Two times a day (BID) | ORAL | Status: DC

## 2011-08-17 MED ORDER — SODIUM CHLORIDE 0.9 % IV SOLN
500.0000 mg | Freq: Four times a day (QID) | INTRAVENOUS | Status: DC
  Administered 2011-08-17 – 2011-08-25 (×31): 500 mg via INTRAVENOUS
  Filled 2011-08-17 (×36): qty 500

## 2011-08-17 MED ORDER — POTASSIUM CHLORIDE 20 MEQ/15ML (10%) PO LIQD
40.0000 meq | Freq: Once | ORAL | Status: AC
  Administered 2011-08-17: 40 meq via ORAL
  Filled 2011-08-17: qty 30

## 2011-08-17 MED ORDER — VANCOMYCIN HCL IN DEXTROSE 1-5 GM/200ML-% IV SOLN
1000.0000 mg | Freq: Three times a day (TID) | INTRAVENOUS | Status: DC
  Administered 2011-08-17 – 2011-08-21 (×12): 1000 mg via INTRAVENOUS
  Filled 2011-08-17 (×16): qty 200

## 2011-08-17 MED ORDER — ACETAMINOPHEN 325 MG PO TABS
650.0000 mg | ORAL_TABLET | ORAL | Status: DC | PRN
  Administered 2011-08-17 – 2011-08-28 (×11): 650 mg via ORAL
  Filled 2011-08-17 (×11): qty 2

## 2011-08-17 MED ORDER — ACETYLCYSTEINE 10 % IN SOLN
2.0000 mL | Freq: Four times a day (QID) | RESPIRATORY_TRACT | Status: DC
  Filled 2011-08-17 (×2): qty 4

## 2011-08-17 MED ORDER — ACETYLCYSTEINE 20 % IN SOLN
1.0000 mL | Freq: Four times a day (QID) | RESPIRATORY_TRACT | Status: DC
  Administered 2011-08-17: 1 mL via RESPIRATORY_TRACT
  Administered 2011-08-18: 05:00:00 via RESPIRATORY_TRACT
  Administered 2011-08-18: 1 mL via RESPIRATORY_TRACT
  Filled 2011-08-17 (×10): qty 4

## 2011-08-17 MED ORDER — FUROSEMIDE 8 MG/ML PO SOLN
40.0000 mg | Freq: Two times a day (BID) | ORAL | Status: DC
  Administered 2011-08-17 – 2011-08-22 (×11): 40 mg
  Filled 2011-08-17 (×14): qty 5

## 2011-08-17 MED ORDER — SODIUM CHLORIDE 0.9 % IV SOLN
INTRAVENOUS | Status: DC
  Administered 2011-08-17: 10 mL/h via INTRAVENOUS
  Administered 2011-08-20 – 2011-08-21 (×2): via INTRAVENOUS
  Administered 2011-08-23: 20 mL/h via INTRAVENOUS
  Administered 2011-08-24: 19:00:00 via INTRAVENOUS
  Administered 2011-08-25: 20 mL/h via INTRAVENOUS
  Administered 2011-08-27 – 2011-08-29 (×2): via INTRAVENOUS

## 2011-08-17 MED ORDER — QUETIAPINE FUMARATE 200 MG PO TABS
200.0000 mg | ORAL_TABLET | Freq: Two times a day (BID) | ORAL | Status: DC
  Administered 2011-08-17 – 2011-08-21 (×7): 200 mg via ORAL
  Filled 2011-08-17 (×9): qty 1

## 2011-08-17 MED ORDER — POTASSIUM CHLORIDE CRYS ER 20 MEQ PO TBCR: 40.0000 meq | EXTENDED_RELEASE_TABLET | Freq: Once | ORAL | Status: DC

## 2011-08-17 NOTE — Procedures (Signed)
Mini BAL Procedure Note [name] [id number] 12-Mar-1956  Procedure: Mini Bronchial Alveolar Lavage  Procedure Details: In preparation for procedure, Patient hyper-oxygenated with 100 % FiO2 and Saline given via ETT (40 ml) Sterile Technique used:gloves, gown and mask Amount of Saline administered: 40 (ml) Specimen amount collected: 5 (ml)  Evaluation: BP 89/45  Pulse 98  Temp(Src) 103.3 F (39.6 C) (Core (Comment))  Resp 22  Ht 5\' 1"  (1.549 m)  Wt 198 lb 6.6 oz (90 kg)  BMI 37.49 kg/m2  SpO2 100% O2 sats: stable throughout Breath Sounds: Rhonch Patient's Current Condition: stable Complications: No apparent complications Patient did tolerate procedure well.  Assisted by Elon Alas, RRT, RCP.  Antoine Poche 08/17/2011, 11:28 PM

## 2011-08-17 NOTE — Progress Notes (Signed)
Critical Care Medicine Note  Patient Details:    89 yowf s/p elbow surgery 11/24 > discharged home with pain medications post operatively.Adm 11/25 with aspiration pneumonia  Lines/tubes 11/25 OETT>>>11/28 11/28 (reintubation)>>> 11/26: right IJ CVL>>>  Microbiology/Sepsis markers: 11/26: MRSA : neg 11/26: BCX2>>>neg 11/26 sputum: moderate Klebsiella oxytoca (pan sens, but resistance to Unasyn)  Anti-infectives:  unasyn 11/26 (Asp)>>>11/28 clinda 11/26 (asp)>>> 11/27 11/28 rocephin (K oxytoca)>>>  Studies/Events:  11/27  bilat ue doppler neg 12/3 agitated with WUA 12/4 bradys down with weaning attempts, tolerated Ps 5/5 12/5 reattempts weaning  Subjective:    Overnight Issues: less agitated with wua  Objective:  Vital signs for last 24 hours: Temp:  [98.4 F (36.9 C)-101.1 F (38.4 C)] 101.1 F (38.4 C) (12/05 1000) Pulse Rate:  [81-115] 115  (12/05 1000) Resp:  [11-22] 17  (12/05 1000) BP: (98-138)/(49-70) 135/70 mmHg (12/05 1000) SpO2:  [93 %-100 %] 97 % (12/05 1000) FiO2 (%):  [30 %-40 %] 40 % (12/05 0800) Weight:  [198 lb 6.6 oz (90 kg)] 198 lb 6.6 oz (90 kg) (12/05 0500)  Hemodynamic parameters for last 24 hours: CVP:  [9 mmHg] 9 mmHg   Intake/Output this shift:  Intake/Output Summary (Last 24 hours) at 08/17/11 1034 Last data filed at 08/17/11 1000  Gross per 24 hour  Intake   1062 ml  Output   2365 ml  Net  -1303 ml     Physical Exam:  General: off sedation on vent Neuro: Moves all 4 extremities spontaneously. Sleepy HEENT/Neck: jvd evident Resp: coarse bilat CVS: Tachy, regular, no m/r/g GI: soft, nontender, BS WNL, no r/g Skin: no rash Extremities: Wrapped left arm, L>R LE edema with pitting  PCXR:  12/5: Taking into account differences in technique with rotation of the patient towards left, air space disease appears relatively similar. Endotracheal tube has been retracted now 4.1 cm above the carina.       Labs  Lab 08/17/11  0545 08/16/11 0400 08/15/11 0400  NA 141 142 141  K 3.1* 3.1* 3.2*  CL 95* 96 97  CO2 39* 38* 39*  BUN 22 22 22   CREATININE 0.73 0.75 0.75  GLUCOSE 120* 149* 109*    Lab 08/17/11 0545 08/16/11 0400 08/15/11 0400  HGB 9.2* 8.7* 8.4*  HCT 31.0* 29.0* 28.7*  WBC 10.2 7.7 8.6  PLT 371 361 321      Assessment/plan: Acute Respiratory failure with ALI and ARDS. S/P c/w aspiration event.+ Klebsiella oxitoca, ( intermittently sensitive to Unasyn). Rocephin started on 11/28. She   self extubated 11/28, and re-intubated with-in an hour. Her CXR has improved a little today. Plan: -cont Rocephin, consider 8 days -Continue bronchodilators - dc diprivan gtt, hope to wean if agitation controlled -try to keep neg  further Prefer one trial of extubation before considering  trach  AMS/encephalopathy, this is persistent. Plan:  -dc diprivan  - Increased Seroquel 200 tid 12/5 - qT ok/ ct clonazepam (concern for w/d though not clear exactly how much she uses cns drugs at home) -added low dose clonidine 11/30    Hypernatremia  Lab 08/17/11 0545 08/16/11 0400 08/15/11 0400  NA 141 142 141  Plan: - d/c extra water 11/30 d5w to kvo Lasix 40 to bid, aim for CVP 4 range Chem as needed kcl supp as needed  Anemia: no evidence of bleeding  Lab 08/17/11 0545 08/16/11 0400 08/15/11 0400  HGB 9.2* 8.7* 8.4*    Plan: -transfuse for hemoglobin less than 7 -  Continue proton pump inhibitor  Elbow surgery - called dr Melvyn Novas to reassess 12/4 .  Malnutrition Restart TF at 10 cc/hr, escalate oxepa    LOS: 10 days   MINOR,WILLIAM S  Care during the described time interval was provided by me and/or other providers on the critical care team.  I have reviewed this patient's available data, including medical history, events of note, physical examination and test results as part of my evaluation Hope to extubate today when awake CC time x 30 mins

## 2011-08-17 NOTE — Progress Notes (Signed)
ANTIBIOTIC CONSULT NOTE - INITIAL  Pharmacy Consult for Vancomycin Indication: Line sepsis  Allergies  Allergen Reactions  . Meperidine Hcl Itching    REACTION: hives, itich    Patient Measurements: Height:  (5'4") Weight: 198 lb 6.6 oz (90 kg) IBW/kg (Calculated) : 47.8     Vital Signs: Temp: 102 F (38.9 C) (12/05 1400) Temp src: Core (Comment) (12/05 1200) BP: 122/69 mmHg (12/05 1400) Pulse Rate: 113  (12/05 1400) Intake/Output from previous day: 12/04 0701 - 12/05 0700 In: 1102 [I.V.:588; NG/GT:460; IV Piggyback:54] Out: 2400 [Urine:2400] Intake/Output from this shift: Total I/O In: 180 [I.V.:160; NG/GT:20] Out: 950 [Urine:950]  Labs:  [facility] 08/17/11 0545 08/16/11 0400 08/15/11 0400  WBC 10.2 7.7 8.6  HGB 9.2* 8.7* 8.4*  PLT 371 361 321  LABCREA -- -- --  CREATININE 0.73 0.75 0.75   Estimated Creatinine Clearance: 83.1 ml/min (by C-G formula based on Cr of 0.73). No results found for this basename: VANCOTROUGH:2,VANCOPEAK:2,VANCORANDOM:2,GENTTROUGH:2,GENTPEAK:2,GENTRANDOM:2,TOBRATROUGH:2,TOBRAPEAK:2,TOBRARND:2,AMIKACINPEAK:2,AMIKACINTROU:2,AMIKACIN:2, in the last 72 hours   Microbiology: Recent Results (from the past 720 hour(s))  SURGICAL PCR SCREEN     Status: Normal   Collection Time   08/05/11  1:51 PM      Component Value Range Status Comment   MRSA, PCR NEGATIVE  NEGATIVE  Final    Staphylococcus aureus NEGATIVE  NEGATIVE  Final   MRSA PCR SCREENING     Status: Normal   Collection Time   08/08/11  2:23 AM      Component Value Range Status Comment   MRSA by PCR NEGATIVE  NEGATIVE  Final   CULTURE, BLOOD (ROUTINE X 2)     Status: Normal   Collection Time   08/08/11  3:15 AM      Component Value Range Status Comment   Specimen Description BLOOD RIGHT ANTECUBITAL   Final    Special Requests BOTTLES DRAWN AEROBIC AND ANAEROBIC 5 CC EA   Final    Setup Time [id number]   Final    Culture NO GROWTH 5 DAYS   Final    Report Status 08/14/2011  FINAL   Final   CULTURE, BLOOD (ROUTINE X 2)     Status: Normal   Collection Time   08/08/11  3:20 AM      Component Value Range Status Comment   Specimen Description BLOOD RIGHT HAND   Final    Special Requests BOTTLES DRAWN AEROBIC AND ANAEROBIC 5 CC EA   Final    Setup Time [id number]   Final    Culture NO GROWTH 5 DAYS   Final    Report Status 08/14/2011 FINAL   Final   CULTURE, RESPIRATORY     Status: Normal   Collection Time   08/08/11  4:39 AM      Component Value Range Status Comment   Specimen Description ENDOTRACHEAL   Final    Special Requests NONE   Final    Gram Stain     Final    Value: MODERATE WBC PRESENT,BOTH PMN AND MONONUCLEAR     NO SQUAMOUS EPITHELIAL CELLS SEEN     RARE GRAM POSITIVE RODS   Culture MODERATE KLEBSIELLA OXYTOCA   Final    Report Status 08/10/2011 FINAL   Final    Organism ID, Bacteria KLEBSIELLA OXYTOCA   Final     Medical History: Past Medical History  Diagnosis Date  . Ovarian cyst   . Pneumonia     hx x4  . Cancer     precancerous cervix  .  GERD (gastroesophageal reflux disease)   . Headache   . Hiatal hernia   . PONV (postoperative nausea and vomiting) 08/06/11  . Difficult intubation     Medications:  Scheduled:    . cefTRIAXone (ROCEPHIN)  IV  1 g Intravenous Q24H  . chlorhexidine  15 mL Mouth/Throat BID  . cloNIDine  0.1 mg Oral BID  . feeding supplement  30 mL Per Tube TID WC  . furosemide  40 mg Per Tube BID  . pantoprazole sodium  40 mg Per Tube Q1200  . potassium chloride  40 mEq Oral Once  . QUEtiapine  200 mg Oral BID  . DISCONTD: clonazePAM  1 mg Oral BID  . DISCONTD: furosemide  40 mg Oral BID  . DISCONTD: furosemide  40 mg Intravenous TID  . DISCONTD: potassium chloride  40 mEq Oral Once  . DISCONTD: QUEtiapine  200 mg Oral TID   Infusions:    . dextrose 20 mL/hr at 08/17/11 0545  . propofol 30 mcg/kg/min (08/17/11 1657)  . DISCONTD: feeding supplement (OXEPA) 1,000 mL (08/17/11 0600)  . DISCONTD:  fentaNYL infusion INTRAVENOUS Stopped (08/17/11 0900)   Assessment: 55 yr old female on Rocephin 1gm IV q24h for Klebsiella PNA.  IV Vancomycin to begin for line sepsis.  CrCl (n) =   Goal of Therapy:  Vancomycin trough level 15-20 mcg/ml  Plan:  1.  Vancomycin 1000mg  IV q8h 2.  Check vancomycin trough level as appropriate.  [name], [name] 08/17/2011,4:59 PM

## 2011-08-17 NOTE — Progress Notes (Signed)
PT SEEN/EXAMINED LEFT ARM EXAMINED SUTURE LINE LOOKS GOOD SUTURES IN PLACE SPLINT RE-APPLIED PT TOLERATED WILL CONTINUE TO FOLLOW [phone] PAGER IF ANY QUESTIONS REGARDING LEFT ARM

## 2011-08-17 NOTE — Progress Notes (Signed)
eLink Physician-Brief Progress Note Patient Name: [name] DOB: [birth year] MRN: [id number]  Date of Service  08/17/2011   HPI/Events of Note  hypokalemia   eICU Interventions  Potassium replaced   Intervention Category Intermediate Interventions: Electrolyte abnormality - evaluation and management Minor Interventions: Electrolytes abnormality - evaluation and management  Davine Coba 08/17/2011, 6:47 AM

## 2011-08-17 NOTE — Progress Notes (Signed)
ANTIBIOTIC CONSULT NOTE - INITIAL  Pharmacy Consult for Primaxin Indication: VAP  Allergies  Allergen Reactions  . Meperidine Hcl Itching    REACTION: hives, itich    Patient Measurements: Height:  (5'4") Weight: 198 lb 6.6 oz (90 kg) IBW/kg (Calculated) : 47.8    Vital Signs: Temp: 102 F (38.9 C) (12/05 2000) Temp src: Core (Comment) (12/05 2000) BP: 117/49 mmHg (12/05 2159) Pulse Rate: 110  (12/05 2000) Intake/Output from previous day: 12/04 0701 - 12/05 0700 In: 1102 [I.V.:588; NG/GT:460; IV Piggyback:54] Out: 2400 [Urine:2400] Intake/Output from this shift: Total I/O In: 60 [I.V.:60] Out: 475 [Urine:475]  Labs:  [facility] 08/17/11 0545 08/16/11 0400 08/15/11 0400  WBC 10.2 7.7 8.6  HGB 9.2* 8.7* 8.4*  PLT 371 361 321  LABCREA -- -- --  CREATININE 0.73 0.75 0.75   Estimated Creatinine Clearance: 83.1 ml/min (by C-G formula based on Cr of 0.73). No results found for this basename: VANCOTROUGH:2,VANCOPEAK:2,VANCORANDOM:2,GENTTROUGH:2,GENTPEAK:2,GENTRANDOM:2,TOBRATROUGH:2,TOBRAPEAK:2,TOBRARND:2,AMIKACINPEAK:2,AMIKACINTROU:2,AMIKACIN:2, in the last 72 hours   Microbiology: Recent Results (from the past 720 hour(s))  SURGICAL PCR SCREEN     Status: Normal   Collection Time   08/05/11  1:51 PM      Component Value Range Status Comment   MRSA, PCR NEGATIVE  NEGATIVE  Final    Staphylococcus aureus NEGATIVE  NEGATIVE  Final   MRSA PCR SCREENING     Status: Normal   Collection Time   08/08/11  2:23 AM      Component Value Range Status Comment   MRSA by PCR NEGATIVE  NEGATIVE  Final   CULTURE, BLOOD (ROUTINE X 2)     Status: Normal   Collection Time   08/08/11  3:15 AM      Component Value Range Status Comment   Specimen Description BLOOD RIGHT ANTECUBITAL   Final    Special Requests BOTTLES DRAWN AEROBIC AND ANAEROBIC 5 CC EA   Final    Setup Time [id number]   Final    Culture NO GROWTH 5 DAYS   Final    Report Status 08/14/2011 FINAL   Final   CULTURE,  BLOOD (ROUTINE X 2)     Status: Normal   Collection Time   08/08/11  3:20 AM      Component Value Range Status Comment   Specimen Description BLOOD RIGHT HAND   Final    Special Requests BOTTLES DRAWN AEROBIC AND ANAEROBIC 5 CC EA   Final    Setup Time [id number]   Final    Culture NO GROWTH 5 DAYS   Final    Report Status 08/14/2011 FINAL   Final   CULTURE, RESPIRATORY     Status: Normal   Collection Time   08/08/11  4:39 AM      Component Value Range Status Comment   Specimen Description ENDOTRACHEAL   Final    Special Requests NONE   Final    Gram Stain     Final    Value: MODERATE WBC PRESENT,BOTH PMN AND MONONUCLEAR     NO SQUAMOUS EPITHELIAL CELLS SEEN     RARE GRAM POSITIVE RODS   Culture MODERATE KLEBSIELLA OXYTOCA   Final    Report Status 08/10/2011 FINAL   Final    Organism ID, Bacteria KLEBSIELLA OXYTOCA   Final     Medical History: Past Medical History  Diagnosis Date  . Ovarian cyst   . Pneumonia     hx x4  . Cancer     precancerous cervix  . GERD (gastroesophageal  reflux disease)   . Headache   . Hiatal hernia   . PONV (postoperative nausea and vomiting) 08/06/11  . Difficult intubation     Medications:  Scheduled:    . acetylcysteine  1 mL Nebulization Q6H  . chlorhexidine  15 mL Mouth/Throat BID  . cloNIDine  0.1 mg Oral BID  . feeding supplement  30 mL Per Tube TID WC  . furosemide  40 mg Per Tube BID  . pantoprazole sodium  40 mg Per Tube Q1200  . potassium chloride  40 mEq Oral Once  . QUEtiapine  200 mg Oral BID  . vancomycin  1,000 mg Intravenous Q8H  . DISCONTD: acetylcysteine  2 mL Nebulization Q6H  . DISCONTD: cefTRIAXone (ROCEPHIN)  IV  1 g Intravenous Q24H  . DISCONTD: clonazePAM  1 mg Oral BID  . DISCONTD: furosemide  40 mg Oral BID  . DISCONTD: furosemide  40 mg Intravenous TID  . DISCONTD: potassium chloride  40 mEq Oral Once  . DISCONTD: QUEtiapine  200 mg Oral TID   Infusions:    . sodium chloride 10 mL/hr at 08/17/11 2200    . dextrose 20 mL/hr at 08/17/11 0545  . propofol 35 mcg/kg/min (08/17/11 2200)  . DISCONTD: feeding supplement (OXEPA) 1,000 mL (08/17/11 0600)  . DISCONTD: fentaNYL infusion INTRAVENOUS Stopped (08/17/11 0900)   Assessment: 55 yo now with possible ventilator associated PNA.  Goal of Therapy:  Treat infetion  Plan:  Primaxin 500 mg IV q6h. F/U SCr as needed.  [name] R 08/17/2011,10:18 PM

## 2011-08-17 NOTE — Progress Notes (Signed)
eLink Physician-Brief Progress Note Patient Name: [name] DOB: June 29, 1956 MRN: [id number]  Date of Service  08/17/2011   HPI/Events of Note   Temp 103.6 - this is highest and rising compared to baselien of 100F prior days. RN says ET tube secretions have changed color to tan yellow and increased  Volume compared to 24h ago  Suspect VAP  eICU Interventions  1. Tylenol for fever 2. BAL culture 3. Check sepsis biomarkers 4. Broaden antibiotics   Intervention Category Intermediate Interventions: Communication with other healthcare providers and/or family;Infection - evaluation and management  Kristian Mogg 08/17/2011, 9:55 PM

## 2011-08-18 ENCOUNTER — Inpatient Hospital Stay (HOSPITAL_COMMUNITY): Payer: No Typology Code available for payment source

## 2011-08-18 ENCOUNTER — Encounter (HOSPITAL_COMMUNITY): Payer: Self-pay | Admitting: Certified Registered Nurse Anesthetist

## 2011-08-18 DIAGNOSIS — G9340 Encephalopathy, unspecified: Secondary | ICD-10-CM

## 2011-08-18 DIAGNOSIS — R061 Stridor: Secondary | ICD-10-CM | POA: Diagnosis not present

## 2011-08-18 DIAGNOSIS — J690 Pneumonitis due to inhalation of food and vomit: Secondary | ICD-10-CM

## 2011-08-18 DIAGNOSIS — J9600 Acute respiratory failure, unspecified whether with hypoxia or hypercapnia: Secondary | ICD-10-CM

## 2011-08-18 DIAGNOSIS — Z9911 Dependence on respirator [ventilator] status: Secondary | ICD-10-CM

## 2011-08-18 LAB — CBC
MCH: 23.9 pg — ABNORMAL LOW (ref 26.0–34.0)
MCHC: 31.2 g/dL (ref 30.0–36.0)
MCV: 76.8 fL — ABNORMAL LOW (ref 78.0–100.0)
Platelets: 383 10*3/uL (ref 150–400)
RDW: 19.4 % — ABNORMAL HIGH (ref 11.5–15.5)

## 2011-08-18 LAB — DIFFERENTIAL
Basophils Absolute: 0 10*3/uL (ref 0.0–0.1)
Basophils Relative: 0 % (ref 0–1)
Eosinophils Absolute: 0.2 10*3/uL (ref 0.0–0.7)
Monocytes Absolute: 1 10*3/uL (ref 0.1–1.0)
Neutrophils Relative %: 81 % — ABNORMAL HIGH (ref 43–77)

## 2011-08-18 LAB — BASIC METABOLIC PANEL
BUN: 22 mg/dL (ref 6–23)
CO2: 37 mEq/L — ABNORMAL HIGH (ref 19–32)
Calcium: 8.9 mg/dL (ref 8.4–10.5)
Creatinine, Ser: 0.86 mg/dL (ref 0.50–1.10)
GFR calc non Af Amer: 76 mL/min — ABNORMAL LOW (ref >90)
Glucose, Bld: 138 mg/dL — ABNORMAL HIGH (ref 70–99)

## 2011-08-18 LAB — VANCOMYCIN, TROUGH: Vancomycin Tr: 19.4 ug/mL (ref 10.0–20.0)

## 2011-08-18 LAB — PHOSPHORUS: Phosphorus: 3.8 mg/dL (ref 2.3–4.6)

## 2011-08-18 LAB — LACTIC ACID, PLASMA: Lactic Acid, Venous: 1.1 mmol/L (ref 0.5–2.2)

## 2011-08-18 LAB — PROCALCITONIN: Procalcitonin: 0.53 ng/mL

## 2011-08-18 LAB — MAGNESIUM: Magnesium: 2.5 mg/dL (ref 1.5–2.5)

## 2011-08-18 MED ORDER — SODIUM CHLORIDE 0.9 % IJ SOLN
10.0000 mL | Freq: Two times a day (BID) | INTRAMUSCULAR | Status: DC
  Administered 2011-08-18 – 2011-08-25 (×14): 10 mL

## 2011-08-18 MED ORDER — FENTANYL CITRATE 0.05 MG/ML IJ SOLN
100.0000 ug | Freq: Once | INTRAMUSCULAR | Status: AC
  Administered 2011-08-18: 16:00:00 via INTRAVENOUS

## 2011-08-18 MED ORDER — LIDOCAINE HCL (CARDIAC) 20 MG/ML IV SOLN
INTRAVENOUS | Status: AC
  Filled 2011-08-18: qty 5

## 2011-08-18 MED ORDER — SUCCINYLCHOLINE CHLORIDE 20 MG/ML IJ SOLN
INTRAMUSCULAR | Status: AC
  Administered 2011-08-18: 50 mg
  Filled 2011-08-18: qty 10

## 2011-08-18 MED ORDER — ROCURONIUM BROMIDE 50 MG/5ML IV SOLN
INTRAVENOUS | Status: AC
  Filled 2011-08-18: qty 2

## 2011-08-18 MED ORDER — ETOMIDATE 2 MG/ML IV SOLN
INTRAVENOUS | Status: AC
  Administered 2011-08-18: 20 mg
  Administered 2011-08-18: 10 mg
  Filled 2011-08-18: qty 20

## 2011-08-18 MED ORDER — IOHEXOL 300 MG/ML  SOLN
100.0000 mL | Freq: Once | INTRAMUSCULAR | Status: AC | PRN
  Administered 2011-08-18: 100 mL via INTRAVENOUS

## 2011-08-18 MED ORDER — FENTANYL CITRATE 0.05 MG/ML IJ SOLN
INTRAMUSCULAR | Status: AC
  Filled 2011-08-18: qty 2

## 2011-08-18 MED ORDER — SODIUM CHLORIDE 0.9 % IJ SOLN
10.0000 mL | INTRAMUSCULAR | Status: DC | PRN
  Administered 2011-08-23 – 2011-08-31 (×6): 10 mL

## 2011-08-18 NOTE — Anesthesia Preprocedure Evaluation (Addendum)
Anesthesia Evaluation  Patient identified by MRN, date of birth, ID band  Reviewed: Allergy & Precautions, H&P , Patient's Chart, lab work & pertinent test results, Unable to perform ROS - Chart review only  History of Anesthesia Complications (+) PONV, DIFFICULT AIRWAY and AWARENESS UNDER ANESTHESIA  Airway       Dental   Pulmonary pneumonia ,  History of aspiration pneumonitis with ARDS on admission;failed extubation X 2          Cardiovascular neg cardio ROS     Neuro/Psych  Headaches, PSYCHIATRIC DISORDERS Altered mental status, encephalopathy on admission    GI/Hepatic Neg liver ROS, hiatal hernia, GERD-  ,  Endo/Other  Negative Endocrine ROS  Renal/GU negative Renal ROSHypokalemia 2.6  Genitourinary negative   Musculoskeletal negative musculoskeletal ROS (+)   Abdominal   Peds  Hematology Anemia. hgb 8.5   Anesthesia Other Findings   Reproductive/Obstetrics negative OB ROS                      Anesthesia Physical Anesthesia Plan  ASA: IV  Anesthesia Plan: General   Post-op Pain Management:    Induction:   Airway Management Planned: Oral ETT and Tracheostomy  Additional Equipment:   Intra-op Plan:   Post-operative Plan: Post-operative intubation/ventilation  Informed Consent: I have reviewed the patients History and Physical, chart, labs and discussed the procedure including the risks, benefits and alternatives for the proposed anesthesia with the patient or authorized representative who has indicated his/her understanding and acceptance.   History available from chart only and Dental advisory given  Plan Discussed with:   Anesthesia Plan Comments:        Anesthesia Quick Evaluation

## 2011-08-18 NOTE — Consult Note (Signed)
Reason for Consult: Repeated failed extubation. Referring Physician: Dr. Lelon Huh is an 55 y.o. female.  HPI: Underwent elbow surgery about a week ago.  Was found down in her home in vomit the day after discharge.  Was given Narcan and mental status improved a bit but was still hypoxic.  Transported to ER where she was electively intubated.  Been managed in ICU since then with aspiration pneumonia and ARDS.  Self-extubated on 11/28 and had to be reintubated immediately due to respiratory distressed.  Was weaned thereafter and extubated electively today and she had increased work of breathing and what sounded like stridor.  Was reintubated within 30-45 minutes.  A neck CT was performed and Dr. Vassie Loll performed bronchoscopy.  The CT scan suggested tracheal stenosis but Dr. Vassie Loll did not see what he would consider stenosis on bronchoscopy with ET tube in place.  Past Medical History  Diagnosis Date  . Ovarian cyst   . Pneumonia     hx x4  . Cancer     precancerous cervix  . GERD (gastroesophageal reflux disease)   . Headache   . Hiatal hernia   . PONV (postoperative nausea and vomiting) 08/06/11  . Difficult intubation     Past Surgical History  Procedure Date  . Cesarean section 87  . Knee surgery 14    rt arthroscopy  . Ovarian cyst removal 80  . Abdominal hysterectomy 88  . Orif elbow fracture 08/06/2011    Procedure: OPEN REDUCTION INTERNAL FIXATION (ORIF) ELBOW/OLECRANON FRACTURE;  Surgeon: Sharma Covert;  Location: [facility] OR;  Service: Orthopedics;  Laterality: Left;    History reviewed. No pertinent family history.  Social History:  reports that she has never smoked. She does not have any smokeless tobacco history on file. She reports that she drinks alcohol. She reports that she does not use illicit drugs.  Allergies:  Allergies  Allergen Reactions  . Meperidine Hcl Itching    REACTION: hives, itich    Medications: I have reviewed the patient's current  medications.  Results for orders placed during the hospital encounter of 08/07/11 (from the past 48 hour(s))  BASIC METABOLIC PANEL     Status: Abnormal   Collection Time   08/17/11  5:45 AM      Component Value Range Comment   Sodium 141  135 - 145 (mEq/L)    Potassium 3.1 (*) 3.5 - 5.1 (mEq/L)    Chloride 95 (*) 96 - 112 (mEq/L)    CO2 39 (*) 19 - 32 (mEq/L)    Glucose, Bld 120 (*) 70 - 99 (mg/dL)    BUN 22  6 - 23 (mg/dL)    Creatinine, Ser 1.61  0.50 - 1.10 (mg/dL)    Calcium 9.1  8.4 - 10.5 (mg/dL)    GFR calc non Af Amer >90  >90 (mL/min)    GFR calc Af Amer >90  >90 (mL/min)   MAGNESIUM     Status: Normal   Collection Time   08/17/11  5:45 AM      Component Value Range Comment   Magnesium 2.3  1.5 - 2.5 (mg/dL)   CBC     Status: Abnormal   Collection Time   08/17/11  5:45 AM      Component Value Range Comment   WBC 10.2  4.0 - 10.5 (K/uL)    RBC 3.97  3.87 - 5.11 (MIL/uL)    Hemoglobin 9.2 (*) 12.0 - 15.0 (g/dL)    HCT 09.6 (*)  36.0 - 46.0 (%)    MCV 78.1  78.0 - 100.0 (fL)    MCH 23.2 (*) 26.0 - 34.0 (pg)    MCHC 29.7 (*) 30.0 - 36.0 (g/dL)    RDW 91.4 (*) 78.2 - 15.5 (%)    Platelets 371  150 - 400 (K/uL)   PHOSPHORUS     Status: Normal   Collection Time   08/17/11  5:45 AM      Component Value Range Comment   Phosphorus 3.5  2.3 - 4.6 (mg/dL)   BLOOD GAS, ARTERIAL     Status: Abnormal   Collection Time   08/17/11  9:25 AM      Component Value Range Comment   FIO2 .40      Delivery systems VENTILATOR      Mode CONTINUOUS POSITIVE AIRWAY PRESSURE      Peep/cpap 5.0      Pressure support 5      pH, Arterial 7.459 (*) 7.350 - 7.400     pCO2 arterial 54.9 (*) 35.0 - 45.0 (mmHg)    pO2, Arterial 51.0 (*) 80.0 - 100.0 (mmHg)    Bicarbonate 38.4 (*) 20.0 - 24.0 (mEq/L)    TCO2 35.6  0 - 100 (mmol/L)    Acid-Base Excess 13.0 (*) 0.0 - 2.0 (mmol/L)    O2 Saturation 85.8      Patient temperature 98.6      Collection site RIGHT RADIAL      Drawn by [id number]       Sample type ARTERIAL DRAW      Allens test (pass/fail) PASS  PASS    CULTURE, BAL-QUANTITATIVE     Status: Normal (Preliminary result)   Collection Time   08/17/11 11:30 PM      Component Value Range Comment   Specimen Description BRONCHIAL ALVEOLAR LAVAGE      Special Requests NONE      Gram Stain        Value: FEW WBC PRESENT,BOTH PMN AND MONONUCLEAR     NO SQUAMOUS EPITHELIAL CELLS SEEN     NO ORGANISMS SEEN   Colony Count PENDING      Culture PENDING      Report Status PENDING     BASIC METABOLIC PANEL     Status: Abnormal   Collection Time   08/18/11  3:30 AM      Component Value Range Comment   Sodium 137  135 - 145 (mEq/L)    Potassium 3.5  3.5 - 5.1 (mEq/L)    Chloride 94 (*) 96 - 112 (mEq/L)    CO2 37 (*) 19 - 32 (mEq/L)    Glucose, Bld 138 (*) 70 - 99 (mg/dL)    BUN 22  6 - 23 (mg/dL)    Creatinine, Ser 0.86  0.50 - 1.10 (mg/dL)    Calcium 8.9  8.4 - 10.5 (mg/dL)    GFR calc non Af Amer 76 (*) >90 (mL/min)    GFR calc Af Amer 88 (*) >90 (mL/min)   CBC     Status: Abnormal   Collection Time   08/18/11  3:30 AM      Component Value Range Comment   WBC 20.5 (*) 4.0 - 10.5 (K/uL)    RBC 4.01  3.87 - 5.11 (MIL/uL)    Hemoglobin 9.6 (*) 12.0 - 15.0 (g/dL)    HCT 57.8 (*) 46.9 - 46.0 (%)    MCV 76.8 (*) 78.0 - 100.0 (fL)    MCH 23.9 (*) 26.0 -  34.0 (pg)    MCHC 31.2  30.0 - 36.0 (g/dL)    RDW 16.1 (*) 09.6 - 15.5 (%)    Platelets 383  150 - 400 (K/uL)   LACTIC ACID, PLASMA     Status: Normal   Collection Time   08/18/11  3:30 AM      Component Value Range Comment   Lactic Acid, Venous 1.1  0.5 - 2.2 (mmol/L)   MAGNESIUM     Status: Normal   Collection Time   08/18/11  3:30 AM      Component Value Range Comment   Magnesium 2.5  1.5 - 2.5 (mg/dL)   PHOSPHORUS     Status: Normal   Collection Time   08/18/11  3:30 AM      Component Value Range Comment   Phosphorus 3.8  2.3 - 4.6 (mg/dL)   PROCALCITONIN     Status: Normal   Collection Time   08/18/11  3:30 AM       Component Value Range Comment   Procalcitonin 0.53     DIFFERENTIAL     Status: Abnormal   Collection Time   08/18/11  3:30 AM      Component Value Range Comment   Neutrophils Relative 81 (*) 43 - 77 (%)    Lymphocytes Relative 13  12 - 46 (%)    Monocytes Relative 5  3 - 12 (%)    Eosinophils Relative 1  0 - 5 (%)    Basophils Relative 0  0 - 1 (%)    Neutro Abs 16.6 (*) 1.7 - 7.7 (K/uL)    Lymphs Abs 2.7  0.7 - 4.0 (K/uL)    Monocytes Absolute 1.0  0.1 - 1.0 (K/uL)    Eosinophils Absolute 0.2  0.0 - 0.7 (K/uL)    Basophils Absolute 0.0  0.0 - 0.1 (K/uL)    RBC Morphology POLYCHROMASIA PRESENT   TARGET CELLS   WBC Morphology TOXIC GRANULATION       Ct Soft Tissue Neck W Contrast  08/18/2011  *RADIOLOGY REPORT*  Clinical Data: Tracheal stenosis.  Evaluate airway.  CT NECK WITH CONTRAST  Technique:  Multidetector CT imaging of the neck was performed with intravenous contrast.  Contrast: OMNIPAQUE IOHEXOL 300 MG/ML IV SOLN  Comparison: No comparison neck CT.  Comparison chest CT 08/08/2011.  Findings: The present examination extend to the carina.  The patient is intubated with the tip 1.1 cm above the carina. Buckling/narrowing of the trachea originating 4 cm above the carina and extending over 2.2 cm in length.  Small bilateral pleural effusions greater left with adjacent passive atelectasis/infiltrate.  Opacification right mastoid air cells.  Opacification with air fluid levels sphenoid sinus air cells bilaterally.  Prominent pooling of secretions within the pharynx.  Nasogastric tube is in place seen to the level of the mid esophagus.  Scoliosis and degenerative changes cervical spine.  Indistinct borders of the piriform sinus and glottic region possibly caused by intubation.  No primary neck mass identified.  Scattered normal to top normal sized lymph nodes throughout the neck.  Largest lymph node left level II region measures 1.5 x 0.8 cm maximal transverse dimension and spanning over  2.2 cm in length.  IMPRESSION: Tracheal narrowing as discussed above.  Endotracheal tube tip is 1.1 cm above the carina.  Indistinct borders of the piriform sinus and glottic region possibly caused by intubation.  No primary neck mass identified.  Small bilateral pleural effusions greater left with adjacent passive atelectasis/infiltrate.  Opacification right mastoid air cells.  Opacification with air fluid levels sphenoid sinus air cells bilaterally.  Scattered normal to top normal sized lymph nodes throughout the neck.  Largest lymph node left level II region measures 1.5 x 0.8 cm maximal transverse dimension and spanning over 2.2 cm in length.  Original Report Authenticated By: Fuller Canada, M.D.   Dg Chest Port 1 View  08/18/2011  *RADIOLOGY REPORT*  Clinical Data: Endotracheal tube placement  PORTABLE CHEST - 1 VIEW  Comparison: 08/18/2011  Findings: Endotracheal tube in good position.  Improved aeration in the lung bases compared with the prior study.  There remains mild bibasilar atelectasis.  NG tube is in place with the tip not visualized.  IMPRESSION: Endotracheal tube in good position.  Improved aeration in the lung bases.  Original Report Authenticated By: Camelia Phenes, M.D.   Dg Chest Port 1 View  08/18/2011  *RADIOLOGY REPORT*  Clinical Data: Respiratory failure  PORTABLE CHEST - 1 VIEW  Comparison: Portable exam 0540 hours compared to 08/17/2011  Findings: Tip of endotracheal tube 3.4 cm above carina. Nasogastric tube extends into stomach. Rotated to the right. Upper normal heart size. Bilateral perihilar infiltrates, slightly improved. Respiratory motion artifact at left lung base. No definite pneumothorax.  IMPRESSION: Bilateral pulmonary infiltrates, slightly improved.  Original Report Authenticated By: Lollie Marrow, M.D.   Dg Chest Port 1 View  08/17/2011  *RADIOLOGY REPORT*  Clinical Data: Respiratory distress.  PORTABLE CHEST - 1 VIEW  Comparison: 08/16/2011.  Findings: Rotation  to the left.  Right central line tip projects at the level of the proximal superior vena cava. Nasogastric tube courses below the diaphragm.  The tip is not included on this exam.  Nasogastric tube tip 4.1 cm above the carina.  Asymmetric air space disease greater on the right may represent pulmonary edema although infectious infiltrate not excluded in the proper clinical setting.  Left base atelectasis/infiltrate. Elevation left hemidiaphragm.  No gross pneumothorax.  IMPRESSION: Taking into account differences in technique with rotation of the patient towards left, air space disease appears relatively similar.  Endotracheal tube has been retracted now 4.1 cm above the carina.  Original Report Authenticated By: Fuller Canada, M.D.    Review of Systems  Unable to perform ROS: intubated   Blood pressure 109/61, pulse 98, temperature 100.8 F (38.2 C), temperature source Core (Comment), resp. rate 22, height 5\' 1"  (1.549 m), weight 89.2 kg (196 lb 10.4 oz), SpO2 100.00%. Physical Exam  Constitutional: She appears well-developed and well-nourished.       Sedated. Intubated.  HENT:  Head: Normocephalic and atraumatic.  Right Ear: External ear normal.  Left Ear: External ear normal.  Nose: Nose normal.  Mouth/Throat: Oropharyngeal exudate: Oral exam limited.  Eyes: Conjunctivae are normal.  Neck:       No mass.  No scars on neck.  Cardiovascular: Normal rate and regular rhythm.   Respiratory:       Mechanical ventilation.  GI: Soft.  Genitourinary:       Did not examine.  Neurological: Cranial nerve deficit: Unable to assess.  Skin: Skin is warm and dry.  Psychiatric:       Unable to assess.    Assessment/Plan: 55 year old with aspiration pneumonia/ARDS who has failed extubation twice with questionable stridor. I personally reviewed the neck CT showing the ET tube in place and no mass in the neck or upper chest.  There may be some narrowing of the trachea near the  tip of the ET tube,  difficult to determine.  I discussed the case with Dr. Vassie Loll.  We agreed that tracheostomy was appropriate at this point to help her get off ventilator and safely manage her airway.  A direct laryngoscopy with tracheoscopy will also be performed.  I discussed the situation with her daughter, Annice Pih, including risks, benefits, and alternatives, and consent was given to proceed.  Aryelle Figg D 08/18/2011, 5:10 PM

## 2011-08-18 NOTE — Progress Notes (Signed)
Bedside Bronchoscopy Procedure Note [name] [id number] 05/21/1956  Procedure: Bronchoscopy Indications: Diagnostic evaluation of the airways  Procedure Details: ET Tube Size: ET Tube secured at lip (cm): Bite block in place: Yes In preparation for procedure, Patient hyper-oxygenated with 100 % FiO2 Airway entered and the following bronchi were examined: Bronchi.   Bronchoscope removed.    Evaluation BP 109/61  Pulse 98  Temp(Src) 100.8 F (38.2 C) (Core (Comment))  Resp 22  Ht 5\' 1"  (1.549 m)  Wt 196 lb 10.4 oz (89.2 kg)  BMI 37.16 kg/m2  SpO2 100% Breath Sounds:Rhonch O2 sats: stable throughout Patient's Current Condition: stable Specimens:  Sent purulent fluid Complications: No apparent complications Patient did tolerate procedure well.   Laurene Footman L 08/18/2011, 4:05 PM

## 2011-08-18 NOTE — Procedures (Signed)
Bronchoscopy Procedure Note [name] [id number] [birth year]  Procedure: Bronchoscopy Indications: Diagnostic evaluation of the airways and Obtain specimens for culture and/or other diagnostic studies  Procedure Details Consent: Risks of procedure as well as the alternatives and risks of each were explained to the (patient/caregiver).  Consent for procedure obtained. Time Out: Verified patient identification, verified procedure, site/side was marked, verified correct patient position, special equipment/implants available, medications/allergies/relevent history reviewed, required imaging and test results available.  Performed  In preparation for procedure, patient was given 100% FiO2 and bronchoscope lubricated. Sedation: fentanyl 50 mcg  Airway entered and the following bronchi were examined: Bronchi.  ETT tip visualised 4 cm above carina. No narrowing of trachea was noted but posterior wall of trachea was noted to cave in with breathing. Procedures performed: Brushings performed Patient placed back on 100% FiO2 at conclusion of procedure.    Evaluation Hemodynamic Status: BP stable throughout; O2 sats: stable throughout Patient's Current Condition: stable Specimens:  Sent purulent fluid Complications: No apparent complications Patient did tolerate procedure well.   ALVA,RAKESH V. 08/18/2011

## 2011-08-18 NOTE — Progress Notes (Signed)
Extubated at 0845 - Awake & following commands with acceptable WOB. Developed insp & expiratory stridor with increased WOB not responding to racemic epinephrine. BiPPA not used due to high aspiration risk. Decision made to reintubate. Airway could not be obtained by me x 2 attempts. Given propofol, succinyl choline 50 mg x1 & etomidate 30 mg. Airway obtained by anesthesia x 1 attempt with succinyl choline & propofol. Some aspiration noted prior to intubation attempts. Daughter updated of situation. Will keep sedated & obtain Tracheostomy- ENT called. Obtain CT neck for tracheal stenosis.   ALVA,RAKESH V.

## 2011-08-18 NOTE — Progress Notes (Signed)
Pt extubated and reintubated by CRNA shortly after due to patients inability to maintain her airway. Patient was placed back on her charted vent settings and is tolerating this well at this time.

## 2011-08-18 NOTE — Plan of Care (Signed)
Problem: Phase I Progression Outcomes Goal: Tracheostomy by Vent Day 14 Outcome: Progressing Planning for a trach 08/19/11 due to failed extubation and respiratory distress.

## 2011-08-18 NOTE — Progress Notes (Signed)
ANTIBIOTIC CONSULT NOTE - INITIAL  Pharmacy Consult for Vancomycin Indication: Line sepsis  Allergies  Allergen Reactions  . Meperidine Hcl Itching    REACTION: hives, itich    Patient Measurements: Height:  (5'4") Weight: 196 lb 10.4 oz (89.2 kg) IBW/kg (Calculated) : 47.8     Vital Signs: Temp: 100.8 F (38.2 C) (12/06 1300) Temp src: Core (Comment) (12/06 1200) BP: 109/61 mmHg (12/06 1400) Pulse Rate: 98  (12/06 1400) Intake/Output from previous day: 12/05 0701 - 12/06 0700 In: 1220 [I.V.:600; NG/GT:20; IV Piggyback:600] Out: 2100 [Urine:2100] Intake/Output from this shift: Total I/O In: 330 [I.V.:30; IV Piggyback:300] Out: 410 [Urine:410]  Labs:  [facility] 08/18/11 0330 08/17/11 0545 08/16/11 0400  WBC 20.5* 10.2 7.7  HGB 9.6* 9.2* 8.7*  PLT 383 371 361  LABCREA -- -- --  CREATININE 0.86 0.73 0.75   Estimated Creatinine Clearance: 76.9 ml/min (by C-G formula based on Cr of 0.86).  Basename 08/18/11 1700  VANCOTROUGH 19.4  VANCOPEAK --  VANCORANDOM --  GENTTROUGH --  GENTPEAK --  GENTRANDOM --  TOBRATROUGH --  TOBRAPEAK --  TOBRARND --  AMIKACINPEAK --  AMIKACINTROU --  AMIKACIN --     Microbiology: Recent Results (from the past 720 hour(s))  SURGICAL PCR SCREEN     Status: Normal   Collection Time   08/05/11  1:51 PM      Component Value Range Status Comment   MRSA, PCR NEGATIVE  NEGATIVE  Final    Staphylococcus aureus NEGATIVE  NEGATIVE  Final   MRSA PCR SCREENING     Status: Normal   Collection Time   08/08/11  2:23 AM      Component Value Range Status Comment   MRSA by PCR NEGATIVE  NEGATIVE  Final   CULTURE, BLOOD (ROUTINE X 2)     Status: Normal   Collection Time   08/08/11  3:15 AM      Component Value Range Status Comment   Specimen Description BLOOD RIGHT ANTECUBITAL   Final    Special Requests BOTTLES DRAWN AEROBIC AND ANAEROBIC 5 CC EA   Final    Setup Time [id number]   Final    Culture NO GROWTH 5 DAYS   Final    Report  Status 08/14/2011 FINAL   Final   CULTURE, BLOOD (ROUTINE X 2)     Status: Normal   Collection Time   08/08/11  3:20 AM      Component Value Range Status Comment   Specimen Description BLOOD RIGHT HAND   Final    Special Requests BOTTLES DRAWN AEROBIC AND ANAEROBIC 5 CC EA   Final    Setup Time [id number]   Final    Culture NO GROWTH 5 DAYS   Final    Report Status 08/14/2011 FINAL   Final   CULTURE, RESPIRATORY     Status: Normal   Collection Time   08/08/11  4:39 AM      Component Value Range Status Comment   Specimen Description ENDOTRACHEAL   Final    Special Requests NONE   Final    Gram Stain     Final    Value: MODERATE WBC PRESENT,BOTH PMN AND MONONUCLEAR     NO SQUAMOUS EPITHELIAL CELLS SEEN     RARE GRAM POSITIVE RODS   Culture MODERATE KLEBSIELLA OXYTOCA   Final    Report Status 08/10/2011 FINAL   Final    Organism ID, Bacteria KLEBSIELLA OXYTOCA   Final   CULTURE, BAL-QUANTITATIVE  Status: Normal (Preliminary result)   Collection Time   08/17/11 11:30 PM      Component Value Range Status Comment   Specimen Description BRONCHIAL ALVEOLAR LAVAGE   Final    Special Requests NONE   Final    Gram Stain     Final    Value: FEW WBC PRESENT,BOTH PMN AND MONONUCLEAR     NO SQUAMOUS EPITHELIAL CELLS SEEN     NO ORGANISMS SEEN   Colony Count PENDING   Incomplete    Culture PENDING   Incomplete    Report Status PENDING   Incomplete     Medical History: Past Medical History  Diagnosis Date  . Ovarian cyst   . Pneumonia     hx x4  . Cancer     precancerous cervix  . GERD (gastroesophageal reflux disease)   . Headache   . Hiatal hernia   . PONV (postoperative nausea and vomiting) 08/06/11  . Difficult intubation     Medications:  Scheduled:     . chlorhexidine  15 mL Mouth/Throat BID  . cloNIDine  0.1 mg Oral BID  . etomidate      . feeding supplement  30 mL Per Tube TID WC  . fentaNYL  100 mcg Intravenous Once  . furosemide  40 mg Per Tube BID  .  imipenem-cilastatin  500 mg Intravenous Q6H  . lidocaine (cardiac) 100 mg/40ml      . pantoprazole sodium  40 mg Per Tube Q1200  . QUEtiapine  200 mg Oral BID  . rocuronium      . succinylcholine      . vancomycin  1,000 mg Intravenous Q8H  . DISCONTD: acetylcysteine  2 mL Nebulization Q6H  . DISCONTD: acetylcysteine  1 mL Nebulization Q6H  . DISCONTD: cefTRIAXone (ROCEPHIN)  IV  1 g Intravenous Q24H   Infusions:     . sodium chloride 10 mL/hr at 08/18/11 0800  . dextrose 20 mL/hr at 08/18/11 0800  . propofol 15 mcg/kg/min (08/18/11 1500)   Assessment: Vanc trough in target range. SCr stable.  Goal of Therapy:  Vancomycin trough level 15-20 mcg/ml  Plan:  1.  Continue Vancomycin 1000mg  IV q8h 2.  F/u daily.  Reece Packer 08/18/2011,6:08 PM

## 2011-08-18 NOTE — Progress Notes (Signed)
CSW following for support/possible needs due to lengthy hospitalization. CSW assisted Brett Canales Minor, NP in contacting family re. Pt's status of needing to be reintubated. Elayne Snare, daughter and HCPOA updated. CSW spoke with her at length about how family was coping and how CSW could assist. Daughter appreciative and reports she, fiance and Pt's son all coping adequately currently. CSW will follow Pt and family.   Vennie Homans, [place] 08/18/2011 9:52 AM [phone]

## 2011-08-18 NOTE — Progress Notes (Signed)
Speech Language/Pathology Patient currently being reintubated.  MD gave verbal order to cancel swallow evaluation at this time.  [name]

## 2011-08-18 NOTE — Progress Notes (Signed)
Critical Care Medicine Note  Patient Details:    24 yowf s/p elbow surgery 11/24 > discharged home with pain medications post operatively.Adm 11/25 with aspiration pneumonia  Lines/tubes 11/25 OETT>>>11/28 11/28 (reintubation)>>>12/6 12/6 urgent reintubation stridor cw tracheal stenosis>> 11/26: right IJ CVL>>>12/5  Microbiology/Sepsis markers: 11/26: MRSA : neg 11/26: BCX2>>>neg 11/26 sputum: moderate Klebsiella oxytoca (pan sens, but resistance to Unasyn) 12/5 BAL>>> 12/5 CVL tip Cx>>> 12/6 PCT>>>0.53 12/6 Lactic acid>>>1.1  Anti-infectives:  unasyn 11/26 (Asp)>>>11/28 clinda 11/26 (asp)>>> 11/27 11/28 rocephin (K oxytoca)>>>12/6 12/5 Vanc (line sepsis)>>> 12/5 Primaxin (VAP)>>>  Studies/Events:  11/27  bilat ue doppler neg 12/3 agitated with WUA 12/4 bradys down with weaning attempts, tolerated Ps 5/5 12/5 reattempts weaning>>failed, agitation 12/6 reattempt weaning  Subjective:    Overnight Issues: failed extubation  Objective:  Vital signs for last 24 hours: Temp:  [101.1 F (38.4 C)-104 F (40 C)] 101.5 F (38.6 C) (12/06 0600) Pulse Rate:  [95-117] 99  (12/06 0600) Resp:  [14-33] 24  (12/06 0600) BP: (99-138)/(47-72) 111/59 mmHg (12/06 0600) SpO2:  [88 %-100 %] 99 % (12/06 0600) FiO2 (%):  [40 %] 40 % (12/06 0600) Weight:  [196 lb 10.4 oz (89.2 kg)] 196 lb 10.4 oz (89.2 kg) (12/06 0500)    Intake/Output this shift:  Intake/Output Summary (Last 24 hours) at 08/18/11 0807 Last data filed at 08/18/11 0600  Gross per 24 hour  Intake   1150 ml  Output   1995 ml  Net   -845 ml     Physical Exam:  General:sedated on vent after reintubtion. Neuro: Opens eyes to verbal, moves lower EXT HEENT/Neck: jvd evident Resp: coarse bilat CVS: Tachy, regular, no m/r/g GI: soft, nontender, BS WNL, no r/g Skin: no rash Extremities: Wrapped left arm, L>R LE edema with pitting  PCXR:  12/5: Taking into account differences in technique with rotation of  the patient towards left, air space disease appears relatively similar. Endotracheal tube has been retracted now 4.1 cm above the carina. 12/6 : Pt rotated to right, air space disease improved from 12/5      Labs  Lab 08/18/11 0330 08/17/11 0545 08/16/11 0400  NA 137 141 142  K 3.5 3.1* 3.1*  CL 94* 95* 96  CO2 37* 39* 38*  BUN 22 22 22   CREATININE 0.86 0.73 0.75  GLUCOSE 138* 120* 149*    Lab 08/18/11 0330 08/17/11 0545 08/16/11 0400  HGB 9.6* 9.2* 8.7*  HCT 30.8* 31.0* 29.0*  WBC 20.5* 10.2 7.7  PLT 383 371 361    CT neck reviewed  Assessment/plan: Acute Respiratory failure with ALI and ARDS. S/P c/w aspiration event.She self extubated 11/28, and re-intubated with-in an hour. Her CXR has improved a little from 12/5.    I/O remain neg.  Plan: -Continue bronchodilators - diprivan gtt, till trach -try to keep neg    Aspiration pneumonia/ Line sepsis -+ Klebsiella oxitoca, ( intermediate sensitive to Unasyn). Rocephin started on 11/28.  -Added Vanc/primaxin -Rocephin x 8 days/ dc 12/6 -UCX though suspect line related infection  -repeat BCX, 12/6>>Febrile, increased WBC overnight. CVL pulled 12/5. Presumed line-related infection. Will place PICC.   AMS/encephalopathy, this is persistent. Plan:  - Resume Seroquel 200 tid 12/5 - qT ok/ ct clonazepam (concern for w/d though not clear exactly how much she uses cns drugs at home) -added low dose clonidine 11/30    Hypernatremia  Lab 08/18/11 0330 08/17/11 0545 08/16/11 0400  NA 137 141 142  Plan: - d/c extra  water 11/30 d5w to kvo Lasix 40 to bid, aim for CVP 4 range (once PICC placed) Chem as needed kcl supp as needed  Anemia: no evidence of bleeding  Lab 08/18/11 0330 08/17/11 0545 08/16/11 0400  HGB 9.6* 9.2* 8.7*    Plan: -transfuse for hemoglobin less than 7 -Continue proton pump inhibitor  Elbow surgery - called dr Melvyn Novas to reassess 12/4 .  Malnutrition -Restart TF at 10 cc/hr, escalate      LOS: 11 days   MINOR,WILLIAM Hanley Hays, NP student  Care during the described time interval was provided by me and/or other providers on the critical care team.  I have reviewed this patient's available data, including medical history, events of note, physical examination and test results as part of my evaluation  CC time x 40 m  Mckay Tegtmeyer V.

## 2011-08-18 NOTE — Progress Notes (Addendum)
Nutrition Follow-up  Pt extubated, unable to sustain, and reintubated.  OGT replaced.  No tube feeds started at this time. Plans for trach today at noon  Continues diprivan 8.1 mL/hr providing 213 kcal/day  Diet Order:  NPO  Meds: Scheduled Meds:   . acetylcysteine  1 mL Nebulization Q6H  . chlorhexidine  15 mL Mouth/Throat BID  . cloNIDine  0.1 mg Oral BID  . etomidate      . feeding supplement  30 mL Per Tube TID WC  . furosemide  40 mg Per Tube BID  . imipenem-cilastatin  500 mg Intravenous Q6H  . lidocaine (cardiac) 100 mg/54ml      . pantoprazole sodium  40 mg Per Tube Q1200  . QUEtiapine  200 mg Oral BID  . rocuronium      . succinylcholine      . vancomycin  1,000 mg Intravenous Q8H  . DISCONTD: acetylcysteine  2 mL Nebulization Q6H  . DISCONTD: cefTRIAXone (ROCEPHIN)  IV  1 g Intravenous Q24H  . DISCONTD: clonazePAM  1 mg Oral BID  . DISCONTD: furosemide  40 mg Oral BID  . DISCONTD: furosemide  40 mg Intravenous TID  . DISCONTD: QUEtiapine  200 mg Oral TID   Continuous Infusions:   . sodium chloride 10 mL/hr at 08/18/11 0800  . dextrose 20 mL/hr at 08/18/11 0800  . propofol 15 mcg/kg/min (08/18/11 0600)   PRN Meds:.acetaminophen, albuterol, iohexol, promethazine  Labs:  CMP     Component Value Date/Time   NA 137 08/18/2011 0330   K 3.5 08/18/2011 0330   CL 94* 08/18/2011 0330   CO2 37* 08/18/2011 0330   GLUCOSE 138* 08/18/2011 0330   BUN 22 08/18/2011 0330   CREATININE 0.86 08/18/2011 0330   CALCIUM 8.9 08/18/2011 0330   PROT 6.0 08/11/2011 0349   ALBUMIN 2.4* 08/11/2011 0349   AST 29 08/11/2011 0349   ALT 17 08/11/2011 0349   ALKPHOS 95 08/11/2011 0349   BILITOT 0.5 08/11/2011 0349   GFRNONAA 76* 08/18/2011 0330   GFRAA 88* 08/18/2011 0330   Last trigylercide:  134mg /dL  Intake/Output Summary (Last 24 hours) at 08/18/11 1422 Last data filed at 08/18/11 0800  Gross per 24 hour  Intake   1090 ml  Output   1260 ml  Net   -170 ml    Weight Status:     Admission wt: 206 lbs Current wt: 196 lbs  Nutrition Dx:  Inadequate oral intake ongoing  Intervention:  1.  Enteral nutrition; recommend transition pt to Jevity 1.2 @ 20 mL/hr once trach placed and if MD desires resume of TFs. Advance by 10 mL q 4 hrs to 45 mL/hr goal with Prostat BID to provide 1509 kcal, 89 g protein, 885 mL free water.  Will account for current diprivan.  If large changes in regimen made, please consult RD.  Monitor:  1.  Enteral nutrition; resume of TFs in appropriate after trach placement. 2.  Food/Beverage; as appropriate once pt able to discontinue sedation.  May need SLP evaluation due to long duration on ventilator.   Hoyt Koch Pager #:  [phone] \

## 2011-08-19 ENCOUNTER — Inpatient Hospital Stay (HOSPITAL_COMMUNITY): Payer: No Typology Code available for payment source

## 2011-08-19 ENCOUNTER — Encounter (HOSPITAL_COMMUNITY): Payer: No Typology Code available for payment source

## 2011-08-19 ENCOUNTER — Other Ambulatory Visit (HOSPITAL_COMMUNITY): Payer: No Typology Code available for payment source

## 2011-08-19 ENCOUNTER — Encounter (HOSPITAL_COMMUNITY)
Admission: EM | Disposition: A | Discharge status: Rehabilitation Facility | Payer: Self-pay | Source: Home / Self Care | Attending: Pulmonary Disease

## 2011-08-19 ENCOUNTER — Encounter (HOSPITAL_COMMUNITY): Payer: Self-pay | Admitting: Anesthesiology

## 2011-08-19 ENCOUNTER — Inpatient Hospital Stay (HOSPITAL_COMMUNITY): Payer: No Typology Code available for payment source | Admitting: Anesthesiology

## 2011-08-19 HISTORY — PX: DIRECT LARYNGOSCOPY: SHX5326

## 2011-08-19 HISTORY — PX: TRACHEOSTOMY TUBE PLACEMENT: SHX814

## 2011-08-19 LAB — BASIC METABOLIC PANEL
BUN: 17 mg/dL (ref 6–23)
Chloride: 95 mEq/L — ABNORMAL LOW (ref 96–112)
GFR calc Af Amer: >90 mL/min (ref >90)
Glucose, Bld: 100 mg/dL — ABNORMAL HIGH (ref 70–99)
Potassium: 2.6 mEq/L — CL (ref 3.5–5.1)

## 2011-08-19 LAB — URINE CULTURE
Colony Count: NO GROWTH
Culture: NO GROWTH
Special Requests: NORMAL

## 2011-08-19 LAB — CBC
HCT: 28.5 % — ABNORMAL LOW (ref 36.0–46.0)
Hemoglobin: 8.5 g/dL — ABNORMAL LOW (ref 12.0–15.0)
MCHC: 29.8 g/dL — ABNORMAL LOW (ref 30.0–36.0)

## 2011-08-19 SURGERY — LARYNGOSCOPY, DIRECT
Anesthesia: General | Site: Throat | Wound class: Clean Contaminated

## 2011-08-19 MED ORDER — FENTANYL CITRATE 0.05 MG/ML IJ SOLN
INTRAMUSCULAR | Status: DC | PRN
  Administered 2011-08-19 (×3): 50 ug via INTRAVENOUS

## 2011-08-19 MED ORDER — POTASSIUM CHLORIDE 20 MEQ/15ML (10%) PO LIQD
80.0000 meq | Freq: Once | ORAL | Status: AC
  Administered 2011-08-19: 80 meq
  Filled 2011-08-19: qty 60

## 2011-08-19 MED ORDER — ROCURONIUM BROMIDE 100 MG/10ML IV SOLN
INTRAVENOUS | Status: DC | PRN
  Administered 2011-08-19: 20 mg via INTRAVENOUS
  Administered 2011-08-19: 50 mg via INTRAVENOUS

## 2011-08-19 MED ORDER — BIOTENE DRY MOUTH MT LIQD
15.0000 mL | Freq: Four times a day (QID) | OROMUCOSAL | Status: DC
  Administered 2011-08-19 – 2011-08-31 (×44): 15 mL via OROMUCOSAL

## 2011-08-19 MED ORDER — LACTATED RINGERS IV SOLN
INTRAVENOUS | Status: DC | PRN
  Administered 2011-08-19: 12:00:00 via INTRAVENOUS

## 2011-08-19 MED ORDER — PROPOFOL 10 MG/ML IV EMUL
INTRAVENOUS | Status: DC | PRN
  Administered 2011-08-19: 37 ug/kg/min via INTRAVENOUS

## 2011-08-19 MED ORDER — CHLORHEXIDINE GLUCONATE 0.12 % MT SOLN
15.0000 mL | Freq: Two times a day (BID) | OROMUCOSAL | Status: DC
  Administered 2011-08-19 – 2011-08-30 (×22): 15 mL via OROMUCOSAL
  Filled 2011-08-19 (×25): qty 15

## 2011-08-19 MED ORDER — LIDOCAINE-EPINEPHRINE 1 %-1:100000 IJ SOLN
INTRAMUSCULAR | Status: DC | PRN
  Administered 2011-08-19: 3 mL

## 2011-08-19 SURGICAL SUPPLY — 34 items
ATTRACTOMAT 16X20 MAGNETIC DRP (DRAPES) ×3 IMPLANT
BAG ZIPLOCK 12X15 (MISCELLANEOUS) ×3 IMPLANT
BLADE SURG 15 STRL LF DISP TIS (BLADE) ×2 IMPLANT
BLADE SURG 15 STRL SS (BLADE) ×1
BLADE SURG SZ11 CARB STEEL (BLADE) ×3 IMPLANT
CANISTER SUCTION 2500CC (MISCELLANEOUS) ×3 IMPLANT
CLEANER TIP ELECTROSURG 2X2 (MISCELLANEOUS) ×3 IMPLANT
CLOTH BEACON ORANGE TIMEOUT ST (SAFETY) ×3 IMPLANT
COVER SURGICAL LIGHT HANDLE (MISCELLANEOUS) ×3 IMPLANT
DISSECTOR ROUND CHERRY 3/8 STR (MISCELLANEOUS) ×3 IMPLANT
ELECT COATED BLADE 2.86 ST (ELECTRODE) ×3 IMPLANT
ELECT REM PT RETURN 9FT ADLT (ELECTROSURGICAL) ×3
ELECTRODE REM PT RTRN 9FT ADLT (ELECTROSURGICAL) ×2 IMPLANT
GAUZE SPONGE 4X4 16PLY XRAY LF (GAUZE/BANDAGES/DRESSINGS) IMPLANT
GLOVE BIO SURGEON STRL SZ7.5 (GLOVE) ×3 IMPLANT
HOLDER TRACH TUBE VELCRO 19.5 (MISCELLANEOUS) ×3 IMPLANT
KIT BASIN OR (CUSTOM PROCEDURE TRAY) ×3 IMPLANT
NS IRRIG 1000ML POUR BTL (IV SOLUTION) ×3 IMPLANT
PACK EENT SPLIT (PACKS) ×3 IMPLANT
PENCIL BUTTON HOLSTER BLD 10FT (ELECTRODE) ×3 IMPLANT
SOLUTION ANTI FOG 6CC (MISCELLANEOUS) ×3 IMPLANT
SPONGE DRAIN TRACH 4X4 STRL 2S (GAUZE/BANDAGES/DRESSINGS) ×3 IMPLANT
SPONGE GAUZE 4X4 12PLY (GAUZE/BANDAGES/DRESSINGS) ×3 IMPLANT
SUT SILK 2 0 (SUTURE) ×1
SUT SILK 2 0 30  PSL (SUTURE) ×1
SUT SILK 2 0 30 PSL (SUTURE) ×2 IMPLANT
SUT SILK 2 0 REEL (SUTURE) ×3 IMPLANT
SUT SILK 2 0 SH (SUTURE) ×6 IMPLANT
SUT SILK 2-0 18XBRD TIE 12 (SUTURE) ×2 IMPLANT
SYR 20CC LL (SYRINGE) IMPLANT
SYRINGE 10CC LL (SYRINGE) ×3 IMPLANT
TUBE TRACH SHILEY  6 DIST  CUF (TUBING) ×3 IMPLANT
WATER STERILE IRR 1500ML POUR (IV SOLUTION) ×3 IMPLANT
YANKAUER SUCT BULB TIP 10FT TU (MISCELLANEOUS) ×3 IMPLANT

## 2011-08-19 NOTE — Progress Notes (Signed)
eLink Physician-Brief Progress Note Patient Name: [name] DOB: 09-Nov-1955 MRN: [id number]  Date of Service  08/19/2011   HPI/Events of Note   hypokalemia  eICU Interventions  Potassium replaced   Intervention Category Intermediate Interventions: Electrolyte abnormality - evaluation and management  DETERDING,ELIZABETH 08/19/2011, 5:58 AM

## 2011-08-19 NOTE — Transfer of Care (Signed)
Immediate Anesthesia Transfer of Care Note  Patient: [name]  Procedure(s) Performed:  DIRECT LARYNGOSCOPY; TRACHEOSTOMY - tracheoscopy  Patient Location: PACU and ICU  Anesthesia Type: General  Level of Consciousness: sedated  Airway & Oxygen Therapy: Patient connected to tracheostomy mask oxygen  Post-op Assessment: Report given to PACU RN  Post vital signs: Reviewed and stable  Complications: No apparent anesthesia complications

## 2011-08-19 NOTE — Anesthesia Postprocedure Evaluation (Signed)
  Anesthesia Post-op Note  Patient: [name]  Procedure(s) Performed:  DIRECT LARYNGOSCOPY; TRACHEOSTOMY - tracheoscopy  Patient Location: PACU  Anesthesia Type: General  Level of Consciousness: sedate  Airway and Oxygen Therapy: ventilated. trach  Post-op Pain: unable to assess  Post-op Assessment: Post-op Vital signs reviewed, Patient's Cardiovascular Status Stable, Respiratory Function Stable, Patent Airway and No signs of Nausea or vomiting  Post-op Vital Signs: stable  Complications: No apparent anesthesia complications

## 2011-08-19 NOTE — Progress Notes (Signed)
CSW met with fiance, [name] to offer support and check in. He is anxious about procedure, but is appropriate. CSW inquired if Pt had issues with anxiety in the past. He reports that she did, but mostly depression and was on some medication in the past. This may need to be addressed in order to assist with extubation when appropriate.  CSW to follow for psychosocial issues.  [name], [place] 08/19/2011  11:41 AM [phone]

## 2011-08-19 NOTE — Preoperative (Signed)
Beta Blockers   Reason not to administer Beta Blockers:Not Applicable 

## 2011-08-19 NOTE — Brief Op Note (Signed)
08/07/2011 - 08/19/2011  12:39 PM  PATIENT:  [name]  55 y.o. female  PRE-OPERATIVE DIAGNOSIS:  Tracheal Stenosis, respiratory failure  POST-OPERATIVE DIAGNOSIS:  Tracheal stenosis, respiratory failure  PROCEDURE:  Procedure(s): DIRECT LARYNGOSCOPY TRACHEOSTOMY TRACHEOSCOPY  SURGEON:  Surgeon(s): Antony Contras  PHYSICIAN ASSISTANT:   ASSISTANTS: none   ANESTHESIA:   general  EBL:  Total I/O In: -  Out: 355 [Urine:350; Blood:5]  BLOOD ADMINISTERED:none  DRAINS: none   LOCAL MEDICATIONS USED:  LIDOCAINE 3CC  SPECIMEN:  No Specimen  DISPOSITION OF SPECIMEN:  N/A  COUNTS:  YES  TOURNIQUET:  * No tourniquets in log *  DICTATION: .Other Dictation: Dictation Number [id number]  PLAN OF CARE: Return to ICU  PATIENT DISPOSITION:  ICU - intubated and hemodynamically stable.   Delay start of Pharmacological VTE agent (>24hrs) due to surgical blood loss or risk of bleeding:  {YES/NO/NOT APPLICABLE:20182

## 2011-08-19 NOTE — Progress Notes (Signed)
Critical Care Medicine Note  Patient Details:    55 yowf s/p elbow surgery 11/24 > discharged home with pain medications post operatively. Noted at home to be minimally responsive after sleeping/pain pill combo.  Adm 11/25 with aspiration pneumonia, AMS.   Lines/tubes 11/25 OETT>>>11/28 11/28 (reintubation)>>>12/6 12/6 urgent reintubation stridor cw tracheal stenosis>> 11/26 right IJ CVL>>>12/5 12/6 R PICC>>> 12/6 ENT Trach>>>  Microbiology/Sepsis markers: 11/26 MRSA : neg 11/26 BCX2>>>neg 11/26 sputum: moderate Klebsiella oxytoca (pan sens, but resistance to Unasyn) 12/5 BAL>>>nml flora (prelim) 12/6 BAL>> 12/6 resp culture>>>few GNR>>> 12/5 CVL tip Cx>>>NGTD>> 12/6 PCT>>>0.53 12/6 Lactic acid>>>1.1  Anti-infectives:  unasyn 11/26 (Asp)>>>11/28 clinda 11/26 (asp)>>> 11/27 11/28 rocephin (K oxytoca)>>>12/6 12/5 Vanc (line sepsis)>>> 12/5 Primaxin (VAP)>>>  Studies/Events: 11/27 bilat ue doppler neg 12/3 agitated with WUA 12/4 bradys down with weaning attempts, tolerated Ps 5/5 12/5 reattempts weaning>>failed, agitation 12/6 extubated/reintubated, plan for trach 12/6 CT neck>> tracheal narrowing, small bilateral pleural effusions L>R, bronch 12/7 to OR for ENT Trach  Subjective:    Overnight Issues: failed extubation  Objective:  Vital signs for last 24 hours: Temp:  [99.9 F (37.7 C)-101.7 F (38.7 C)] 101.7 F (38.7 C) (12/07 0800) Pulse Rate:  [90-114] 97  (12/07 0800) Resp:  [18-23] 21  (12/07 0800) BP: (97-146)/(48-71) 142/57 mmHg (12/07 0800) SpO2:  [94 %-100 %] 100 % (12/07 0800) FiO2 (%):  [40 %-60 %] 40 % (12/07 0818) Weight:  [196 lb 3.4 oz (89 kg)-198 lb 6.6 oz (90 kg)] 196 lb 3.4 oz (89 kg) (12/07 0500)    Intake/Output this shift:  Intake/Output Summary (Last 24 hours) at 08/19/11 0946 Last data filed at 08/19/11 0700  Gross per 24 hour  Intake   2040 ml  Output   1670 ml  Net    370 ml     Physical Exam:  General:sedated on vent    Neuro: Opens eyes to verbal, moves lower EXT HEENT/Neck: jvd evident Resp: coarse bilat CVS: Tachy, regular, no m/r/g GI: soft, nontender, BS WNL, no r/g Skin: no rash Extremities: Wrapped left arm, L>R LE edema with pitting  PCXR:  . 12/6 : Pt rotated to right, air space disease improved from 12/5  12/7: Progressive asymmetric air space disease greater on the right may represent pulmonary edema although infectious infiltrate not excluded. No gross pneumothorax. Elevated right hemidiaphragm. Cardiomegaly.   Labs  Lab 08/19/11 0417 08/18/11 0330 08/17/11 0545  NA 138 137 141  K 2.6* 3.5 3.1*  CL 95* 94* 95*  CO2 38* 37* 39*  BUN 17 22 22   CREATININE 0.68 0.86 0.73  GLUCOSE 100* 138* 120*    Lab 08/19/11 0417 08/18/11 0330 08/17/11 0545  HGB 8.5* 9.6* 9.2*  HCT 28.5* 30.8* 31.0*  WBC 11.2* 20.5* 10.2  PLT 391 383 371     Assessment/plan:  Acute Respiratory failure with ALI and ARDS. S/P c/w aspiration event. She self extubated 11/28, and re-intubated with-in an hour. Extubated 12/6, reintubated within an hour, again. Her CXR has improved a little from 12/5.  I/O remain neg. CVP 14. Bedside Trach 12/7 (ENT) Plan: -trach placement 12/7 , expect rapid wean to ATC after -Continue bronchodilators -diprivan gtt, till trach -try to keep neg   -cont Lasix   Aspiration pneumonia/ Line sepsis -+ Klebsiella oxitoca, (intermediate sensitive to Unasyn). Rocephin started on 11/28. CVL pulled 12/5 (Cx). PICC placed 12/6.  -Added Vanc/ primaxin 12/5 -Rocephin x 8 days/ dc 12/6 -follow CXR    AMS/encephalopathy, this  is persistent. Plan:  - Cont Seroquel 200 tid 12/5 - qT ok/  -11/30 added clonadine (concern for w/d though not clear exactly how much she uses cns drugs at home)    Hypernatremia  Lab 08/19/11 0417 08/18/11 0330 08/17/11 0545  NA 138 137 141  Plan: -d/c extra water 11/30 -d5w to kvo -Lasix 40 to bid -Chem as needed  Hypokalemia -replenish 12/7 -f/u  bmp in am  Anemia: no evidence of bleeding  Lab 08/19/11 0417 08/18/11 0330 08/17/11 0545  HGB 8.5* 9.6* 9.2*    Plan: -transfuse for hemoglobin less than 7 -Continue proton pump inhibitor  Elbow surgery - called dr Melvyn Novas to reassess 12/4 .  Malnutrition -TF per nutrition (hold for trach)    LOS: 12 days   Canary Brim, NP-C [facility] Pgr: [phone]   Care during the described time interval was provided by me and/or other providers on the critical care team.  I have reviewed this patient's available data, including medical history, events of note, physical examination and test results as part of my evaluation  CC time x 30 m

## 2011-08-19 NOTE — Progress Notes (Signed)
Discussed in the long length of stay meeting [name] Weeks 08/19/2011  

## 2011-08-19 NOTE — Progress Notes (Signed)
CRITICAL VALUE ALERT  Critical value received:  K+2.6  Date of notification:  08/19/11  Time of notification:  0555  Critical value read back:yes  Nurse who received alert:  M. Kaytlynn Kochan  MD notified (1st page):  Dr. Darrick Penna  Time of first page:  [phone]  MD notified (2nd page):  Time of second page:  Responding MD:    Time MD responded:

## 2011-08-19 NOTE — Op Note (Signed)
[name], [name]         ACCOUNT NO.:  [id number]  MEDICAL RECORD NO.:  [id number]  LOCATION:  1233                         FACILITY:  [facility]  PHYSICIAN:  Antony Contras, MD     DATE OF BIRTH:  August 26, 1956  DATE OF PROCEDURE:  08/19/2011 DATE OF DISCHARGE:                              OPERATIVE REPORT   PREOPERATIVE DIAGNOSES: 1. Respiratory failure. 2. Stridor.  POSTOPERATIVE DIAGNOSES: 1. Respiratory failure. 2. Stridor. 3. Laryngeal edema and inflammation.  PROCEDURE: 1. Tracheostomy. 2. Direct laryngoscopy. 3. Tracheoscopy via tracheostomy tube.  SURGEON:  Antony Contras, MD  ANESTHESIA:  General endotracheal anesthesia.  COMPLICATIONS:  None.  INDICATION:  The patient is a 55 year old white female who had elbow surgery in November for a fracture and unfortunately was found down the day after discharge in vomit and required elective intubation in the emergency department and treatment thereafter for aspiration pneumonia. She self-extubated at one point, but had to be reintubated due to labored breathing and was electively extubated yesterday morning, but had to be reintubated again due to labored breathing and stridor. Tracheostomy was requested as well as an evaluation of the airway. Bronchoscopy via the endotracheal tube did not yield any direct cause of her symptoms.  FINDINGS:  The tracheostomy tube was placed in a standard fashion without any difficulty or abnormal anatomy.  Suctioning down the trachea revealed no narrowing at the tracheostomy site or below.  Upon direct laryngoscopy, the posterior larynx was markedly edematous in the arytenoid region and the vocal folds were markedly inflamed and friable on both sides with edema.  Deep to the vocal folds, the tracheostomy tube was easily seen with no upper tracheal narrowing.  The flexible laryngoscope was passed then through the tracheostomy tube and there was no narrowing in the distal trachea or  primary bronchi and the tube was backed out somewhat and still no narrowing seen.  DESCRIPTION OF PROCEDURE:  The patient was identified in the intensive care unit and informed consent having been obtained from the family including discussion of risks, benefits, alternatives, the patient was brought to the operative suite and put on the operative table in supine position.  The patient was maintained via endotracheal anesthesia.  A shoulder roll was placed and the anterior neck was marked with a marking pen and injected with 1% lidocaine with 1:100,000 epinephrine.  The area was prepped and draped in sterile fashion.  A vertical incision was made from the cricoid downward using a Bovie electrocautery.  Subcutaneous fat was removed.  The midline raphe of the strap muscles was divided and the muscles retracted laterally on each side.  The thyroid isthmus was easily identified and then elevated off the trachea and divided and ligated.  The tracheal wall was then exposed.  A horizontal incision was made between rings 2 and 3 of the trachea using 15 blade scalpel.  A 2-0 silk suture was tied then around the ring above and another one around the ring below the tracheostomy site as stay sutures.  The endotracheal tube cuff was deflated and the tube pulled back above the tracheostomy site.  The distal trachea was then suctioned and a #6 cuffed Shiley trach tube was placed in the  trachea without difficulty.  The cuff was inflated and the anesthesia was hooked to the tracheostomy tube.  The patient was successfully ventilated, but only a very brief period of hypoxia with the O2 sats to the 80s.  At this point, the endotracheal tube and apparatus was fully removed and the bed was turned 90 degrees from anesthesia.  The shoulder roll was removed.  A tooth guard was placed and first, a Dedo laryngoscope and then an anterior commissure laryngoscope were used to evaluate the pharynx and larynx including  the endolarynx.  Findings are noted above.  The laryngoscope was then removed.  A flexible laryngoscope was then passed through the tracheostomy tube to view the distal trachea.  The tracheostomy tube was slowly backed out to view more of the trachea.  The laryngoscope was then removed.  A trach dressing and trach tie were added after first suturing the trach flange to the skin using 2-0 silk suture in 4 quadrants.  The patient was then returned to Anesthesia for wake-up, was moved to the intensive care unit in a stable condition.     Antony Contras, MD     DDB/MEDQ  D:  08/19/2011  T:  08/19/2011  Job:  [id number]

## 2011-08-19 NOTE — Progress Notes (Signed)
CARE MANAGEMENT NOTE 08/19/2011  Patient:  [name], [name]   Account Number:  [id number]  Date Initiated:  08/16/2011  Documentation initiated by:  Lanier Clam  Subjective/Objective Assessment:   ADMITTED W/RESP FAILURE     Action/Plan:   FROM HOME W/FAMILY   Anticipated DC Date:  08/22/2011   Anticipated DC Plan:           Choice offered to / List presented to:             Status of service:  In process, will continue to follow Medicare Important Message given?   (If response is "NO", the following Medicare IM given date fields will be blank) Date Medicare IM given:   Date Additional Medicare IM given:    Discharge Disposition:    Per UR Regulation:  Reviewed for med. necessity/level of care/duration of stay  Comments:  [id number]/Ciearra Rufo,RN,BSN,CCM chart review/patient underwent new tracheostomy due to resp failure and inability to wean from vent. [id number]/  12/4 KATHY MAHABIR RN,BSN [phone].UR CHART REVIEWED.

## 2011-08-20 ENCOUNTER — Inpatient Hospital Stay (HOSPITAL_COMMUNITY): Payer: No Typology Code available for payment source

## 2011-08-20 DIAGNOSIS — J690 Pneumonitis due to inhalation of food and vomit: Secondary | ICD-10-CM

## 2011-08-20 DIAGNOSIS — J9600 Acute respiratory failure, unspecified whether with hypoxia or hypercapnia: Secondary | ICD-10-CM

## 2011-08-20 DIAGNOSIS — G9340 Encephalopathy, unspecified: Secondary | ICD-10-CM

## 2011-08-20 LAB — CULTURE, BAL-QUANTITATIVE W GRAM STAIN
Colony Count: 400
Culture: NO GROWTH

## 2011-08-20 LAB — CATH TIP CULTURE

## 2011-08-20 LAB — BASIC METABOLIC PANEL
BUN: 18 mg/dL (ref 6–23)
GFR calc non Af Amer: >90 mL/min (ref >90)
Glucose, Bld: 104 mg/dL — ABNORMAL HIGH (ref 70–99)
Potassium: 2.8 mEq/L — ABNORMAL LOW (ref 3.5–5.1)

## 2011-08-20 LAB — MAGNESIUM: Magnesium: 2 mg/dL (ref 1.5–2.5)

## 2011-08-20 LAB — CBC
Hemoglobin: 8.6 g/dL — ABNORMAL LOW (ref 12.0–15.0)
MCH: 23.2 pg — ABNORMAL LOW (ref 26.0–34.0)
MCHC: 30.9 g/dL (ref 30.0–36.0)

## 2011-08-20 LAB — CULTURE, RESPIRATORY W GRAM STAIN

## 2011-08-20 MED ORDER — PROPOFOL 10 MG/ML IV EMUL: 5.0000 ug/kg/min | INTRAVENOUS | Status: DC

## 2011-08-20 MED ORDER — POTASSIUM CHLORIDE 20 MEQ/15ML (10%) PO LIQD
80.0000 meq | Freq: Once | ORAL | Status: AC
  Administered 2011-08-20: 80 meq
  Filled 2011-08-20 (×2): qty 30

## 2011-08-20 MED ORDER — OXEPA PO LIQD
1000.0000 mL | ORAL | Status: DC
  Administered 2011-08-20: 1000 mL
  Filled 2011-08-20: qty 1000

## 2011-08-20 MED ORDER — BACITRACIN ZINC 500 UNIT/GM EX OINT
TOPICAL_OINTMENT | Freq: Two times a day (BID) | CUTANEOUS | Status: DC
  Administered 2011-08-20: 21:00:00 via TOPICAL
  Administered 2011-08-20: 1 via TOPICAL
  Administered 2011-08-21 – 2011-08-31 (×18): via TOPICAL
  Filled 2011-08-20: qty 15

## 2011-08-20 NOTE — Progress Notes (Signed)
eLink Physician-Brief Progress Note Patient Name: Ai Sonnenfeld DOB: [birth year] MRN: [id number]  Date of Service  08/20/2011   HPI/Events of Note   Hypokalemia  eICU Interventions  Potassium replaced   Intervention Category Intermediate Interventions: Electrolyte abnormality - evaluation and management  Renesmay Nesbitt 08/20/2011, 6:05 AM

## 2011-08-20 NOTE — Progress Notes (Signed)
Critical Care Medicine Note  Patient Details:    4 yowf s/p elbow surgery 11/24 > discharged home with pain medications post operatively. Noted at home to be minimally responsive after sleeping/pain pill combo.  Adm 11/25 with aspiration pneumonia, AMS.   Lines/tubes 11/25 OETT>>>11/28 11/28 (reintubation)>>>12/6 12/6 urgent reintubation stridor cw tracheal stenosis>> 11/26 right IJ CVL>>>12/5 12/6 R PICC>>> 12/6 ENT Trach>>>  Microbiology/Sepsis markers: 11/26 MRSA : neg 11/26 BCX2>>>neg 11/26 sputum: moderate Klebsiella oxytoca (pan sens, but resistance to Unasyn) 12/5 BAL>>>nml flora (prelim) 12/6 BAL>> 12/6 resp culture>>>few GNR>>> 12/5 CVL tip Cx>>>NGTD>> 12/6 PCT>>>0.53 12/6 Lactic acid>>>1.1  Anti-infectives:  unasyn 11/26 (Asp)>>>11/28 clinda 11/26 (asp)>>> 11/27 11/28 rocephin (K oxytoca)>>>12/6 12/5 Vanc (line sepsis)>>> 12/5 Primaxin (VAP)>>>  Studies/Events: 11/27 bilat ue doppler neg 12/3 agitated with WUA 12/4 bradys down with weaning attempts, tolerated Ps 5/5 12/5 reattempts weaning>>failed, agitation 12/6 extubated/reintubated, plan for trach 12/6 CT neck>> tracheal narrowing, small bilateral pleural effusions L>R, bronch 12/7 to OR for ENT Trach  Subjective:    Overnight Issues: failed extubation  Objective:  Vital signs for last 24 hours: Temp:  [100.2 F (37.9 C)-102.6 F (39.2 C)] 101.8 F (38.8 C) (12/08 0800) Pulse Rate:  [80-95] 95  (12/08 0800) Resp:  [15-30] 30  (12/08 0800) BP: (114-143)/(48-71) 130/52 mmHg (12/08 0958) SpO2:  [93 %-100 %] 98 % (12/08 0800) FiO2 (%):  [30 %-40 %] 30 % (12/08 0800) Weight:  [90 kg (198 lb 6.6 oz)-93.9 kg (207 lb 0.2 oz)] 198 lb 6.6 oz (90 kg) (12/08 0905)    Intake/Output this shift:  Intake/Output Summary (Last 24 hours) at 08/20/11 1015 Last data filed at 08/20/11 0700  Gross per 24 hour  Intake   2110 ml  Output    925 ml  Net   1185 ml     Physical Exam:  General:sedated on vent    Neuro: Opens eyes to verbal, moves lower EXT HEENT/Neck: jvd evident Resp: coarse bilat CVS: Tachy, regular, no m/r/g GI: soft, nontender, BS WNL, no r/g Skin: no rash Extremities: Wrapped left arm, L>R LE edema with pitting  PCXR:  . 12/6 : Pt rotated to right, air space disease improved from 12/5  12/7: Progressive asymmetric air space disease greater on the right may represent pulmonary edema although infectious infiltrate not excluded. No gross pneumothorax. Elevated right hemidiaphragm. Cardiomegaly.   Labs  Lab 08/20/11 0500 08/19/11 0417 08/18/11 0330  NA 137 138 137  K 2.8* 2.6* 3.5  CL 97 95* 94*  CO2 30 38* 37*  BUN 18 17 22   CREATININE 0.71 0.68 0.86  GLUCOSE 104* 100* 138*    Lab 08/20/11 0500 08/19/11 0417 08/18/11 0330  HGB 8.6* 8.5* 9.6*  HCT 27.8* 28.5* 30.8*  WBC 8.8 11.2* 20.5*  PLT 420* 391 383     Assessment/plan:  Acute Respiratory failure with ALI and ARDS. S/P c/w aspiration event. She self extubated 11/28, and re-intubated with-in an hour. Extubated 12/6, reintubated within an hour, again. Her CXR has improved a little from 12/5.  I/O remain neg. CVP 14. Bedside Trach 12/7 (ENT) Plan: -trach placement 12/7, expect rapid wean to ATC after -Continue bronchodilators -diprivan gtt, wean off today 12/8 -try to keep neg   -cont Lasix  Aspiration pneumonia/ Line sepsis -+ Klebsiella oxitoca, (intermediate sensitive to Unasyn). Rocephin started on 11/28. CVL pulled 12/5 (Cx). PICC placed 12/6.  -Added Vanc/ primaxin 12/5, plan 8 days -Rocephin x 8 days/ dc 12/6 -follow CXR  AMS/encephalopathy,  this is persistent. Plan:  - Cont Seroquel 200 tid 12/5 - qT ok/  -11/30 added clonadine (concern for w/d though not clear exactly how much she uses cns drugs at home)  Hypernatremia  Lab 08/20/11 0500 08/19/11 0417 08/18/11 0330  NA 137 138 137  Plan: -d/c extra water 11/30 -d5w to kvo -Lasix 40 bid -Chem as needed  Hypokalemia -replenish 12/8,  received KCL today -f/u bmp in am  Anemia: no evidence of bleeding  Lab 08/20/11 0500 08/19/11 0417 08/18/11 0330  HGB 8.6* 8.5* 9.6*    Plan: -transfuse for hemoglobin less than 7 -Continue proton pump inhibitor  Elbow surgery - called dr Melvyn Novas to reassess 12/4 .  Malnutrition -TF per nutrition, restart now that trach done    LOS: 13 days   Canary Brim, NP-C [facility] Pgr: [phone]   Care during the described time interval was provided by me and/or other providers on the critical care team.  I have reviewed this patient's available data, including medical history, events of note, physical examination and test results as part of my evaluation  CC time x 30 m

## 2011-08-21 ENCOUNTER — Inpatient Hospital Stay (HOSPITAL_COMMUNITY): Payer: No Typology Code available for payment source

## 2011-08-21 LAB — BASIC METABOLIC PANEL
BUN: 15 mg/dL (ref 6–23)
BUN: 16 mg/dL (ref 6–23)
CO2: 29 mEq/L (ref 19–32)
Calcium: 8.4 mg/dL (ref 8.4–10.5)
Chloride: 98 mEq/L (ref 96–112)
Creatinine, Ser: 0.63 mg/dL (ref 0.50–1.10)
Creatinine, Ser: 0.64 mg/dL (ref 0.50–1.10)
GFR calc Af Amer: >90 mL/min (ref >90)
GFR calc non Af Amer: >90 mL/min (ref >90)
Glucose, Bld: 107 mg/dL — ABNORMAL HIGH (ref 70–99)
Glucose, Bld: 137 mg/dL — ABNORMAL HIGH (ref 70–99)

## 2011-08-21 LAB — BLOOD GAS, ARTERIAL
Acid-Base Excess: 2.9 mmol/L — ABNORMAL HIGH (ref 0.0–2.0)
Bicarbonate: 26.3 mEq/L — ABNORMAL HIGH (ref 20.0–24.0)
Drawn by: 129801
FIO2: 0.3 %
pCO2 arterial: 36.7 mmHg (ref 35.0–45.0)
pO2, Arterial: 69.6 mmHg — ABNORMAL LOW (ref 80.0–100.0)

## 2011-08-21 LAB — CBC
HCT: 27 % — ABNORMAL LOW (ref 36.0–46.0)
MCH: 23.2 pg — ABNORMAL LOW (ref 26.0–34.0)
MCHC: 31.1 g/dL (ref 30.0–36.0)
MCV: 74.6 fL — ABNORMAL LOW (ref 78.0–100.0)
RDW: 19 % — ABNORMAL HIGH (ref 11.5–15.5)

## 2011-08-21 MED ORDER — POTASSIUM CHLORIDE 20 MEQ/15ML (10%) PO LIQD: 40.0000 meq | Freq: Every day | ORAL | Status: DC

## 2011-08-21 MED ORDER — OXEPA PO LIQD
1000.0000 mL | ORAL | Status: DC
  Administered 2011-08-21: 1000 mL
  Filled 2011-08-21 (×2): qty 1000

## 2011-08-21 MED ORDER — POTASSIUM CHLORIDE 20 MEQ/15ML (10%) PO LIQD
40.0000 meq | Freq: Every day | ORAL | Status: DC
  Administered 2011-08-22 – 2011-08-28 (×6): 40 meq via ORAL
  Filled 2011-08-21 (×11): qty 30

## 2011-08-21 MED ORDER — QUETIAPINE FUMARATE 100 MG PO TABS
100.0000 mg | ORAL_TABLET | Freq: Two times a day (BID) | ORAL | Status: DC
Start: 2011-08-21 — End: 2011-08-31
  Administered 2011-08-21 – 2011-08-31 (×20): 100 mg via ORAL
  Filled 2011-08-21 (×23): qty 1

## 2011-08-21 MED ORDER — DULOXETINE HCL 30 MG PO CPEP
30.0000 mg | ORAL_CAPSULE | Freq: Every day | ORAL | Status: DC
  Administered 2011-08-21 – 2011-08-31 (×11): 30 mg via ORAL
  Filled 2011-08-21 (×12): qty 1

## 2011-08-21 MED ORDER — POTASSIUM CHLORIDE 20 MEQ/15ML (10%) PO LIQD
40.0000 meq | Freq: Once | ORAL | Status: AC
  Administered 2011-08-21: 40 meq via ORAL
  Filled 2011-08-21: qty 30

## 2011-08-21 MED ORDER — VANCOMYCIN HCL IN DEXTROSE 1-5 GM/200ML-% IV SOLN
1000.0000 mg | Freq: Two times a day (BID) | INTRAVENOUS | Status: DC
  Administered 2011-08-21 – 2011-08-24 (×6): 1000 mg via INTRAVENOUS
  Filled 2011-08-21 (×7): qty 200

## 2011-08-21 MED ORDER — POTASSIUM CHLORIDE 20 MEQ/15ML (10%) PO LIQD
80.0000 meq | Freq: Once | ORAL | Status: AC
  Administered 2011-08-21: 80 meq
  Filled 2011-08-21: qty 60

## 2011-08-21 NOTE — Progress Notes (Signed)
Critical Care Medicine Note  Patient Details:    72 yowf s/p elbow surgery 11/24 > discharged home with pain medications post operatively. Noted at home to be minimally responsive after sleeping/pain pill combo.  Adm 11/25 with aspiration pneumonia, AMS.   Lines/tubes 11/25 OETT>>>11/28 11/28 (reintubation)>>>12/6 12/6 urgent reintubation stridor cw tracheal stenosis>> 11/26 right IJ CVL>>>12/5 12/6 R PICC>>> 12/6 ENT Trach>>>  Microbiology/Sepsis markers: 11/26 MRSA : neg 11/26 BCX2>>>neg 11/26 sputum: moderate Klebsiella oxytoca (pan sens, but resistance to Unasyn) 12/5 BAL>>>nml flora (prelim) 12/6 BAL>> negative 12/6 resp culture>>>few GNR>>> 12/5 CVL tip Cx>>>NGTD>> 12/6 PCT>>>0.53 12/6 Lactic acid>>>1.1  Anti-infectives:  unasyn 11/26 (Asp)>>>11/28 clinda 11/26 (asp)>>> 11/27 11/28 rocephin (K oxytoca)>>>12/6 12/5 Vanc (line sepsis)>>> 12/5 Primaxin (VAP)>>>  Studies/Events: 11/27 bilat ue doppler neg 12/3 agitated with WUA 12/4 bradys down with weaning attempts, tolerated Ps 5/5 12/5 reattempts weaning>>failed, agitation 12/6 extubated/reintubated, plan for trach 12/6 CT neck>> tracheal narrowing, small bilateral pleural effusions L>R, bronch 12/7 to OR for ENT Trach  Subjective:    Overnight Issues: failed extubation  Objective:  Vital signs for last 24 hours: Temp:  [95 F (35 C)-101.1 F (38.4 C)] 100.6 F (38.1 C) (12/09 1004) Pulse Rate:  [81-109] 100  (12/09 1004) Resp:  [22-35] 26  (12/09 1004) BP: (109-136)/(30-72) 109/60 mmHg (12/09 1004) SpO2:  [90 %-100 %] 99 % (12/09 1004) FiO2 (%):  [28 %-40 %] 30 % (12/09 1004) Weight:  [89.6 kg (197 lb 8.5 oz)] 197 lb 8.5 oz (89.6 kg) (12/09 0400)    Intake/Output this shift:  Intake/Output Summary (Last 24 hours) at 08/21/11 1039 Last data filed at 08/21/11 1000  Gross per 24 hour  Intake   1690 ml  Output   2571 ml  Net   -881 ml     Physical Exam:  General:sedated on vent  Neuro: Opens  eyes to verbal, moves lower EXT HEENT/Neck: jvd evident Resp: coarse bilat CVS: Tachy, regular, no m/r/g GI: soft, nontender, BS WNL, no r/g Skin: no rash Extremities: Wrapped left arm, L>R LE edema with pitting  PCXR:  08/21/2011  *RADIOLOGY REPORT*  Clinical Data: Respiratory difficulty  PORTABLE CHEST - 1 VIEW  Comparison: Yesterday  Findings: Stable tubular devices and basilar atelectasis left greater than right.  Small left effusion is worse.  IMPRESSION: Small left effusion.  Original Report Authenticated By: Donavan Burnet, M.D.   Labs  Lab 08/21/11 0500 08/20/11 0500 08/19/11 0417  NA 135 137 138  K 2.8* 2.8* 2.6*  CL 98 97 95*  CO2 29 30 38*  BUN 15 18 17   CREATININE 0.63 0.71 0.68  GLUCOSE 107* 104* 100*    Lab 08/21/11 0500 08/20/11 0500 08/19/11 0417  HGB 8.4* 8.6* 8.5*  HCT 27.0* 27.8* 28.5*  WBC 6.9 8.8 11.2*  PLT 433* 420* 391     Assessment/plan:  Acute Respiratory failure with ALI and ARDS. S/P c/w aspiration event. She self extubated 11/28, and re-intubated with-in an hour. Extubated 12/6, reintubated within an hour, again. Her CXR has improved a little from 12/5.  I/O remain neg. CVP 14. Bedside Trach 12/7 (ENT) Plan: -trach placement 12/7, tolerated ATC after all day 12/8 then back to MV overnight -will attempt TC 24 hours 12/9 -Continue bronchodilators -try to keep neg   -cont Lasix  Aspiration pneumonia/ Line sepsis -+ Klebsiella oxitoca, (intermediate sensitive to Unasyn). Rocephin started on 11/28. CVL pulled 12/5 (Cx). PICC placed 12/6.  -Added Vanc/ primaxin 12/5, plan 8 days -Rocephin  x 8 days/ dc 12/6 -follow CXR  AMS/encephalopathy, improved Plan:  - Decrease Seroquel 100mg  bid  12/9 - d/c clonadine 12/9 - restart home cymbalta  Hypernatremia  Lab 08/21/11 0500 08/20/11 0500 08/19/11 0417  NA 135 137 138  Plan: -d/c extra water 11/30 -d/c d5w -Lasix 40 bid  Hypokalemia  Lab 08/21/11 0500 08/20/11 0500 08/19/11 0417 08/18/11  0330 08/17/11 0545  K 2.8* 2.8* 2.6* 3.5 3.1*   -replenish 12/9, received KCL today -add scheduled K since on lasix -f/u bmp in am  Anemia: no evidence of bleeding  Lab 08/21/11 0500 08/20/11 0500 08/19/11 0417  HGB 8.4* 8.6* 8.5*    Plan: -transfuse for hemoglobin less than 7 -Continue proton pump inhibitor  Elbow surgery  - Dr Melvyn Novas following  Malnutrition -TF restarted    LOS: 14 days    Levy Pupa, MD, PhD 08/21/2011, 10:47 AM [facility] [phone] or if no answer [phone]

## 2011-08-21 NOTE — Progress Notes (Addendum)
CRITICAL VALUE ALERT  Critical value received:  Vancomycin level  27.1  Date of notification:  08/21/2011  Time of notification:  1734  Critical value read back: yes  Nurse who received alert: Roney Jaffe, RN  MD notified (1st page):  Notified Pharmacist Charolotte Eke, PharmD/MD order pharmacist to dose.  Time of first page:  1735  MD notified (2nd page):  Time of second page:  Responding MD: PharmD Charolotte Eke  Time MD responded:  [phone]

## 2011-08-21 NOTE — Progress Notes (Signed)
ANTIBIOTIC CONSULT NOTE - Follow-Up  Pharmacy Consult for Vancomycin Indication: HAP/line sepsis  Allergies  Allergen Reactions  . Meperidine Hcl Itching    REACTION: hives, itich    Patient Measurements: Height:  (5'4") Weight: 197 lb 8.5 oz (89.6 kg) IBW/kg (Calculated) : 47.8     Vital Signs: Temp: 99.7 F (37.6 C) (12/09 1707) Temp src: Core (Comment) (12/09 1707) BP: 123/56 mmHg (12/09 1707) Pulse Rate: 92  (12/09 1707) Intake/Output from previous day: 12/08 0701 - 12/09 0700 In: 1510 [I.V.:930; NG/GT:180; IV Piggyback:400] Out: 1803 [Urine:1700; Stool:103] Intake/Output from this shift: Total I/O In: 780 [I.V.:280; NG/GT:500] Out: 1240 [Urine:440; Stool:800]  Labs:  Basename 08/21/11 1213 08/21/11 0500 08/20/11 0500 08/19/11 0417  WBC -- 6.9 8.8 11.2*  HGB -- 8.4* 8.6* 8.5*  PLT -- 433* 420* 391  LABCREA -- -- -- --  CREATININE 0.64 0.63 0.71 --   Estimated Creatinine Clearance: 82.8 ml/min (by C-G formula based on Cr of 0.64).  Basename 08/21/11 1650  VANCOTROUGH 27.1*  VANCOPEAK --  Drue Dun --  GENTTROUGH --  GENTPEAK --  GENTRANDOM --  TOBRATROUGH --  TOBRAPEAK --  TOBRARND --  AMIKACINPEAK --  AMIKACINTROU --  AMIKACIN --     Microbiology: Recent Results (from the past 720 hour(s))  SURGICAL PCR SCREEN     Status: Normal   Collection Time   08/05/11  1:51 PM      Component Value Range Status Comment   MRSA, PCR NEGATIVE  NEGATIVE  Final    Staphylococcus aureus NEGATIVE  NEGATIVE  Final   MRSA PCR SCREENING     Status: Normal   Collection Time   08/08/11  2:23 AM      Component Value Range Status Comment   MRSA by PCR NEGATIVE  NEGATIVE  Final   CULTURE, BLOOD (ROUTINE X 2)     Status: Normal   Collection Time   08/08/11  3:15 AM      Component Value Range Status Comment   Specimen Description BLOOD RIGHT ANTECUBITAL   Final    Special Requests BOTTLES DRAWN AEROBIC AND ANAEROBIC 5 CC EA   Final    Setup Time [id number]    Final    Culture NO GROWTH 5 DAYS   Final    Report Status 08/14/2011 FINAL   Final   CULTURE, BLOOD (ROUTINE X 2)     Status: Normal   Collection Time   08/08/11  3:20 AM      Component Value Range Status Comment   Specimen Description BLOOD RIGHT HAND   Final    Special Requests BOTTLES DRAWN AEROBIC AND ANAEROBIC 5 CC EA   Final    Setup Time [id number]   Final    Culture NO GROWTH 5 DAYS   Final    Report Status 08/14/2011 FINAL   Final   CULTURE, RESPIRATORY     Status: Normal   Collection Time   08/08/11  4:39 AM      Component Value Range Status Comment   Specimen Description ENDOTRACHEAL   Final    Special Requests NONE   Final    Gram Stain     Final    Value: MODERATE WBC PRESENT,BOTH PMN AND MONONUCLEAR     NO SQUAMOUS EPITHELIAL CELLS SEEN     RARE GRAM POSITIVE RODS   Culture MODERATE KLEBSIELLA OXYTOCA   Final    Report Status 08/10/2011 FINAL   Final    Organism ID, Bacteria KLEBSIELLA OXYTOCA  Final   CATH TIP CULTURE     Status: Normal   Collection Time   08/17/11  5:01 PM      Component Value Range Status Comment   Specimen Description CATH TIP   Final    Special Requests NONE   Final    Culture NO GROWTH 2 DAYS   Final    Report Status 08/20/2011 FINAL   Final   CULTURE, BAL-QUANTITATIVE     Status: Normal   Collection Time   08/17/11 11:30 PM      Component Value Range Status Comment   Specimen Description BRONCHIAL ALVEOLAR LAVAGE   Final    Special Requests NONE   Final    Gram Stain     Final    Value: FEW WBC PRESENT,BOTH PMN AND MONONUCLEAR     NO SQUAMOUS EPITHELIAL CELLS SEEN     NO ORGANISMS SEEN   Colony Count 400 COLONIES/ML   Final    Culture Non-Pathogenic Oropharyngeal-type Flora Isolated.   Final    Report Status 08/20/2011 FINAL   Final   URINE CULTURE     Status: Normal   Collection Time   08/18/11 10:09 AM      Component Value Range Status Comment   Specimen Description URINE, CATHETERIZED   Final    Special Requests Normal    Final    Setup Time [id number]   Final    Colony Count NO GROWTH   Final    Culture NO GROWTH   Final    Report Status 08/19/2011 FINAL   Final   CULTURE, RESPIRATORY     Status: Normal   Collection Time   08/18/11 10:20 AM      Component Value Range Status Comment   Specimen Description SPUTUM   Final    Special Requests NONE   Final    Gram Stain     Final    Value: ABUNDANT WBC NO SQUAMOUS EPITHELIAL CELLS SEEN     NO ORGANISMS SEEN   Culture FEW KLEBSIELLA OXYTOCA   Final    Report Status 08/20/2011 FINAL   Final    Organism ID, Bacteria KLEBSIELLA OXYTOCA   Final   CULTURE, BLOOD (ROUTINE X 2)     Status: Normal (Preliminary result)   Collection Time   08/18/11 10:55 AM      Component Value Range Status Comment   Specimen Description BLOOD LEFT HAND   Final    Special Requests BOTTLES DRAWN AEROBIC AND ANAEROBIC 2CC   Final    Setup Time [id number]   Final    Culture     Final    Value:        BLOOD CULTURE RECEIVED NO GROWTH TO DATE CULTURE WILL BE HELD FOR 5 DAYS BEFORE ISSUING A FINAL NEGATIVE REPORT   Report Status PENDING   Incomplete   CULTURE, BLOOD (ROUTINE X 2)     Status: Normal (Preliminary result)   Collection Time   08/18/11 11:15 AM      Component Value Range Status Comment   Specimen Description BLOOD LEFT HAND   Final    Special Requests BOTTLES DRAWN AEROBIC ONLY 2CC   Final    Setup Time [id number]   Final    Culture     Final    Value:        BLOOD CULTURE RECEIVED NO GROWTH TO DATE CULTURE WILL BE HELD FOR 5 DAYS BEFORE ISSUING A FINAL NEGATIVE REPORT   Report Status  PENDING   Incomplete   CULTURE, BAL-QUANTITATIVE     Status: Normal   Collection Time   08/18/11  3:39 PM      Component Value Range Status Comment   Specimen Description BRONCHIAL ALVEOLAR LAVAGE   Final    Special Requests NONE   Final    Gram Stain     Final    Value: FEW WBC PRESENT,BOTH PMN AND MONONUCLEAR     NO SQUAMOUS EPITHELIAL CELLS SEEN     NO ORGANISMS SEEN   Colony  Count NO GROWTH   Final    Culture NO GROWTH 2 DAYS   Final    Report Status 08/20/2011 FINAL   Final   CLOSTRIDIUM DIFFICILE BY PCR     Status: Normal   Collection Time   08/21/11  1:12 AM      Component Value Range Status Comment   C difficile by pcr NEGATIVE  NEGATIVE  Final     Medical History: Past Medical History  Diagnosis Date  . Ovarian cyst   . Pneumonia     hx x4  . Cancer     precancerous cervix  . GERD (gastroesophageal reflux disease)   . Headache   . Hiatal hernia   . PONV (postoperative nausea and vomiting) 08/06/11  . Difficult intubation     Medications:  Scheduled:     . antiseptic oral rinse  15 mL Mouth Rinse QID  . bacitracin   Topical BID  . chlorhexidine  15 mL Mouth Rinse BID  . DULoxetine  30 mg Oral Daily  . feeding supplement (OXEPA)  1,000 mL Per Tube Q24H  . feeding supplement  30 mL Per Tube TID WC  . furosemide  40 mg Per Tube BID  . imipenem-cilastatin  500 mg Intravenous Q6H  . pantoprazole sodium  40 mg Per Tube Q1200  . potassium chloride  40 mEq Oral Once  . potassium chloride  40 mEq Oral Daily  . potassium chloride  80 mEq Per Tube Once  . QUEtiapine  100 mg Oral BID  . sodium chloride  10 mL Intracatheter Q12H  . vancomycin  1,000 mg Intravenous Q8H  . DISCONTD: cloNIDine  0.1 mg Oral BID  . DISCONTD: potassium chloride  40 mEq Per Tube Daily  . DISCONTD: QUEtiapine  200 mg Oral BID   Infusions:     . sodium chloride 20 mL/hr at 08/21/11 1614  . DISCONTD: dextrose 20 mL (08/20/11 1332)  . DISCONTD: feeding supplement (OXEPA) 1,000 mL (08/20/11 1614)  . DISCONTD: propofol Stopped (08/20/11 1000)   Assessment: Day #4 of 8 Vancomycin 1g IV q8/Primaxin 500mg  IV q6 for HAP/line sepsis.  Vanc trough above goal range.  Goal of Therapy:  Vancomycin trough level 15-20 mcg/ml  Plan:  1. Change Vanc to 1g IV q12h.  Charolotte Eke, PharmD, pager [phone]. 08/21/2011,5:50 PM.

## 2011-08-21 NOTE — Progress Notes (Signed)
ANTIBIOTIC CONSULT NOTE - Follow-Up  Pharmacy Consult for Vancomycin Indication: HAP/line sepsis  Allergies  Allergen Reactions  . Meperidine Hcl Itching    REACTION: hives, itich    Patient Measurements: Height:  (5'4") Weight: 197 lb 8.5 oz (89.6 kg) IBW/kg (Calculated) : 47.8     Vital Signs: Temp: 100.6 F (38.1 C) (12/09 0900) Temp src: Core (Comment) (12/09 0900) BP: 109/60 mmHg (12/09 1004) Pulse Rate: 103  (12/09 0900) Intake/Output from previous day: 12/08 0701 - 12/09 0700 In: 1490 [I.V.:930; NG/GT:160; IV Piggyback:400] Out: 1803 [Urine:1700; Stool:103] Intake/Output from this shift: Total I/O In: 40 [I.V.:40] Out: 860 [Urine:60; Stool:800]  Labs:  [facility] 08/21/11 0500 08/20/11 0500 08/19/11 0417  WBC 6.9 8.8 11.2*  HGB 8.4* 8.6* 8.5*  PLT 433* 420* 391  LABCREA -- -- --  CREATININE 0.63 0.71 0.68   Estimated Creatinine Clearance: 82.8 ml/min (by C-G formula based on Cr of 0.63).  Basename 08/18/11 1700  VANCOTROUGH 19.4  VANCOPEAK --  Drue Dun --  GENTTROUGH --  GENTPEAK --  GENTRANDOM --  TOBRATROUGH --  TOBRAPEAK --  TOBRARND --  AMIKACINPEAK --  AMIKACINTROU --  AMIKACIN --     Microbiology: Recent Results (from the past 720 hour(s))  SURGICAL PCR SCREEN     Status: Normal   Collection Time   08/05/11  1:51 PM      Component Value Range Status Comment   MRSA, PCR NEGATIVE  NEGATIVE  Final    Staphylococcus aureus NEGATIVE  NEGATIVE  Final   MRSA PCR SCREENING     Status: Normal   Collection Time   08/08/11  2:23 AM      Component Value Range Status Comment   MRSA by PCR NEGATIVE  NEGATIVE  Final   CULTURE, BLOOD (ROUTINE X 2)     Status: Normal   Collection Time   08/08/11  3:15 AM      Component Value Range Status Comment   Specimen Description BLOOD RIGHT ANTECUBITAL   Final    Special Requests BOTTLES DRAWN AEROBIC AND ANAEROBIC 5 CC EA   Final    Setup Time [id number]   Final    Culture NO GROWTH 5 DAYS   Final      Report Status 08/14/2011 FINAL   Final   CULTURE, BLOOD (ROUTINE X 2)     Status: Normal   Collection Time   08/08/11  3:20 AM      Component Value Range Status Comment   Specimen Description BLOOD RIGHT HAND   Final    Special Requests BOTTLES DRAWN AEROBIC AND ANAEROBIC 5 CC EA   Final    Setup Time [id number]   Final    Culture NO GROWTH 5 DAYS   Final    Report Status 08/14/2011 FINAL   Final   CULTURE, RESPIRATORY     Status: Normal   Collection Time   08/08/11  4:39 AM      Component Value Range Status Comment   Specimen Description ENDOTRACHEAL   Final    Special Requests NONE   Final    Gram Stain     Final    Value: MODERATE WBC PRESENT,BOTH PMN AND MONONUCLEAR     NO SQUAMOUS EPITHELIAL CELLS SEEN     RARE GRAM POSITIVE RODS   Culture MODERATE KLEBSIELLA OXYTOCA   Final    Report Status 08/10/2011 FINAL   Final    Organism ID, Bacteria KLEBSIELLA OXYTOCA   Final   CATH TIP  CULTURE     Status: Normal   Collection Time   08/17/11  5:01 PM      Component Value Range Status Comment   Specimen Description CATH TIP   Final    Special Requests NONE   Final    Culture NO GROWTH 2 DAYS   Final    Report Status 08/20/2011 FINAL   Final   CULTURE, BAL-QUANTITATIVE     Status: Normal   Collection Time   08/17/11 11:30 PM      Component Value Range Status Comment   Specimen Description BRONCHIAL ALVEOLAR LAVAGE   Final    Special Requests NONE   Final    Gram Stain     Final    Value: FEW WBC PRESENT,BOTH PMN AND MONONUCLEAR     NO SQUAMOUS EPITHELIAL CELLS SEEN     NO ORGANISMS SEEN   Colony Count 400 COLONIES/ML   Final    Culture Non-Pathogenic Oropharyngeal-type Flora Isolated.   Final    Report Status 08/20/2011 FINAL   Final   URINE CULTURE     Status: Normal   Collection Time   08/18/11 10:09 AM      Component Value Range Status Comment   Specimen Description URINE, CATHETERIZED   Final    Special Requests Normal   Final    Setup Time [id number]   Final     Colony Count NO GROWTH   Final    Culture NO GROWTH   Final    Report Status 08/19/2011 FINAL   Final   CULTURE, RESPIRATORY     Status: Normal   Collection Time   08/18/11 10:20 AM      Component Value Range Status Comment   Specimen Description SPUTUM   Final    Special Requests NONE   Final    Gram Stain     Final    Value: ABUNDANT WBC NO SQUAMOUS EPITHELIAL CELLS SEEN     NO ORGANISMS SEEN   Culture FEW KLEBSIELLA OXYTOCA   Final    Report Status 08/20/2011 FINAL   Final    Organism ID, Bacteria KLEBSIELLA OXYTOCA   Final   CULTURE, BLOOD (ROUTINE X 2)     Status: Normal (Preliminary result)   Collection Time   08/18/11 10:55 AM      Component Value Range Status Comment   Specimen Description BLOOD LEFT HAND   Final    Special Requests BOTTLES DRAWN AEROBIC AND ANAEROBIC 2CC   Final    Setup Time [id number]   Final    Culture     Final    Value:        BLOOD CULTURE RECEIVED NO GROWTH TO DATE CULTURE WILL BE HELD FOR 5 DAYS BEFORE ISSUING A FINAL NEGATIVE REPORT   Report Status PENDING   Incomplete   CULTURE, BLOOD (ROUTINE X 2)     Status: Normal (Preliminary result)   Collection Time   08/18/11 11:15 AM      Component Value Range Status Comment   Specimen Description BLOOD LEFT HAND   Final    Special Requests BOTTLES DRAWN AEROBIC ONLY 2CC   Final    Setup Time [id number]   Final    Culture     Final    Value:        BLOOD CULTURE RECEIVED NO GROWTH TO DATE CULTURE WILL BE HELD FOR 5 DAYS BEFORE ISSUING A FINAL NEGATIVE REPORT   Report Status PENDING   Incomplete  CULTURE, BAL-QUANTITATIVE     Status: Normal   Collection Time   08/18/11  3:39 PM      Component Value Range Status Comment   Specimen Description BRONCHIAL ALVEOLAR LAVAGE   Final    Special Requests NONE   Final    Gram Stain     Final    Value: FEW WBC PRESENT,BOTH PMN AND MONONUCLEAR     NO SQUAMOUS EPITHELIAL CELLS SEEN     NO ORGANISMS SEEN   Colony Count NO GROWTH   Final    Culture NO GROWTH 2  DAYS   Final    Report Status 08/20/2011 FINAL   Final     Medical History: Past Medical History  Diagnosis Date  . Ovarian cyst   . Pneumonia     hx x4  . Cancer     precancerous cervix  . GERD (gastroesophageal reflux disease)   . Headache   . Hiatal hernia   . PONV (postoperative nausea and vomiting) 08/06/11  . Difficult intubation     Medications:  Scheduled:     . antiseptic oral rinse  15 mL Mouth Rinse QID  . bacitracin   Topical BID  . chlorhexidine  15 mL Mouth Rinse BID  . cloNIDine  0.1 mg Oral BID  . feeding supplement  30 mL Per Tube TID WC  . furosemide  40 mg Per Tube BID  . imipenem-cilastatin  500 mg Intravenous Q6H  . pantoprazole sodium  40 mg Per Tube Q1200  . potassium chloride  80 mEq Per Tube Once  . QUEtiapine  200 mg Oral BID  . sodium chloride  10 mL Intracatheter Q12H  . vancomycin  1,000 mg Intravenous Q8H   Infusions:     . sodium chloride 20 mL/hr at 08/20/11 0558  . dextrose 20 mL (08/20/11 1332)  . feeding supplement (OXEPA) 1,000 mL (08/20/11 1614)  . propofol Stopped (08/20/11 1000)   Assessment: Day #4 of 8 Vancomycin 1g IV q8/Primaxin 500mg  IV q6 for HAP/line sepsis Tmax 100.6 Stable WBC and SCr  Goal of Therapy:  Vancomycin trough level 15-20 mcg/ml  Plan:  1. Continue Vancomycin 1g IV q8 for now 2. Continue Primaxin 500mg  IV q6 3. Will recheck a trough since last one was checked on 12/6 and was at upper end of goal range and it likely has since accumulated some.   Hessie Knows, PharmD, BCPS 08/21/2011 10:22 AM [phone]

## 2011-08-21 NOTE — Plan of Care (Signed)
Problem: Phase II Progression Outcomes Goal: Date pt extubated/weaned off vent Outcome: Completed/Met Date Met:  08/21/11 Tracheostomy placed 08-19-11.  Trach collar started 08-20-11 during the day.  Ventilator for rest at night.  Trach collar 08-21-11 during the day. Goal: Time pt extubated/weaned off vent Outcome: Progressing Still requiring night ventilator for rest. Goal: Progress activities as ordered Outcome: Progressing PROM.  Encouraging movement. Goal: Tolerating prescribed nutrition plan Outcome: Progressing Tolerating tube feeding at 20 ml an hour. Goal: Pain controlled CPOT score 0.  No signs of pain. Goal: Code status re-addressed Remains full code. Goal: Discharge/transfer plan updated Progressing.  Problem: Phase III Progression Outcomes Goal: Hemodynamically stable Vital signs remain WNL.  Problem: Problem: Respiratory Progression Goal: ABLE TO EXTUBATE Outcome: Completed/Met Date Met:  08/21/11 Tracheostomy placed 08-19-11.

## 2011-08-21 NOTE — Progress Notes (Signed)
eLink Physician-Brief Progress Note Patient Name: [name] DOB: 10/08/1955 MRN: [id number]  Date of Service  08/21/2011   HPI/Events of Note   hypokalemia  eICU Interventions  Potassium replaced - to have BMET at 12 noon for followup on K level   Intervention Category Major Interventions: Electrolyte abnormality - evaluation and management  Abreanna Drawdy 08/21/2011, 6:44 AM

## 2011-08-22 ENCOUNTER — Encounter (HOSPITAL_COMMUNITY): Payer: Self-pay | Admitting: Otolaryngology

## 2011-08-22 LAB — BASIC METABOLIC PANEL
CO2: 28 mEq/L (ref 19–32)
Calcium: 8.5 mg/dL (ref 8.4–10.5)
Chloride: 100 mEq/L (ref 96–112)
Creatinine, Ser: 0.57 mg/dL (ref 0.50–1.10)
Glucose, Bld: 114 mg/dL — ABNORMAL HIGH (ref 70–99)

## 2011-08-22 MED ORDER — JEVITY 1.2 CAL PO LIQD
1000.0000 mL | ORAL | Status: DC
  Administered 2011-08-22 – 2011-08-24 (×3): 1000 mL
  Filled 2011-08-22 (×4): qty 1000

## 2011-08-22 MED ORDER — FUROSEMIDE 8 MG/ML PO SOLN
40.0000 mg | Freq: Two times a day (BID) | ORAL | Status: DC
  Administered 2011-08-23 – 2011-08-24 (×3): 40 mg
  Filled 2011-08-22: qty 5
  Filled 2011-08-22 (×3): qty 4
  Filled 2011-08-22 (×2): qty 5
  Filled 2011-08-22: qty 4
  Filled 2011-08-22: qty 5

## 2011-08-22 MED ORDER — JEVITY 1.2 CAL PO LIQD: 1000.0000 mL | ORAL | Status: DC

## 2011-08-22 NOTE — Progress Notes (Addendum)
Critical Care Medicine Note  Patient Details:    53 yowf [name]/p elbow surgery 11/24 > discharged home with pain medications post operatively. Noted at home to be minimally responsive after sleeping/pain pill combo.  Adm 11/25 with aspiration pneumonia, AMS.   Lines/tubes 11/25 OETT>>>11/28 11/28 (reintubation)>>>12/6 12/6 urgent reintubation stridor cw tracheal stenosis>> 11/26 right IJ CVL>>>12/5 12/6 R PICC>>> 12/6 ENT Trach>>>  Microbiology/Sepsis markers: 11/26 MRSA : neg 11/26 BCX2>>>neg 11/26 sputum: moderate Klebsiella oxytoca (pan sens, but resistance to Unasyn) 12/5 BAL>>>nml flora (prelim) 12/6 BAL>> negative 12/6 resp culture>>>few GNR>>> 12/5 CVL tip Cx>>>NGTD>> 12/6 PCT>>>0.53 12/6 Lactic acid>>>1.1  Anti-infectives:  unasyn 11/26 (Asp)>>>11/28 clinda 11/26 (asp)>>> 11/27 11/28 rocephin (K oxytoca)>>>12/6 12/5 Vanc (line sepsis)>>> 12/5 Primaxin (VAP)>>>  Studies/Events: 11/27 bilat ue doppler neg 12/3 agitated with WUA 12/4 bradys down with weaning attempts, tolerated Ps 5/5 12/5 reattempts weaning>>failed, agitation 12/6 extubated/reintubated, plan for trach 12/6 CT neck>> tracheal narrowing, small bilateral pleural effusions L>R, bronch 12/7 to OR for ENT Trach  Subjective:    Overnight Issues: looks good on t-collar  Objective:  Vital signs for last 24 hours: Temp:  [99.1 F (37.3 C)-100 F (37.8 C)] 100 F (37.8 C) (12/10 0800) Pulse Rate:  [65-101] 101  (12/10 0914) Resp:  [18-32] 19  (12/10 0914) BP: (108-144)/(45-77) 111/56 mmHg (12/10 0800) SpO2:  [95 %-100 %] 98 % (12/10 0914) FiO2 (%):  [28 %-30 %] 28 % (12/10 1148) Weight:  [196 lb 10.4 oz (89.2 kg)] 196 lb 10.4 oz (89.2 kg) (12/10 0500)    Intake/Output this shift:  Intake/Output Summary (Last 24 hours) at 08/22/11 1301 Last data filed at 08/22/11 0626  Gross per 24 hour  Intake   1120 ml  Output   1370 ml  Net   -250 ml     Physical Exam:  General:NAD Neuro:  intact HEENT/Neck: jvd evident Resp: coarse bilat CVS: Tachy, regular, no m/r/g GI: soft, nontender, BS WNL, no r/g Skin: no rash Extremities: Wrapped left arm, L>R LE edema with pitting Lt arm wrap taken down. Sutures clean and dry, wound ok. Re wrapped   PCXR:  08/21/2011  *RADIOLOGY REPORT*  Clinical Data: Respiratory difficulty  PORTABLE CHEST - 1 VIEW  Comparison: Yesterday  Findings: Stable tubular devices and basilar atelectasis left greater than right.  Small left effusion is worse.  IMPRESSION: Small left effusion.  Original Report Authenticated By: Donavan Burnet, M.D.   Labs  Lab 08/22/11 0300 08/21/11 1213 08/21/11 0500  NA 136 137 135  K 3.3* 3.1* 2.8*  CL 100 101 98  CO2 28 30 29   BUN 14 16 15   CREATININE 0.57 0.64 0.63  GLUCOSE 114* 137* 107*    Lab 08/21/11 0500 08/20/11 0500 08/19/11 0417  HGB 8.4* 8.6* 8.5*  HCT 27.0* 27.8* 28.5*  WBC 6.9 8.8 11.2*  PLT 433* 420* 391     Assessment/plan:  Acute Respiratory failure with ALI and ARDS. [name]/P c/w aspiration event. She self extubated 11/28, and re-intubated with-in an hour. Extubated 12/6, reintubated within an hour, again. Her CXR has improved a little from 12/5.  I/O remain neg. CVP 14. Bedside Trach 12/7 (ENT) Plan: -trach placement 12/7, tolerated ATC after all day 12/8 then TC 24 hours 12/9 -Continue bronchodilators -try to keep neg   -cont Lasix  Aspiration pneumonia/ Line sepsis -+ Klebsiella oxitoca, (intermediate sensitive to Unasyn). Rocephin started on 11/28. CVL pulled 12/5 (Cx). PICC placed 12/6.  -Added Vanc/ primaxin 12/5, plan 8 days -  Rocephin x 8 days/ dc 12/6 -follow CXR  AMS/encephalopathy, improved Plan:  - Decrease Seroquel 100mg  bid  12/9 - d/c clonadine 12/9 - restart home cymbalta -resolving  Hypernatremia  Lab 08/22/11 0300 08/21/11 1213 08/21/11 0500  NA 136 137 135  Plan: -d/c extra water 11/30 -d/c d5w -Lasix 40 bid  Hypokalemia  Lab 08/22/11 0300 08/21/11 1213  08/21/11 0500 08/20/11 0500 08/19/11 0417  K 3.3* 3.1* 2.8* 2.8* 2.6*   -replenish 12/9, received KCL today -add scheduled K since on lasix -f/u bmp in am  Anemia: no evidence of bleeding  Lab 08/21/11 0500 08/20/11 0500 08/19/11 0417  HGB 8.4* 8.6* 8.5*    Plan: -transfuse for hemoglobin less than 7 -Continue proton pump inhibitor  Elbow surgery  - Dr Melvyn Novas following -wound   Malnutrition -TF restarted -speech eval ordered 12/10 for cuff down    LOS: 15 days    Sara [name],Sara [name]   Attending Addendum:  I have seen the patient, discussed the issues, test results and plans with [name]. Sara [name]. I agree with the Assessment and Plans as outlined above.   Levy Pupa, MD, PhD 08/22/2011, 2:59 PM [facility] [phone] or if no answer [phone]

## 2011-08-22 NOTE — Progress Notes (Signed)
Speech Language/Pathology  Order received for swallow evaluation.  Discussed need for Passy-Muir Valve trial prior to Swallow eval. with Dr. Delton Coombes, who agreed and provided order.  S:  Patient is alert and mouthing words to communicate.  O & A:  Good articulation, and relatively good intelligibility of lip reading.  Attempted use of writing, which is not ledgeble  at this time.  Patient was unable to effectively or efficiently use a communication board.   Patient has a #8 cuffed shiley, with cuff deflation tolerated.  Copious, thick, yellow secretions are coughed from the trach.  Patient demonstrates a strong cough.  P:  Appropriate for PMSV trial.  SLP will return  12/11. Maryjo Rochester T

## 2011-08-22 NOTE — Progress Notes (Signed)
Nutrition Follow-up  Pt s/p trach Thursday; on trach collar during day, vent support at night. Pt resumed Oxepa at 20 mL/hr.  Diprivan discontinued.  Needs reassess due to change in status: 2050-2230 kcal, 82-95 g protein.  TG checked 12/8, wnl.  TG: 133 mg/dL  Diet Order:  NPO  Meds: Scheduled Meds:   . antiseptic oral rinse  15 mL Mouth Rinse QID  . bacitracin   Topical BID  . chlorhexidine  15 mL Mouth Rinse BID  . DULoxetine  30 mg Oral Daily  . feeding supplement  30 mL Per Tube TID WC  . furosemide  40 mg Per Tube BID  . imipenem-cilastatin  500 mg Intravenous Q6H  . pantoprazole sodium  40 mg Per Tube Q1200  . potassium chloride  40 mEq Oral Once  . potassium chloride  40 mEq Oral Daily  . QUEtiapine  100 mg Oral BID  . sodium chloride  10 mL Intracatheter Q12H  . vancomycin  1,000 mg Intravenous Q12H  . DISCONTD: feeding supplement (OXEPA)  1,000 mL Per Tube Q24H  . DISCONTD: vancomycin  1,000 mg Intravenous Q8H   Continuous Infusions:   . sodium chloride 20 mL/hr at 08/21/11 1614  . feeding supplement (JEVITY 1.2)    . DISCONTD: feeding supplement (OXEPA) 1,000 mL (08/20/11 1614)   PRN Meds:.acetaminophen, albuterol, promethazine, sodium chloride  Labs:  CMP     Component Value Date/Time   NA 136 08/22/2011 0300   K 3.3* 08/22/2011 0300   CL 100 08/22/2011 0300   CO2 28 08/22/2011 0300   GLUCOSE 114* 08/22/2011 0300   BUN 14 08/22/2011 0300   CREATININE 0.57 08/22/2011 0300   CALCIUM 8.5 08/22/2011 0300   PROT 6.0 08/11/2011 0349   ALBUMIN 2.4* 08/11/2011 0349   AST 29 08/11/2011 0349   ALT 17 08/11/2011 0349   ALKPHOS 95 08/11/2011 0349   BILITOT 0.5 08/11/2011 0349   GFRNONAA >90 08/22/2011 0300   GFRAA >90 08/22/2011 0300     Intake/Output Summary (Last 24 hours) at 08/22/11 1233 Last data filed at 08/22/11 1610  Gross per 24 hour  Intake   1160 ml  Output   1370 ml  Net   -210 ml    Weight Status:   Admission wt: 206 lbs Current wt:   196 lbs  Nutrition Dx:  Inadequate oral intake, ongoing  Intervention:   1.  Enteral nutrition; discussed transitioning pt to Jevity 1.2 with NP who agrees.  Pt to start Jevity 1.2 @ 20 mL/hr via NGT.  Advance q 4 hrs to 70 mL/hr goal to provide 2016 kcal, 93 g protein, 1377 mL free water.  Monitor:  1.  Enteral nutrition; transition to maintenance TF with tolerance.  Pt able to advance to goal.    Hoyt Koch Pager #:   [phone]

## 2011-08-22 NOTE — Progress Notes (Signed)
Speech Language/Pathology Speech Pathology  PMSV (Passy-Muir Speaking Valve) Evaluation  I. HPI: 55 year old female re-admitted following elbow surgery when found minimally responsive at home after taking pain meds. Patient with N/V and aspiration of emesis is suspected, resulting in aspiration pneumonia.  Patient was intubated 11/25-11/28, self-extubated, emergently reintubated 11/28-12/6, extubation not tolerated and patient again emergently re-intubated 12/6.  Trach placed 12/7. . II. Tracheostomy Tube  Initial trach Placement date: 12/7  Trach collar period: 1 day Type: Shiley    Size: 8     Cuff: YES (deflated currently) Fenestration:   NO Secretion description: Thick, yellow, copious Level of secretion expectoration: Coughs from trach  II. Ventilator Dependency   NO (since 12/9)  IV. Cuff Deflation Trials  cuff deflated most of the afternoon and tolerated well. V. Finger occlusion:  Patient was able to phonate minimally with finger occlusion.  Voice quality was poor. VI. Clinical Impression: Patient is a good candidate for PMSV trial.  Current level of secretions may be a barrier, as well as size of trach.  Janina Mayo was just placed 3 days ago, and patient not likely ready to downsize in next few days.   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    VII. Recommendations MD- Consider changing tracheostomy tube to a #6 when appropriate. 1. Patient to attempt PMSV 12/11. 2.  MBS after tolerance of PMV for at least 30-60 minutes for a meal.   Maryjo Rochester T

## 2011-08-23 LAB — CBC
HCT: 27.6 % — ABNORMAL LOW (ref 36.0–46.0)
Hemoglobin: 8.4 g/dL — ABNORMAL LOW (ref 12.0–15.0)
RBC: 3.63 MIL/uL — ABNORMAL LOW (ref 3.87–5.11)

## 2011-08-23 LAB — BASIC METABOLIC PANEL
Chloride: 101 mEq/L (ref 96–112)
GFR calc Af Amer: >90 mL/min (ref >90)
GFR calc non Af Amer: >90 mL/min (ref >90)
Potassium: 3.4 mEq/L — ABNORMAL LOW (ref 3.5–5.1)
Sodium: 139 mEq/L (ref 135–145)

## 2011-08-23 NOTE — Progress Notes (Signed)
Occupational Therapy Evaluation Patient Details Name: [name] MRN: [id number] DOB: [birth year] Today's Date: 08/23/2011 Time in: 9:02 am Time out: 9:42 am Eval II, TA  Problem List:  Patient Active Problem List  Diagnoses  . ANXIETY  . DEPRESSION  . ACUTE SINUSITIS, UNSPECIFIED  . SHOULDER PAIN, CHRONIC  . HIP PAIN, CHRONIC  . NECK PAIN, CHRONIC  . Radial head fracture, closed  . Aspiration pneumonia due to inhalation of vomitus  . Altered mental status  . Stridor    Past Medical History:  Past Medical History  Diagnosis Date  . Ovarian cyst   . Pneumonia     hx x4  . Cancer     precancerous cervix  . GERD (gastroesophageal reflux disease)   . Headache   . Hiatal hernia   . PONV (postoperative nausea and vomiting) 08/06/11  . Difficult intubation    Past Surgical History:  Past Surgical History  Procedure Date  . Cesarean section 85  . Knee surgery 14    rt arthroscopy  . Ovarian cyst removal 80  . Abdominal hysterectomy 86  . Orif elbow fracture 08/06/2011    Procedure: OPEN REDUCTION INTERNAL FIXATION (ORIF) ELBOW/OLECRANON FRACTURE;  Surgeon: Sharma Covert;  Location: [facility];  Service: Orthopedics;  Laterality: Left;  . Direct laryngoscopy 08/19/2011    Procedure: DIRECT LARYNGOSCOPY;  Surgeon: Antony Contras;  Location: [facility];  Service: ENT;  Laterality: N/A;  . Tracheostomy tube placement 08/19/2011    Procedure: TRACHEOSTOMY;  Surgeon: Antony Contras;  Location: [facility];  Service: ENT;  Laterality: N/A;  tracheoscopy    OT Assessment/Plan/Recommendation OT Assessment Clinical Impression Statement: patient will benefit from skilled OT services to increase her independence with ADL for next venue of care. OT Recommendation/Assessment: Patient will need skilled OT in the acute care venue OT Problem List: Decreased strength;Decreased range of motion;Decreased activity tolerance;Decreased knowledge of use of DME or AE;Pain OT Therapy Diagnosis :  Generalized weakness;Acute pain OT Plan OT Frequency: Min 1X/week OT Treatment/Interventions: Self-care/ADL training;Therapeutic activities;DME and/or AE instruction;Patient/family education OT Recommendation Follow Up Recommendations: Skilled nursing facility Equipment Recommended: Defer to next venue Individuals Consulted Consulted and Agree with Results and Recommendations: Patient OT Goals Acute Rehab OT Goals OT Goal Formulation: With patient Time For Goal Achievement: 2 weeks ADL Goals Pt Will Perform Grooming: with supervision;Sitting, edge of bed;Unsupported ADL Goal: Grooming - Progress: Progressing toward goals Pt Will Perform Upper Body Bathing: with min assist;Unsupported;Sitting, chair;Sitting, edge of bed ADL Goal: Upper Body Bathing - Progress: Progressing toward goals Pt Will Perform Lower Body Bathing: with mod assist;with adaptive equipment;Sit to stand from chair;Sit to stand from bed ADL Goal: Lower Body Bathing - Progress: Progressing toward goals Pt Will Perform Upper Body Dressing: with min assist;Sitting, chair;Sitting, bed;Unsupported ADL Goal: Upper Body Dressing - Progress: Progressing toward goals Pt Will Perform Lower Body Dressing: with mod assist;with adaptive equipment;Sit to stand from bed;Sit to stand from chair ADL Goal: Lower Body Dressing - Progress: Progressing toward goals Pt Will Transfer to Toilet: with mod assist;Stand pivot transfer;3-in-1;with DME ADL Goal: Toilet Transfer - Progress: Progressing toward goals Pt Will Perform Toileting - Clothing Manipulation: with mod assist;Standing ADL Goal: Toileting - Clothing Manipulation - Progress: Progressing toward goals Pt Will Perform Toileting - Hygiene: with mod assist;Sit to stand from 3-in-1/toilet ADL Goal: Toileting - Hygiene - Progress: Progressing toward goals Additional ADL Goal #1: Pt will be able to indicate yes or no to a simple question related to her  ADL within a reasonable time frame  of less than or equal to 3 seconds.  ADL Goal: Additional Goal #1 - Progress: Progressing toward goals  OT Evaluation Precautions/Restrictions  Precautions Precautions: Fall Restrictions Weight Bearing Restrictions: No LUE Weight Bearing: Non weight bearing Other Position/Activity Restrictions: Informed nursing that patient would benefit from a sling for left upper extremity to support it especially with transfers/OOB. Also left note for MD. Prior Functioning Home Living Lives With: Significant other Type of Home: House Home Layout: Two level;1/2 bath on main level;Bed/bath upstairs Alternate Level Stairs-Rails: Can reach both;Left;Right (only half way up 2 rails) Alternate Level Stairs-Number of Steps: flight Home Access: Stairs to enter Entrance Stairs-Rails: None Entrance Stairs-Number of Steps: 2 Bathroom Shower/Tub: Engineer, manufacturing systems: Standard Home Adaptive Equipment: Walker - rolling;Wheelchair - manual Additional Comments: patient seemed to have difficulty recalling any bathroom equipment she may own. She thinks about it for an increased period of time and indicated she is not sure. Prior Function Level of Independence: Independent with homemaking with ambulation;Independent with basic ADLs;Independent with gait Driving: Yes ADL ADL Grooming: Simulated;Wash/dry face;Moderate assistance Where Assessed - Grooming: Supine, head of bed up Upper Body Bathing: Simulated;Maximal assistance Upper Body Bathing Details (indicate cue type and reason): pt with significant pain in left elbow and unable to use extremity to help with bathing and having to support self in sitting EOB with right upper extremity on rail. Requires assist for balance to let go of rail and use right upper extremity. Where Assessed - Upper Body Bathing: Sitting, bed;Unsupported Lower Body Bathing: +2 Total assistance;Simulated;Comment for patient %;Other (comment) (30%) Where Assessed - Lower Body  Bathing: Sit to stand from bed Upper Body Dressing: Simulated;+1 Total assistance Where Assessed - Upper Body Dressing: Sitting, bed;Unsupported Lower Body Dressing: +1 Total assistance Lower Body Dressing Details (indicate cue type and reason): with socks Where Assessed - Lower Body Dressing: Sitting, bed;Unsupported Toilet Transfer: +2 Total assistance;Comment for patient %;Other (comment) (less than 20%) Toilet Transfer Details (indicate cue type and reason): stand pivot to chair. Attempted to stand with RW but unable to support self in standing with RW. Very weak in legs. Toilet Transfer Method: Stand pivot Toileting - Clothing Manipulation: +2 Total assistance;Comment for patient %;Other (comment) (0) Where Assessed - Toileting Clothing Manipulation: Standing Toileting - Hygiene: +2 Total assistance;Comment for patient %;Other (comment) (0) Where Assessed - Toileting Hygiene: Standing Tub/Shower Transfer: Not assessed Tub/Shower Transfer Method: Not assessed Vision/Perception  Vision - History Baseline Vision: No visual deficits Cognition Cognition Arousal/Alertness: Awake/alert Overall Cognitive Status: Difficult to assess (had difficulty answering some questions. Increased time.) Sensation/Coordination Sensation Light Touch: Appears Intact Extremity Assessment RUE Assessment RUE Assessment: Exceptions to [facility] RUE Strength RUE Overall Strength Comments: full range of motion throughout  but grossly 3+/5 strength throughout. Pt states some tenderness with resistance applied. LUE Assessment LUE Assessment: Exceptions to [facility] LUE Strength LUE Overall Strength Comments: pt with recent elbow surgery. Wrapped up but not in a sling. Requested a sling. Very  painful to move UE and note edema in fingers. Encouraged her to move digits frequently. Didnt have her try to raise UE due to significant pain with minimal movements.  Mobility  Bed Mobility Supine to Sit: 1: +2 Total  assist Supine to Sit Details (indicate cue type and reason): pt=40%, increased time needed and cues/assist for participation. Transfers Sit to Stand: 1: +2 Total assist;Patient percentage (comment);Other (comment);From bed (less than 20%) Sit to Stand Details (indicate cue type and reason):  pt <20% and unable to accept weight on LE's even with +2 assist and walker.   Exercises   End of Session OT - End of Session Equipment Utilized During Treatment: Gait belt;Other (comment) (attempted RW use) Activity Tolerance: Patient limited by pain Patient left: in chair;with call bell in reach Nurse Communication: Mobility status for transfers General Behavior During Session: So [facility] for tasks performed Cognition:  (has some difficulty answering some questions.)   Lennox Laity [phone] 08/23/2011, 12:03 PM

## 2011-08-23 NOTE — Progress Notes (Signed)
Speech Language/Pathology RN reports that Dr. Jenne Pane, ENT stated that patient's trach will be downsized 08/24/11 and requested that PMV be deferred for now.    SLP will return after trach has been downsized.  Thanks, Maryjo Rochester T

## 2011-08-23 NOTE — Progress Notes (Signed)
CSW continues to follow for support and d/c needs. Pt may have benefits for LTAC as opposed to SNF. Pt does not have medicare and may not have SNF benefits with her insurance policy. Pt working with PT and OT both times CSW attempted to meet with Pt. CSW will discuss case with RN CM and continue to assist with d/c needs.   Vennie Homans, [place] 08/23/2011 12:37 PM [phone]

## 2011-08-23 NOTE — Progress Notes (Signed)
Physical Therapy Evaluation Patient Details Name: [name] MRN: [id number] DOB: 20-Mar-1956 Today's Date: 08/23/2011 [id number] EV2  Dischage Recommendation: Patient will need SNF level rehab at discharge  Problem List:  Patient Active Problem List  Diagnoses  . ANXIETY  . DEPRESSION  . ACUTE SINUSITIS, UNSPECIFIED  . SHOULDER PAIN, CHRONIC  . HIP PAIN, CHRONIC  . NECK PAIN, CHRONIC  . Radial head fracture, closed  . Aspiration pneumonia due to inhalation of vomitus  . Altered mental status  . Stridor    Past Medical History:  Past Medical History  Diagnosis Date  . Ovarian cyst   . Pneumonia     hx x4  . Cancer     precancerous cervix  . GERD (gastroesophageal reflux disease)   . Headache   . Hiatal hernia   . PONV (postoperative nausea and vomiting) 08/06/11  . Difficult intubation    Past Surgical History:  Past Surgical History  Procedure Date  . Cesarean section 85  . Knee surgery 14    rt arthroscopy  . Ovarian cyst removal 80  . Abdominal hysterectomy 86  . Orif elbow fracture 08/06/2011    Procedure: OPEN REDUCTION INTERNAL FIXATION (ORIF) ELBOW/OLECRANON FRACTURE;  Surgeon: Sharma Covert;  Location: [facility];  Service: Orthopedics;  Laterality: Left;  . Direct laryngoscopy 08/19/2011    Procedure: DIRECT LARYNGOSCOPY;  Surgeon: Antony Contras;  Location: [facility];  Service: ENT;  Laterality: N/A;  . Tracheostomy tube placement 08/19/2011    Procedure: TRACHEOSTOMY;  Surgeon: Antony Contras;  Location: [facility];  Service: ENT;  Laterality: N/A;  tracheoscopy    PT Assessment/Plan/Recommendation PT Assessment Clinical Impression Statement: Patient presents with aspiration pneumonia after elbow surgery and prolonged ventillation with trach.  Now presents with profound weakness and decreased activity tolerance limiting independence and safety with mobility and she will benefit from skilled PT in the acute setting to maximize independence and allow d/c home  after rehab stay PT Recommendation/Assessment: Patient will need skilled PT in the acute care venue PT Problem List: Decreased strength;Decreased activity tolerance;Decreased balance;Decreased mobility;Pain;Decreased safety awareness Barriers to Discharge: Decreased caregiver support Barriers to Discharge Comments: fiance works PT Therapy Diagnosis : Generalized weakness PT Plan PT Frequency: Min 3X/week PT Treatment/Interventions: Functional mobility training;Therapeutic activities;Therapeutic exercise;Balance training;Patient/family education;Gait training PT Recommendation Follow Up Recommendations: Skilled nursing facility Equipment Recommended: Defer to next venue PT Goals  Acute Rehab PT Goals PT Goal Formulation: With patient Time For Goal Achievement: 2 weeks Pt will go Supine/Side to Sit: with min assist PT Goal: Supine/Side to Sit - Progress: Progressing toward goal Pt will Sit at [facility] of Bed: with supervision;3-5 min;with unilateral upper extremity support (performing simple functional tast) PT Goal: Sit at Edge Of Bed - Progress: Progressing toward goal Pt will go Sit to Stand: with mod assist PT Goal: Sit to Stand - Progress: Progressing toward goal Pt will Transfer Bed to Chair/Chair to Bed: with mod assist PT Transfer Goal: Bed to Chair/Chair to Bed - Progress: Progressing toward goal Pt will Stand: with mod assist;with bilateral upper extremity support;1 - 2 min (in prep for ambulation) PT Goal: Stand - Progress: Progressing toward goal  PT Evaluation Precautions/Restrictions  Precautions Precautions: Fall Restrictions LUE Weight Bearing: Non weight bearing Prior Functioning  Home Living Lives With: Significant other Type of Home: House Home Layout: Two level;1/2 bath on main level;Bed/bath upstairs Alternate Level Stairs-Rails: Can reach both;Left;Right (only half way up 2 rails) Alternate Level Stairs-Number of Steps: flight Home Access:  Stairs to  enter Entrance Stairs-Rails: None Entrance Stairs-Number of Steps: 2 Bathroom Shower/Tub: Engineer, manufacturing systems: Standard Home Adaptive Equipment: Walker - rolling;Wheelchair - manual Prior Function Level of Independence: Independent with homemaking with ambulation;Independent with basic ADLs;Independent with gait Driving: Yes Cognition Cognition Arousal/Alertness: Awake/alert Overall Cognitive Status: Difficult to assess (had difficulty answering some questions. Increased time.) Sensation/Coordination Sensation Light Touch: Appears Intact Extremity Assessment RLE Assessment RLE Assessment: Exceptions to [facility] RLE AROM (degrees) Overall AROM Right Lower Extremity: Within functional limits for tasks assessed RLE Strength RLE Overall Strength: Deficits RLE Overall Strength Comments: knee extension 4-/5 c/o pain with testing and reports h/o arthroscopy last Jan. LLE Assessment LLE Assessment: Exceptions to W[facility] LLE AROM (degrees) Overall AROM Left Lower Extremity: Within functional limits for tasks assessed LLE Strength LLE Overall Strength: Deficits LLE Overall Strength Comments: 4/5 grossly tested (noted cam boot in window in room, patient indicated hurt her leg when fell and broke elbow, no noted injury in chart or weight bearing, pt not able to state when/if needs boot.) Mobility (including Balance) Bed Mobility Supine to Sit: 1: +2 Total assist Supine to Sit Details (indicate cue type and reason): pt=40%, increased time needed and cues/assist for participation. Transfers Sit to Stand: 1: +2 Total assist;Patient percentage (comment);Other (comment);From bed (less than 20%) Sit to Stand Details (indicate cue type and reason): pt <20% and unable to accept weight on LE's even with +2 assist and walker.   Squat Pivot Transfers: 1: +2 Total assist Squat Pivot Transfer Details (indicate cue type and reason): pt<20%, assisted with lifting patient on pad to  chair Ambulation/Gait Ambulation/Gait: No (unable at this time due to severe weakness)  Static Sitting Balance Static Sitting - Balance Support: Bilateral upper extremity supported Static Sitting - Level of Assistance: 4: Min assist Static Sitting - Comment/# of Minutes: sat EOB approx 4 minute with initial c/o dizziness and lob posterior, improved while sitting to minguard assist Exercise    End of Session PT - End of Session Equipment Utilized During Treatment: Gait belt Activity Tolerance: Patient limited by fatigue Patient left: in chair Nurse Communication: Need for lift equipment General Behavior During Session: [facility] for tasks performed Cognition: Impaired (limited assessment with trach collar, difficulty w/ hx)  WYNN,CYNDI 08/23/2011, 1:09 PM

## 2011-08-23 NOTE — Progress Notes (Signed)
Ask to evaluate red port of R upper arm PICC, stated that port was clotted. Attempted to flush, small amount of resistance. Flushed well with NS 10 ml, good blood return. RN aware. Stacie Glaze [place]

## 2011-08-23 NOTE — Progress Notes (Signed)
Critical Care Medicine Note  Patient Details:    89 yowf s/p elbow surgery 11/24 > discharged home with pain medications post operatively. Noted at home to be minimally responsive after sleeping/pain pill combo.  Adm 11/25 with aspiration pneumonia, AMS.   Lines/tubes 11/25 OETT>>>11/28 11/28 (reintubation)>>>12/6 12/6 urgent reintubation stridor cw tracheal stenosis>> 11/26 right IJ CVL>>>12/5 12/6 R PICC>>> 12/6 ENT Trach>>>  Microbiology/Sepsis markers: 11/26 MRSA : neg 11/26 BCX2>>>neg 11/26 sputum: moderate Klebsiella oxytoca (pan sens, but resistance to Unasyn) 12/5 BAL>>>nml flora (prelim) 12/6 BAL>> negative 12/6 resp culture>>>few GNR>>> 12/5 CVL tip Cx>>>NGTD>> 12/6 PCT>>>0.53 12/6 Lactic acid>>>1.1  Anti-infectives:  unasyn 11/26 (Asp)>>>11/28 clinda 11/26 (asp)>>> 11/27 11/28 rocephin (K oxytoca)>>>12/6 12/5 Vanc (line sepsis)>>> 12/5 Primaxin (VAP)>>>  Studies/Events: 11/27 bilat ue doppler neg 12/3 agitated with WUA 12/4 bradys down with weaning attempts, tolerated Ps 5/5 12/5 reattempts weaning>>failed, agitation 12/6 extubated/reintubated, plan for trach 12/6 CT neck>> tracheal narrowing, small bilateral pleural effusions L>R, bronch 12/7 to OR for ENT Trach  Subjective:    Overnight Issues: looks good on t-collar, working on PMV   Objective:  Vital signs for last 24 hours: Temp:  [99.1 F (37.3 C)-100.4 F (38 C)] 100.2 F (37.9 C) (12/11 0800) Pulse Rate:  [85-107] 99  (12/11 0911) Resp:  [14-28] 22  (12/11 0911) BP: (109-139)/(44-68) 109/61 mmHg (12/11 0911) SpO2:  [93 %-98 %] 98 % (12/11 0911) FiO2 (%):  [27 %-40 %] 40 % (12/11 0911) Weight:  [195 lb 15.8 oz (88.9 kg)] 195 lb 15.8 oz (88.9 kg) (12/11 0400)    Intake/Output this shift:  Intake/Output Summary (Last 24 hours) at 08/23/11 1059 Last data filed at 08/23/11 0900  Gross per 24 hour  Intake   1670 ml  Output   1941 ml  Net   -271 ml     Physical Exam:   General:NAD Neuro: intact HEENT/Neck: trach with cuff down Resp: coarse bilat CVS: Tachy, regular, no m/r/g GI: soft, nontender, BS WNL, no r/g Skin: no rash Extremities: Wrapped left arm, L>R LE edema with pitting Lt arm wrap taken down. Sutures clean and dry, wound ok. Re wrapped   PCXR:  08/21/2011  *RADIOLOGY REPORT*  Clinical Data: Respiratory difficulty  PORTABLE CHEST - 1 VIEW  Comparison: Yesterday  Findings: Stable tubular devices and basilar atelectasis left greater than right.  Small left effusion is worse.  IMPRESSION: Small left effusion.  Original Report Authenticated By: Donavan Burnet, M.D.   Labs  Lab 08/23/11 0415 08/22/11 0300 08/21/11 1213  NA 139 136 137  K 3.4* 3.3* 3.1*  CL 101 100 101  CO2 30 28 30   BUN 12 14 16   CREATININE 0.60 0.57 0.64  GLUCOSE 119* 114* 137*    Lab 08/23/11 0415 08/21/11 0500 08/20/11 0500  HGB 8.4* 8.4* 8.6*  HCT 27.6* 27.0* 27.8*  WBC 6.5 6.9 8.8  PLT 461* 433* 420*     Assessment/plan:  Acute Respiratory failure with ALI and ARDS. S/P c/w aspiration event. She self extubated 11/28, and re-intubated with-in an hour. Extubated 12/6, reintubated within an hour, again. Her CXR has improved a little from 12/5.  I/O remain neg. CVP 14. Bedside Trach 12/7 (ENT) Plan: -trach placement 12/7, tolerated ATC after all day 12/8 then TC 24 hours 12/9 -Continue bronchodilators -try to keep neg   -cont Lasix  Aspiration pneumonia/ Line sepsis -+ Klebsiella oxitoca, (intermediate sensitive to Unasyn). Rocephin started on 11/28. CVL pulled 12/5 (Cx). PICC placed 12/6.  -Added  Vanc/ primaxin 12/5, plan 8 days -Rocephin x 8 days/ dc 12/6 -follow CXR  AMS/encephalopathy, improved Plan:  - Decrease Seroquel 100mg  bid  12/9 - d/c clonadine 12/9 - home cymbalta 12/10 - resolving  Hypernatremia  Lab 08/23/11 0415 08/22/11 0300 08/21/11 1213  NA 139 136 137  Plan: -d/c extra water 11/30 -d/c d5w -Lasix 40 bid  Hypokalemia  Lab  08/23/11 0415 08/22/11 0300 08/21/11 1213 08/21/11 0500 08/20/11 0500  K 3.4* 3.3* 3.1* 2.8* 2.8*   -replenish 12/9, received KCL today -add scheduled K since on lasix -f/u bmp in am  Anemia: no evidence of bleeding  Lab 08/23/11 0415 08/21/11 0500 08/20/11 0500  HGB 8.4* 8.4* 8.6*    Plan: -transfuse for hemoglobin less than 7 -Continue proton pump inhibitor  Elbow surgery  - Dr Melvyn Novas following -wound   Malnutrition -TF restarted -speech eval ordered 12/10 for cuff down. Redo 12/11  Dispo:  - ? To floor 12/11 or 12/12    LOS: 16 days    MINOR,WILLIAM S  Attending Addendum:  I have seen the patient, discussed the issues, test results and plans with S. Minor. I agree with the Assessment and Plans as outlined above.   Levy Pupa, MD, PhD 08/23/2011, 11:51 AM [facility] [phone] or if no answer [phone]

## 2011-08-24 LAB — CULTURE, BLOOD (ROUTINE X 2)
Culture  Setup Time: 201212061441
Culture  Setup Time: 201212061441
Culture: NO GROWTH

## 2011-08-24 LAB — CBC
HCT: 26.9 % — ABNORMAL LOW (ref 36.0–46.0)
HCT: 28.6 % — ABNORMAL LOW (ref 36.0–46.0)
Hemoglobin: 9.1 g/dL — ABNORMAL LOW (ref 12.0–15.0)
MCH: 23.6 pg — ABNORMAL LOW (ref 26.0–34.0)
MCHC: 30.8 g/dL (ref 30.0–36.0)
MCV: 85.6 fL (ref 78.0–100.0)
MCV: 76.7 fL — ABNORMAL LOW (ref 78.0–100.0)
RBC: 3.13 MIL/uL — ABNORMAL LOW (ref 3.87–5.11)
RDW: 19.4 % — ABNORMAL HIGH (ref 11.5–15.5)
WBC: 9.1 10*3/uL (ref 4.0–10.5)
WBC: 7.4 10*3/uL (ref 4.0–10.5)

## 2011-08-24 LAB — BASIC METABOLIC PANEL
BUN: 12 mg/dL (ref 6–23)
CO2: 29 mEq/L (ref 19–32)
Chloride: 101 mEq/L (ref 96–112)
Creatinine, Ser: 0.5 mg/dL (ref 0.50–1.10)
Glucose, Bld: 133 mg/dL — ABNORMAL HIGH (ref 70–99)

## 2011-08-24 MED ORDER — PANTOPRAZOLE SODIUM 40 MG PO TBEC
40.0000 mg | DELAYED_RELEASE_TABLET | Freq: Every day | ORAL | Status: DC
  Administered 2011-08-25 – 2011-08-30 (×6): 40 mg via ORAL
  Filled 2011-08-24 (×7): qty 1

## 2011-08-24 MED ORDER — FUROSEMIDE 40 MG PO TABS
40.0000 mg | ORAL_TABLET | Freq: Two times a day (BID) | ORAL | Status: DC
  Administered 2011-08-24 – 2011-08-25 (×2): 40 mg via ORAL
  Filled 2011-08-24 (×3): qty 1

## 2011-08-24 MED ORDER — POTASSIUM CHLORIDE CRYS ER 20 MEQ PO TBCR
20.0000 meq | EXTENDED_RELEASE_TABLET | Freq: Once | ORAL | Status: AC
  Administered 2011-08-24: 20 meq via ORAL
  Filled 2011-08-24: qty 1

## 2011-08-24 NOTE — Progress Notes (Signed)
Critical Care Medicine Note  Patient Details:    59 yowf s/p elbow surgery 11/24 > discharged home with pain medications post operatively. Noted at home to be minimally responsive after sleeping/pain pill combo.  Adm 11/25 with aspiration pneumonia, AMS.   Lines/tubes 11/25 OETT>>>11/28 11/28 (reintubation)>>>12/6 12/6 urgent reintubation stridor cw tracheal stenosis>> 11/26 right IJ CVL>>>12/5 12/6 R PICC>>> 12/6 ENT Trach>>>  Microbiology/Sepsis markers: 11/26 MRSA : neg 11/26 BCX2>>>neg 11/26 sputum: moderate Klebsiella oxytoca (pan sens, but resistance to Unasyn) 12/5 BAL>>>nml flora 12/6 BAL>> negative 12/6 resp culture>>>few GNR>>> 12/5 CVL tip Cx>>>NGTD>> 12/6 PCT>>>0.53 12/6 Lactic acid>>>1.1  Anti-infectives:  unasyn 11/26 (Asp)>>>11/28 clinda 11/26 (asp)>>> 11/27 11/28 rocephin (K oxytoca)>>>12/6 12/5 Vanc (line sepsis)>>>12/12 12/5 Primaxin (VAP)>>>  Studies/Events: 11/27 bilat ue doppler neg 12/3 agitated with WUA 12/4 bradys down with weaning attempts, tolerated Ps 5/5 12/5 reattempts weaning>>failed, agitation 12/6 extubated/reintubated, plan for trach 12/6 CT neck>> tracheal narrowing, small bilateral pleural effusions L>R, bronch 12/7 to OR for ENT Trach  Subjective:    Overnight Issues: looks good on t-collar, working on PMV  Brief vt episode  Objective:  Vital signs for last 24 hours: Temp:  [99.5 F (37.5 C)-100.6 F (38.1 C)] 100.4 F (38 C) (12/12 0800) Pulse Rate:  [93-108] 95  (12/12 0518) Resp:  [19-27] 20  (12/12 0518) BP: (109-137)/(58-75) 132/67 mmHg (12/12 0400) SpO2:  [96 %-99 %] 97 % (12/12 0518) FiO2 (%):  [28 %-40 %] 28 % (12/12 0518) Weight:  [191 lb 12.8 oz (87 kg)] 191 lb 12.8 oz (87 kg) (12/12 0500)    Intake/Output this shift:  Intake/Output Summary (Last 24 hours) at 08/24/11 0837 Last data filed at 08/24/11 0700  Gross per 24 hour  Intake   2590 ml  Output   2250 ml  Net    340 ml     Physical Exam:    General:NAD Neuro: intact HEENT/Neck: trach with cuff down Resp: coarse bilat CVS: Tachy, regular, no m/r/g GI: soft, nontender, BS WNL, no r/g Skin: no rash Extremities: Wrapped left arm, L>R LE edema with pitting Lt arm wrap taken down. Sutures clean and dry, wound ok. Re wrapped   PCXR:  08/21/2011  *RADIOLOGY REPORT*  Clinical Data: Respiratory difficulty  PORTABLE CHEST - 1 VIEW  Comparison: Yesterday  Findings: Stable tubular devices and basilar atelectasis left greater than right.  Small left effusion is worse.  IMPRESSION: Small left effusion.  Original Report Authenticated By: Donavan Burnet, M.D.   Labs  Lab 08/24/11 0625 08/23/11 0415 08/22/11 0300  NA 136 139 136  K 3.5 3.4* 3.3*  CL 101 101 100  CO2 29 30 28   BUN 12 12 14   CREATININE 0.50 0.60 0.57  GLUCOSE 133* 119* 114*    Lab 08/24/11 0625 08/24/11 0445 08/23/11 0415  HGB 8.8* 9.1* 8.4*  HCT 28.6* 26.9* 27.6*  WBC 7.4 9.1 6.5  PLT 466* 160 461*     Assessment/plan:  Acute Respiratory failure with ALI and ARDS. S/P c/w aspiration event. She self extubated 11/28, and re-intubated with-in an hour. Extubated 12/6, reintubated within an hour, again. Her CXR has improved a little from 12/5.  I/O remain neg. CVP 14. Bedside Trach 12/7 (ENT) Plan: -trach placement 12/7, tolerated ATC after all day 12/8 then TC 24 hours 12/9 -Continue bronchodilators -try to keep neg   -cont Lasix -downsize trach 12/12  Aspiration pneumonia/ Line sepsis -+ Klebsiella oxitoca, (intermediate sensitive to Unasyn). Rocephin started on 11/28. CVL pulled 12/5 (  Cx). PICC placed 12/6.  -Added Vanc/ primaxin 12/5, plan 8 days dc vanc 12/12 -Rocephin x 8 days/ dc 12/6 -follow CXR  AMS/encephalopathy, improved Plan:  - Decrease Seroquel 100mg  bid  12/9 - d/c clonadine 12/9 - home cymbalta 12/10 - resolving  Hypernatremia  Lab 08/24/11 0625 08/23/11 0415 08/22/11 0300  NA 136 139 136  Plan: -d/c extra water 11/30 -d/c  d5w -Lasix 40 bid  Hypokalemia  Lab 08/24/11 0625 08/23/11 0415 08/22/11 0300 08/21/11 1213 08/21/11 0500  K 3.5 3.4* 3.3* 3.1* 2.8*   -replenish 12/9, received KCL today -add scheduled K since on lasix -f/u bmp in am  Anemia: no evidence of bleeding  Lab 08/24/11 0625 08/24/11 0445 08/23/11 0415  HGB 8.8* 9.1* 8.4*    Plan: -transfuse for hemoglobin less than 7 -Continue proton pump inhibitor  Elbow surgery  - Dr Melvyn Novas following -wound   Malnutrition -TF restarted -speech eval ordered 12/10 for cuff down. Redo 12/11  Dispo:  - ? To floor 12/11 or 12/12 after trach downsize    LOS: 17 days    Brett Canales Minor ACNP [facility] PCCM Pager [phone] till 3 pm If no answer page [phone] 08/24/2011, 8:38 AM  Levy Pupa, MD, PhD 08/24/2011, 4:06 PM [facility] [phone] or if no answer [phone]

## 2011-08-24 NOTE — Progress Notes (Signed)
Speech Language/Pathology Clinical/Bedside Swallow Evaluation Patient Details  Name: [name] MRN: [id number] DOB: 10-Jul-1956 Today's Date: 08/24/2011  Past Medical History:  Past Medical History  Diagnosis Date  . Ovarian cyst   . Pneumonia     hx x4  . Cancer     precancerous cervix  . GERD (gastroesophageal reflux disease)   . Headache   . Hiatal hernia   . PONV (postoperative nausea and vomiting) 08/06/11  . Difficult intubation    Past Surgical History:  Past Surgical History  Procedure Date  . Cesarean section 87  . Knee surgery 14    rt arthroscopy  . Ovarian cyst removal 80  . Abdominal hysterectomy 88  . Orif elbow fracture 08/06/2011    Procedure: OPEN REDUCTION INTERNAL FIXATION (ORIF) ELBOW/OLECRANON FRACTURE;  Surgeon: Sharma Covert;  Location: [facility] OR;  Service: Orthopedics;  Laterality: Left;  . Direct laryngoscopy 08/19/2011    Procedure: DIRECT LARYNGOSCOPY;  Surgeon: Antony Contras;  Location: [facility];  Service: ENT;  Laterality: N/A;  . Tracheostomy tube placement 08/19/2011    Procedure: TRACHEOSTOMY;  Surgeon: Antony Contras;  Location: [facility];  Service: ENT;  Laterality: N/A;  tracheoscopy    Assessment/Recommendations/Treatment Plan    SLP Assessment Clinical Impression Statement: Patient swallowed without overt s/s of aspiration, however, did swallow 2-3 times with bite of puree.  Due to patient's complicated medical course, with several traumatic intubations and relatively recent trach placement, an objective study is recommended prior to initiating a diet.  Paitent could be allowed sips and chips after through oral care, and possibly meds in applesauce (if approved by MD), and  hold  replacing her clogged Panda tube pending results of MBS 12/13.  RN to f/u with MD. Risk for Aspiration: Mild Other Related Risk Factors: History of pneumonia;Tracheostomy  Recommendations Recommended Consults: MBS General Recommendation: NPO except meds;Ice  chips PRN after oral care;Free water protocol after oral care Medication Administration: Whole meds with puree Compensations: Small sips/bites;Slow rate Postural Changes and/or Swallow Maneuvers: Seated upright 90 degrees Oral Care Recommendations: Staff/trained caregiver to provide oral care;Oral care before and after PO Other Recommendations:  (Defer treatment plan pending MBS.)  Prognosis Prognosis for Safe Diet Advancement: Good  Individuals Consulted Consulted and Agree with Results and Recommendations: Patient;MD;RN   Bedside Swallow Evaluation: Prior Functional Status     General  Other Pertinent Information: Patient had trach downsized this am to #6 cuffless Shiley.  PMSV trial just completed prior to this evaluation with excellent tolerance. PMSV in place for this eval.   RN reports Panda feeding tube is clogged and needs to be replaced. Type of Study: Bedside swallow evaluation Diet Prior to this Study: Panda;NPO Temperature Spikes Noted: No Respiratory Status: Trach Trach Size and Type: Uncuffed;#6;With PMSV in place Behavior/Cognition: Alert;Cooperative;Pleasant mood Oral Cavity - Dentition: Adequate natural dentition Patient Positioning: Upright in bed Baseline Vocal Quality: Normal Volitional Cough: Strong Volitional Swallow: Unable to elicit Ice chips: Tested (comment) (Appeared to tolerate.)  Oral Motor/Sensory Function  Labial ROM: Within Functional Limits Labial Symmetry: Within Functional Limits Labial Strength: Within Functional Limits Labial Sensation: Within Functional Limits Lingual ROM: Within Functional Limits Lingual Symmetry: Within Functional Limits Lingual Strength: Within Functional Limits Lingual Sensation: Within Functional Limits Facial ROM: Within Functional Limits Facial Symmetry: Within Functional Limits Facial Strength: Within Functional Limits Facial Sensation: Within Functional Limits Velum: Within Functional Limits Mandible:  Within Functional Limits  Consistency Results  Ice Chips Ice chips: Within functional limits  Thin  Liquid Thin Liquid: Within functional limits Presentation: Cup;Spoon;Straw        Puree Puree: Impaired Presentation: Spoon Other Comments: Patient swallowed 2-3 times with each bite, but denied sensation of food "sticking" in throat.  Patient did state, after initial swallow, "It feels weird," but was unable to elaborate.      Maryjo Rochester T 08/24/2011,6:13 PM

## 2011-08-24 NOTE — Progress Notes (Signed)
ANTIBIOTIC CONSULT NOTE - Follow-Up  Pharmacy Consult for Vancomycin, Primaxin Indication: VAP/line sepsis  Allergies  Allergen Reactions  . Meperidine Hcl Itching    REACTION: hives, itich    Patient Measurements: Height:  (5'4") Weight: 191 lb 12.8 oz (87 kg) IBW/kg (Calculated) : 47.8     Vital Signs: Temp: 99.9 F (37.7 C) (12/12 0400) Temp src: Core (Comment) (12/12 0400) BP: 132/67 mmHg (12/12 0400) Pulse Rate: 95  (12/12 0518) Intake/Output from previous day: 12/11 0701 - 12/12 0700 In: 2850 [I.V.:400; NG/GT:1650; IV Piggyback:800] Out: 2350 [Urine:2350] Intake/Output from this shift:    Labs:  Basename 08/24/11 0625 08/24/11 0445 08/23/11 0415 08/22/11 0300  WBC 7.4 9.1 6.5 --  HGB 8.8* 9.1* 8.4* --  PLT 466* 160 461* --  LABCREA -- -- -- --  CREATININE 0.50 -- 0.60 0.57   Estimated Creatinine Clearance: 81.5 ml/min (by C-G formula based on Cr of 0.5).  Basename 08/21/11 1650  VANCOTROUGH 27.1*  VANCOPEAK --  Drue Dun --  GENTTROUGH --  GENTPEAK --  GENTRANDOM --  TOBRATROUGH --  TOBRAPEAK --  TOBRARND --  AMIKACINPEAK --  AMIKACINTROU --  AMIKACIN --     Microbiology: Recent Results (from the past 720 hour(s))  SURGICAL PCR SCREEN     Status: Normal   Collection Time   08/05/11  1:51 PM      Component Value Range Status Comment   MRSA, PCR NEGATIVE  NEGATIVE  Final    Staphylococcus aureus NEGATIVE  NEGATIVE  Final   MRSA PCR SCREENING     Status: Normal   Collection Time   08/08/11  2:23 AM      Component Value Range Status Comment   MRSA by PCR NEGATIVE  NEGATIVE  Final   CULTURE, BLOOD (ROUTINE X 2)     Status: Normal   Collection Time   08/08/11  3:15 AM      Component Value Range Status Comment   Specimen Description BLOOD RIGHT ANTECUBITAL   Final    Special Requests BOTTLES DRAWN AEROBIC AND ANAEROBIC 5 CC EA   Final    Setup Time [id number]   Final    Culture NO GROWTH 5 DAYS   Final    Report Status 08/14/2011 FINAL    Final   CULTURE, BLOOD (ROUTINE X 2)     Status: Normal   Collection Time   08/08/11  3:20 AM      Component Value Range Status Comment   Specimen Description BLOOD RIGHT HAND   Final    Special Requests BOTTLES DRAWN AEROBIC AND ANAEROBIC 5 CC EA   Final    Setup Time [id number]   Final    Culture NO GROWTH 5 DAYS   Final    Report Status 08/14/2011 FINAL   Final   CULTURE, RESPIRATORY     Status: Normal   Collection Time   08/08/11  4:39 AM      Component Value Range Status Comment   Specimen Description ENDOTRACHEAL   Final    Special Requests NONE   Final    Gram Stain     Final    Value: MODERATE WBC PRESENT,BOTH PMN AND MONONUCLEAR     NO SQUAMOUS EPITHELIAL CELLS SEEN     RARE GRAM POSITIVE RODS   Culture MODERATE KLEBSIELLA OXYTOCA   Final    Report Status 08/10/2011 FINAL   Final    Organism ID, Bacteria KLEBSIELLA OXYTOCA   Final   CATH TIP CULTURE  Status: Normal   Collection Time   08/17/11  5:01 PM      Component Value Range Status Comment   Specimen Description CATH TIP   Final    Special Requests NONE   Final    Culture NO GROWTH 2 DAYS   Final    Report Status 08/20/2011 FINAL   Final   CULTURE, BAL-QUANTITATIVE     Status: Normal   Collection Time   08/17/11 11:30 PM      Component Value Range Status Comment   Specimen Description BRONCHIAL ALVEOLAR LAVAGE   Final    Special Requests NONE   Final    Gram Stain     Final    Value: FEW WBC PRESENT,BOTH PMN AND MONONUCLEAR     NO SQUAMOUS EPITHELIAL CELLS SEEN     NO ORGANISMS SEEN   Colony Count 400 COLONIES/ML   Final    Culture Non-Pathogenic Oropharyngeal-type Flora Isolated.   Final    Report Status 08/20/2011 FINAL   Final   URINE CULTURE     Status: Normal   Collection Time   08/18/11 10:09 AM      Component Value Range Status Comment   Specimen Description URINE, CATHETERIZED   Final    Special Requests Normal   Final    Setup Time [id number]   Final    Colony Count NO GROWTH   Final     Culture NO GROWTH   Final    Report Status 08/19/2011 FINAL   Final   CULTURE, RESPIRATORY     Status: Normal   Collection Time   08/18/11 10:20 AM      Component Value Range Status Comment   Specimen Description SPUTUM   Final    Special Requests NONE   Final    Gram Stain     Final    Value: ABUNDANT WBC NO SQUAMOUS EPITHELIAL CELLS SEEN     NO ORGANISMS SEEN   Culture FEW KLEBSIELLA OXYTOCA   Final    Report Status 08/20/2011 FINAL   Final    Organism ID, Bacteria KLEBSIELLA OXYTOCA   Final   CULTURE, BLOOD (ROUTINE X 2)     Status: Normal (Preliminary result)   Collection Time   08/18/11 10:55 AM      Component Value Range Status Comment   Specimen Description BLOOD LEFT HAND   Final    Special Requests BOTTLES DRAWN AEROBIC AND ANAEROBIC 2CC   Final    Setup Time [id number]   Final    Culture     Final    Value:        BLOOD CULTURE RECEIVED NO GROWTH TO DATE CULTURE WILL BE HELD FOR 5 DAYS BEFORE ISSUING A FINAL NEGATIVE REPORT   Report Status PENDING   Incomplete   CULTURE, BLOOD (ROUTINE X 2)     Status: Normal (Preliminary result)   Collection Time   08/18/11 11:15 AM      Component Value Range Status Comment   Specimen Description BLOOD LEFT HAND   Final    Special Requests BOTTLES DRAWN AEROBIC ONLY 2CC   Final    Setup Time [id number]   Final    Culture     Final    Value:        BLOOD CULTURE RECEIVED NO GROWTH TO DATE CULTURE WILL BE HELD FOR 5 DAYS BEFORE ISSUING A FINAL NEGATIVE REPORT   Report Status PENDING   Incomplete   CULTURE, BAL-QUANTITATIVE  Status: Normal   Collection Time   08/18/11  3:39 PM      Component Value Range Status Comment   Specimen Description BRONCHIAL ALVEOLAR LAVAGE   Final    Special Requests NONE   Final    Gram Stain     Final    Value: FEW WBC PRESENT,BOTH PMN AND MONONUCLEAR     NO SQUAMOUS EPITHELIAL CELLS SEEN     NO ORGANISMS SEEN   Colony Count NO GROWTH   Final    Culture NO GROWTH 2 DAYS   Final    Report Status  08/20/2011 FINAL   Final   CLOSTRIDIUM DIFFICILE BY PCR     Status: Normal   Collection Time   08/21/11  1:12 AM      Component Value Range Status Comment   C difficile by pcr NEGATIVE  NEGATIVE  Final     Medical History: Past Medical History  Diagnosis Date  . Ovarian cyst   . Pneumonia     hx x4  . Cancer     precancerous cervix  . GERD (gastroesophageal reflux disease)   . Headache   . Hiatal hernia   . PONV (postoperative nausea and vomiting) 08/06/11  . Difficult intubation     Medications:  Scheduled:     . antiseptic oral rinse  15 mL Mouth Rinse QID  . bacitracin   Topical BID  . chlorhexidine  15 mL Mouth Rinse BID  . DULoxetine  30 mg Oral Daily  . furosemide  40 mg Per Tube BID  . imipenem-cilastatin  500 mg Intravenous Q6H  . pantoprazole sodium  40 mg Per Tube Q1200  . potassium chloride  40 mEq Oral Daily  . QUEtiapine  100 mg Oral BID  . sodium chloride  10 mL Intracatheter Q12H  . vancomycin  1,000 mg Intravenous Q12H   Infusions:     . sodium chloride 20 mL/hr at 08/23/11 1900  . feeding supplement (JEVITY 1.2) 1,000 mL (08/24/11 0541)   Assessment: Day #8/ 8 Vancomycin 1g IV q8/Primaxin 500mg  IV q6 for HAP/line sepsis. Renal function has remained stable, WBC are within normal limits.  Goal of Therapy:  Vancomycin trough level 15-20 mcg/ml  Plan:  1. Continue current antibiotics, vancomycin and primaxin. 2. Consider d/c when pt condition allows.  Lynann Beaver PharmD  Pager [phone] 08/24/2011 8:14 AM

## 2011-08-24 NOTE — Progress Notes (Signed)
Speech Language/Pathology Speech Pathology  PMSV (Passy-Muir Speaking Valve) Evaluation    HPI:  See previous note Janina Mayo was downsized this am (approximately 07:30 per RN)   Tracheostomy Tube  Initial trach Placement date:  08/19/11   Type:  Shiley     Size: 6     Cuff:  No Secretion description: Yellow/white, minimally thick Frequency of tracheal suctioning:  Rarely, per RN. Level of secretion expectoration: Patient is able to cough secretions from trach easily.  Ventilator Dependency   NO      PMSV Trial PMSV was placed for 1 hour. Pt. Was able to redirect subglottic air through upper airway and was able to attain phonation with PMSV immediately, with excellent voice quality. Pt. Was able to expectorate secretions with PMSV into oral cavity and suctioned orally. Breath Support for phonation was adequate, and conversation was 100% intelligible. Baseline Vitals (prior to PMSV placement): RR: 23  SpO2:98 HR:103 Behavior: Relaxed, but excited to try the PMSV.  Vitals during this session with PMSV in place:   RR: varied from 18 to 30 SpO2 96-97%  HR:  99-104 bpm Behavior:  Conversing with SLP, RN, CNA; positive attitude, asking to eat and drink.   VI. Clinical Impression: Excellent session, with tolerance of PMV for over 1 hour, with all VSS.  Patient exhibits good respiratory support for speech, good voice quality, and articulation, with  Conversation 100% intelligible.  Patient is oriented X 4 and displayed good memory and appropriate behavior.  Good tolerance of PMSV.  Therapy Diagnosis: Trach dependent.  VII. Recommendations  1. Patient to use PMSV with intermittent supervision to monitor vital signs (SpO2 > 90%) for all waking hours. 2.  May use PMSV in therapies if pulse ox monitored. 3.  Remove PMSV prior to sleeping.  VIII. Treatment Plan   Frequency/Duration: 3 times per week Short Term Goals 1.  Patient will tolerate PMSV during all waking hours with VSS to converse  with staff and family. 2.  Patient will expectorate secretions orally with PMSV in place. 3.  Patient will demonstrate placement & removal of PMSV independently. 4.  Patient will complete Swallow evaluations Pain: Pt. Denied pain.  Maryjo Rochester T

## 2011-08-24 NOTE — Progress Notes (Signed)
5 Days Post-Op  Subjective: Off ventilator for a couple of days, on trach collar.  Objective: Vital signs in last 24 hours: Temp:  [99.5 F (37.5 C)-100.6 F (38.1 C)] 100.4 F (38 C) (12/12 0800) Pulse Rate:  [93-108] 95  (12/12 0518) Resp:  [19-27] 20  (12/12 0518) BP: (109-137)/(58-75) 132/67 mmHg (12/12 0400) SpO2:  [96 %-99 %] 97 % (12/12 0518) FiO2 (%):  [28 %-40 %] 28 % (12/12 0518) Weight:  [87 kg (191 lb 12.8 oz)] 191 lb 12.8 oz (87 kg) (12/12 0500) Last BM Date: 08/23/11  Intake/Output from previous day: 12/11 0701 - 12/12 0700 In: 2850 [I.V.:400; NG/GT:1650; IV Piggyback:800] Out: 2350 [Urine:2350] Intake/Output this shift:    General appearance: alert, cooperative and no distress Neck: Trach tube in position, wound fresh.  Lab Results:   [facility] 08/24/11 0625 08/24/11 0445  WBC 7.4 9.1  HGB 8.8* 9.1*  HCT 28.6* 26.9*  PLT 466* 160   BMET  Basename 08/24/11 0625 08/23/11 0415  NA 136 139  K 3.5 3.4*  CL 101 101  CO2 29 30  GLUCOSE 133* 119*  BUN 12 12  CREATININE 0.50 0.60  CALCIUM 8.8 8.9   PT/INR No results found for this basename: LABPROT:2,INR:2 in the last 72 hours ABG  Basename 08/21/11 1755  PHART 7.469*  HCO3 26.3*    Studies/Results: No results found.  Anti-infectives: Anti-infectives     Start     Dose/Rate Route Frequency Ordered Stop   08/21/11 2000   vancomycin (VANCOCIN) IVPB 1000 mg/200 mL premix        1,000 mg 200 mL/hr over 60 Minutes Intravenous Every 12 hours 08/21/11 1756     08/18/11 0000   imipenem-cilastatin (PRIMAXIN) 500 mg in sodium chloride 0.9 % 100 mL IVPB        500 mg 200 mL/hr over 30 Minutes Intravenous 4 times per day 08/17/11 2225     08/17/11 1800   vancomycin (VANCOCIN) IVPB 1000 mg/200 mL premix  Status:  Discontinued        1,000 mg 200 mL/hr over 60 Minutes Intravenous Every 8 hours 08/17/11 1707 08/21/11 1756   08/10/11 1200   cefTRIAXone (ROCEPHIN) 1 g in dextrose 5 % 50 mL IVPB  Status:   Discontinued        1 g 100 mL/hr over 30 Minutes Intravenous Every 24 hours 08/10/11 1119 08/17/11 2202   08/08/11 0300   Ampicillin-Sulbactam (UNASYN) 3 g in sodium chloride 0.9 % 100 mL IVPB  Status:  Discontinued        3 g 100 mL/hr over 60 Minutes Intravenous Every 6 hours 08/08/11 0234 08/10/11 1115   08/07/11 2330   clindamycin (CLEOCIN) IVPB 900 mg        900 mg 100 mL/hr over 30 Minutes Intravenous  Once 08/07/11 2241 08/08/11 0006          Assessment/Plan: s/p Procedure(s): DIRECT LARYNGOSCOPY TRACHEOSTOMY Trach tube changed easily to #6 cuffless Shiley tube.  Patient able to breathe and make voice around occluded tube.  Voice is deeper pitched.  Proceed with speech path eval with P-M valve and swallow.  Do not remove trach tube without my involvement.  I will need to look at larynx first.  LOS: 17 days    [name] D 08/24/2011

## 2011-08-25 ENCOUNTER — Inpatient Hospital Stay (HOSPITAL_COMMUNITY): Payer: No Typology Code available for payment source

## 2011-08-25 LAB — CBC
Hemoglobin: 9.3 g/dL — ABNORMAL LOW (ref 12.0–15.0)
MCV: 75.9 fL — ABNORMAL LOW (ref 78.0–100.0)
Platelets: 554 10*3/uL — ABNORMAL HIGH (ref 150–400)
RBC: 4.03 MIL/uL (ref 3.87–5.11)
WBC: 6.9 10*3/uL (ref 4.0–10.5)

## 2011-08-25 LAB — BASIC METABOLIC PANEL
CO2: 30 mEq/L (ref 19–32)
Chloride: 98 mEq/L (ref 96–112)
Creatinine, Ser: 0.6 mg/dL (ref 0.50–1.10)
Glucose, Bld: 93 mg/dL (ref 70–99)

## 2011-08-25 LAB — PHOSPHORUS: Phosphorus: 3.8 mg/dL (ref 2.3–4.6)

## 2011-08-25 LAB — MAGNESIUM: Magnesium: 2.6 mg/dL — ABNORMAL HIGH (ref 1.5–2.5)

## 2011-08-25 MED ORDER — FUROSEMIDE 40 MG PO TABS
40.0000 mg | ORAL_TABLET | Freq: Every day | ORAL | Status: DC
  Administered 2011-08-26 – 2011-08-31 (×6): 40 mg via ORAL
  Filled 2011-08-25 (×6): qty 1

## 2011-08-25 NOTE — Progress Notes (Signed)
CSW met with Pt at length yesterday to discuss coping with hospitalization and begin discussing possible plans for rehabilitation after this hospitalization. Pt very concerned about financial matters and her insurance. She stated it was "open season" and needed to complete paperwork that was at home. Patient repeatedly attempted to get out of the bed stating she had "to get home to take care of stuff". CSW calmed Pt and helped her problem solve the situation. Pt also is very concerned about Christmas and her 9yo son. CSW will follow closely and continue to assist.  Vennie Homans, LCSWA 08/25/2011 9:25 AM [phone]

## 2011-08-25 NOTE — Progress Notes (Signed)
Speech Language/Pathology S:  "I've got 3 adults at home who can take off work to help take care of me.  I want to go home today."  Patient is eager to get home for Christmas, and states, "My nurses have been sick, and I don't need to get what they have."  O&A:  PMSV placed and patient is tolerating use well.  Secretions appear to be lessened, and patient is able to expectorate orally with PMSV in place. Sats remained b/w 94-97%, RR b/w 18-28 during this session.  P:  MBS today @ 1100.  PMSV: wear all waking hours with VSS/  [name] T

## 2011-08-25 NOTE — Progress Notes (Signed)
Critical Care Medicine Note  Patient Details:    32 yowf s/p elbow surgery 11/24 > discharged home with pain medications post operatively. Noted at home to be minimally responsive after sleeping/pain pill combo.  Adm 11/25 with aspiration pneumonia, AMS.   Lines/tubes 11/25 OETT>>>11/28 11/28 (reintubation)>>>12/6 12/6 urgent reintubation stridor cw tracheal stenosis>> 11/26 right IJ CVL>>>12/5 12/6 R PICC>>> 12/6 ENT Trach>>>12/12 change to #6 cuffless  Microbiology/Sepsis markers: 11/26 MRSA : neg 11/26 BCX2>>>neg 11/26 sputum: moderate Klebsiella oxytoca (pan sens, but resistance to Unasyn) 12/5 BAL>>>nml flora 12/6 BAL>> negative 12/6 resp culture>>>few GNR>>> 12/5 CVL tip Cx>>>NGTD>> 12/6 PCT>>>0.53 12/6 Lactic acid>>>1.1  Anti-infectives:  unasyn 11/26 (Asp)>>>11/28 clinda 11/26 (asp)>>> 11/27 11/28 rocephin (K oxytoca)>>>12/6 12/5 Vanc (line sepsis)>>>12/12 12/5 Primaxin (VAP)>>>  Studies/Events: 11/27 bilat ue doppler neg 12/3 agitated with WUA 12/4 bradys down with weaning attempts, tolerated Ps 5/5 12/5 reattempts weaning>>failed, agitation 12/6 extubated/reintubated, plan for trach 12/6 CT neck>> tracheal narrowing, small bilateral pleural effusions L>R, bronch 12/7 to OR for ENT Trach  Subjective:    Overnight Issues: looks good on t-collar, working on PMV; trach changed to #6 cuffless    Objective:  Vital signs for last 24 hours: Temp:  [98.3 F (36.8 C)-100.2 F (37.9 C)] 98.9 F (37.2 C) (12/13 0800) Pulse Rate:  [90-106] 95  (12/13 0800) Resp:  [14-33] 25  (12/13 0800) BP: (108-132)/(28-68) 132/68 mmHg (12/13 0800) SpO2:  [91 %-98 %] 95 % (12/13 0800) FiO2 (%):  [28 %] 28 % (12/12 1200) Weight:  [186 lb 4.6 oz (84.5 kg)] 186 lb 4.6 oz (84.5 kg) (12/13 0449)    Intake/Output this shift:  Intake/Output Summary (Last 24 hours) at 08/25/11 1100 Last data filed at 08/25/11 1000  Gross per 24 hour  Intake    840 ml  Output   3040 ml  Net   -2200 ml     Physical Exam:  General:NAD Neuro: intact HEENT/Neck: trach with cap Resp: coarse bilat CVS: Tachy, regular, no m/r/g GI: soft, nontender, BS WNL, no r/g Skin: no rash Extremities: Wrapped left arm, L>R LE edema with pitting Lt arm wrap taken down. Sutures clean and dry, wound ok. Re wrapped   PCXR:  08/21/2011  *RADIOLOGY REPORT*  Clinical Data: Respiratory difficulty  PORTABLE CHEST - 1 VIEW  Comparison: Yesterday  Findings: Stable tubular devices and basilar atelectasis left greater than right.  Small left effusion is worse.  IMPRESSION: Small left effusion.  Original Report Authenticated By: Donavan Burnet, M.D.   Labs  Lab 08/25/11 0455 08/24/11 0625 08/23/11 0415  NA 136 136 139  K 3.8 3.5 3.4*  CL 98 101 101  CO2 30 29 30   BUN 12 12 12   CREATININE 0.60 0.50 0.60  GLUCOSE 93 133* 119*    Lab 08/25/11 0455 08/24/11 0625 08/24/11 0445  HGB 9.3* 8.8* 9.1*  HCT 30.6* 28.6* 26.9*  WBC 6.9 7.4 9.1  PLT 554* 466* 160     Assessment/plan:  Acute Respiratory failure with ALI and ARDS. S/P c/w aspiration event. She self extubated 11/28, and re-intubated with-in an hour. Extubated 12/6, reintubated within an hour, again. Her CXR has improved a little from 12/5.  I/O remain neg. CVP 14. Bedside Trach 12/7 (ENT); suspect large component of reintubation event due to UA obstruction/stridor.  Plan: -trach placement 12/7, tolerated ATC after all day 12/8 then TC 24 hours 12/9 -Continue bronchodilators -try to keep negative balance -cuffless trach 12/12 -12/13 tx to floor with tele, ent to  follow trach  Aspiration pneumonia/ Line sepsis -+ Klebsiella oxitoca, (intermediate sensitive to Unasyn). Rocephin started on 11/28. CVL pulled 12/5 (Cx). PICC placed 12/6.  -Added Vanc/ primaxin 12/5, plan 8 days dc vanc 12/12 -Rocephin x 8 days/ dc 12/6 -follow CXR  AMS/encephalopathy, improved Plan:  - Decrease Seroquel 100mg  bid  12/9 - d/c clonadine 12/9 - home cymbalta  12/10 - resolving  Hypernatremia  Lab 08/25/11 0455 08/24/11 0625 08/23/11 0415  NA 136 136 139  Plan: -d/c extra water 11/30 -d/c d5w -Lasix 40 bid  Hypokalemia  Lab 08/25/11 0455 08/24/11 0625 08/23/11 0415 08/22/11 0300 08/21/11 1213  K 3.8 3.5 3.4* 3.3* 3.1*   -replenish 12/9, received KCL today -add scheduled K since on lasix -f/u bmp in am  Anemia: no evidence of bleeding  Lab 08/25/11 0455 08/24/11 0625 08/24/11 0445  HGB 9.3* 8.8* 9.1*    Plan: -transfuse for hemoglobin less than 7 -Continue proton pump inhibitor  Elbow surgery  - Dr Melvyn Novas following -wound   Malnutrition -TF restarted -speech eval ordered 12/10 for cuff down. Redo 12/13  Dispo:  -  To floor 12/13   LOS: 18 days   Brett Canales Minor ACNP [facility] Pager [phone] till 3 pm If no answer page [phone] 08/25/2011, 11:00 AM  Attending Addendum:  I have seen the patient, discussed the issues, test results and plans with S. Minor. I agree with the Assessment and Plans as outlined above.   Levy Pupa, MD, PhD 08/25/2011, 12:35 PM [facility] [phone] or if no answer [phone]

## 2011-08-25 NOTE — Progress Notes (Signed)
Physical Therapy Treatment Patient Details Name: [name] MRN: [id number] DOB: 05-Apr-1956 Today's Date: 08/25/2011 1420-1510 3TA  PT Assessment/Plan  PT - Assessment/Plan Comments on Treatment Session: Patient fatigued due to no sleep last pm.  Reports she has no more therapy benefits for the year.  Seems determined to go home.  Daughter arrived at end of session and seemed concerned that she could not stand.  Likely could have assist at home, but really needs to be more mobile prior to going home.  Will ask for inpatient rehab consult. PT Plan: Discharge plan needs to be updated;Frequency needs to be updated PT Frequency: Min 4X/week Follow Up Recommendations: Inpatient Rehab Equipment Recommended: Defer to next venue PT Goals  Acute Rehab PT Goals PT Goal: Supine/Side to Sit - Progress: Progressing toward goal PT Goal: Sit at Edge Of Bed - Progress: Progressing toward goal PT Goal: Sit to Stand - Progress: Progressing toward goal PT Transfer Goal: Bed to Chair/Chair to Bed - Progress: Progressing toward goal PT Goal: Stand - Progress: Progressing toward goal  PT Treatment Precautions/Restrictions  Precautions Precautions: Fall Required Braces or Orthoses: Yes Other Brace/Splint: left LE cam boot for walking, left UE sling Restrictions Weight Bearing Restrictions: No LUE Weight Bearing: Non weight bearing Other Position/Activity Restrictions: Informed nursing that patient would benefit from a sling for left upper extremity to support it especially with transfers/OOB. Also left note for MD. Mobility (including Balance) Bed Mobility Supine to Sit: 3: Mod assist;HOB flat Supine to Sit Details (indicate cue type and reason): cues for technique Sitting - Scoot to Edge of Bed: 3: Mod assist Sitting - Scoot to Edge of Bed Details (indicate cue type and reason): used pad to scoot patient forward to EOB Transfers Sit to Stand: From bed;From elevated surface;1: +2 Total  assist Sit to Stand Details (indicate cue type and reason):  attempted with +2 assist and patient unable to stand; attempted multiple times with SARA Plus, but not able to push through LE's to stand. Squat Pivot Transfers: 1: +2 Total assist Squat Pivot Transfer Details (indicate cue type and reason): pt=20-25%, bed<>BSC  Dynamic Sitting Balance Dynamic Sitting - Balance Support: Right upper extremity supported;During functional activity (lat lean to scoot, get gown out; forward for peri hygiene) Dynamic Sitting - Level of Assistance: 5: Stand by assistance;3: Mod assist (supervision for lateral lean on bed, mod assist for forward ) Dynamic Sitting - Balance Activities: Lateral lean/weight shifting;Forward lean/weight shifting Exercise    End of Session PT - End of Session Equipment Utilized During Treatment: Gait belt Activity Tolerance: Patient limited by fatigue Patient left: in bed;with family/visitor present Nurse Communication: Mobility status for transfers General Behavior During Session: [facility] for tasks performed Cognition: [facility] for tasks performed  [facility] 08/25/2011, 3:51 PM

## 2011-08-25 NOTE — Plan of Care (Signed)
Problem: Phase III Progression Outcomes Goal: Activity advanced as tolerated Outcome: Completed/Met Date Met:  08/25/11 Physical Therapy assisted patient with a transfer to the [facility]

## 2011-08-25 NOTE — Procedures (Signed)
Modified Barium Swallow Procedure Note Patient Details  Name: [name] MRN: [id number] Date of Birth: 1956/02/21  Today's Date: 08/25/2011 Time: 9629-5284 Time Calculation (min): 23 min  Past Medical History:  Past Medical History  Diagnosis Date  . Ovarian cyst   . Pneumonia     hx x4  . Cancer     precancerous cervix  . GERD (gastroesophageal reflux disease)   . Headache   . Hiatal hernia   . PONV (postoperative nausea and vomiting) 08/06/11  . Difficult intubation    Past Surgical History:  Past Surgical History  Procedure Date  . Cesarean section 87  . Knee surgery 14    rt arthroscopy  . Ovarian cyst removal 80  . Abdominal hysterectomy 88  . Orif elbow fracture 08/06/2011    Procedure: OPEN REDUCTION INTERNAL FIXATION (ORIF) ELBOW/OLECRANON FRACTURE;  Surgeon: Sharma Covert;  Location: [facility] OR;  Service: Orthopedics;  Laterality: Left;  . Direct laryngoscopy 08/19/2011    Procedure: DIRECT LARYNGOSCOPY;  Surgeon: Antony Contras;  Location: [facility] ORS;  Service: ENT;  Laterality: N/A;  . Tracheostomy tube placement 08/19/2011    Procedure: TRACHEOSTOMY;  Surgeon: Antony Contras;  Location: [facility] ORS;  Service: ENT;  Laterality: N/A;  tracheoscopy   HPI:     Recommendation/Prognosis  Clinical Impression Dysphagia Diagnosis: Mild pharyngeal phase dysphagia Clinical impression: Mild pharyngeal dysphagia, with minimal affect on function, that should be managed easily with aspiration precautions.  There was trace/flash penetration with thin liquids when given by spoon, which  was transient .  When taking thin liquids by a straw there was trace penetration that did not spontaneously clear.  When taking small sips from the cup there was no penetration. Recommendations Solid Consistency: Regular Liquid Consistency: Thin Liquid Administration via: Cup Medication Administration: Whole meds with liquid Supervision: Patient able to self feed Compensations: Slow rate;Small  sips/bites Postural Changes and/or Swallow Maneuvers: Out of bed for meals;Seated upright 90 degrees Oral Care Recommendations: Oral care QID;Patient independent with oral care Other Recommendations: Place PMSV during PO intake;Clarify dietary restrictions Prognosis Prognosis for Safe Diet Advancement: Good Individuals Consulted Consulted and Agree with Results and Recommendations: Patient      SLP Goals   Patient will tolerate a Regular diet with thin liquids without s/s of aspiration. Patient will demonstrate and verbalize aspiration precautions independently.  General:  Other Pertinent Information: Trach downsized to #6 cuffless 12/12 and PMSV successfully tolerated for over one hour.   Type of Study: Initial MBS Diet Prior to this Study: NPO;Panda;Other (Comment) (Panda tube was clogged and removed on 12/12.) Temperature Spikes Noted: No Respiratory Status: Trach (Currently on room air.) Trach Size and Type: With PMSV in place;Uncuffed;#6 Behavior/Cognition: Alert;Cooperative;Pleasant mood Oral Cavity - Dentition: Adequate natural dentition Oral Motor / Sensory Function: Within functional limits Vision: Functional for self-feeding Patient Positioning: Upright in chair Baseline Vocal Quality: Normal Volitional Cough: Strong Volitional Swallow: Able to elicit Anatomy: Within functional limits Pharyngeal Secretions: Not observed secondary MBS Ice chips: Tested (comment) (Appeared to tolerate.)  Reason for Referral: dysphagia  Oral Phase Oral Preparation/Oral Phase Oral Phase: WFL Pharyngeal Phase  Pharyngeal Phase Pharyngeal Phase: Impaired Pharyngeal - Pudding Pharyngeal - Pudding Teaspoon: Reduced tongue base retraction;Pharyngeal residue - posterior pharnyx (Residue noted once with pudding.  Cleared with dry swallow. ) Pharyngeal - Thin Pharyngeal - Thin Teaspoon: Penetration/Aspiration during swallow;Other (Comment) (trace penetration with spoon  sip.) Penetration/Aspiration details (thin teaspoon): Material enters airway, remains ABOVE vocal cords then ejected out Pharyngeal -  Thin Straw: Penetration/Aspiration during swallow Penetration/Aspiration details (thin straw): Material enters airway, remains ABOVE vocal cords and not ejected out (Trace penetration; did not spontaneously clear.) Cervical Esophageal Phase  Cervical Esophageal Phase Cervical Esophageal Phase: Vicente Masson T 08/25/2011, 11:44 AM

## 2011-08-26 MED ORDER — ALTEPLASE 2 MG IJ SOLR
2.0000 mg | Freq: Once | INTRAMUSCULAR | Status: AC
  Administered 2011-08-26: 2 mg
  Filled 2011-08-26: qty 2

## 2011-08-26 NOTE — Progress Notes (Signed)
Critical Care Medicine Note  Patient Details:    15 yowf s/p elbow surgery 11/24 > discharged home with pain medications post operatively. Noted at home to be minimally responsive after sleeping/pain pill combo.  Adm 11/25 with aspiration pneumonia, AMS.   Lines/tubes 11/25 OETT>>>11/28 11/28 (reintubation)>>>12/6 12/6 urgent reintubation stridor cw tracheal stenosis>> 11/26 right IJ CVL>>>12/5 12/6 R PICC>>> 12/6 ENT Trach>>>12/12 change to #6 cuffless  Microbiology/Sepsis markers: 11/26 MRSA : neg 11/26 BCX2>>>neg 11/26 sputum: moderate Klebsiella oxytoca (pan sens, but resistance to Unasyn) 12/5 BAL>>>nml flora 12/6 BAL>> negative 12/6 resp culture>>>few GNR>>> 12/5 CVL tip Cx>>>NGTD>> 12/6 PCT>>>0.53 12/6 Lactic acid>>>1.1  Anti-infectives:  unasyn 11/26 (Asp)>>>11/28 clinda 11/26 (asp)>>> 11/27 11/28 rocephin (K oxytoca)>>>12/6 12/5 Vanc (line sepsis)>>>12/12 12/5 Primaxin (VAP)>>>12/13  Studies/Events: 11/27 bilat ue doppler neg 12/3 agitated with WUA 12/4 bradys down with weaning attempts, tolerated Ps 5/5 12/5 reattempts weaning>>failed, agitation 12/6 extubated/reintubated, plan for trach 12/6 CT neck>> tracheal narrowing, small bilateral pleural effusions L>R, bronch 12/7 to OR for ENT Trach  Subjective:    Overnight Issues: looks good on t-collar, working on PMV; trach changed to #6 cuffless    Objective:  Vital signs for last 24 hours: Temp:  [98.5 F (36.9 C)-99.9 F (37.7 C)] 99.9 F (37.7 C) (12/14 0510) Pulse Rate:  [94-110] 100  (12/14 0510) Resp:  [18-22] 20  (12/14 0510) BP: (120-140)/(77-83) 120/79 mmHg (12/14 0510) SpO2:  [95 %-98 %] 98 % (12/14 0510) Weight:  [192 lb 3.9 oz (87.2 kg)] 192 lb 3.9 oz (87.2 kg) (12/14 0500)    Intake/Output this shift:  Intake/Output Summary (Last 24 hours) at 08/26/11 1241 Last data filed at 08/26/11 1100  Gross per 24 hour  Intake    440 ml  Output    680 ml  Net   -240 ml     Physical Exam:    General:NAD Neuro: intact HEENT/Neck: trach with cap Resp: coarse bilat CVS: Tachy, regular, no m/r/g GI: soft, nontender, BS WNL, no r/g Skin: no rash Extremities: Wrapped left arm, L>R LE edema with pitting Lt arm wrap taken down. Sutures clean and dry, wound ok. Re wrapped   PCXR:  08/21/2011  *RADIOLOGY REPORT*  Clinical Data: Respiratory difficulty  PORTABLE CHEST - 1 VIEW  Comparison: Yesterday  Findings: Stable tubular devices and basilar atelectasis left greater than right.  Small left effusion is worse.  IMPRESSION: Small left effusion.  Original Report Authenticated By: Donavan Burnet, M.D.   Labs  Lab 08/25/11 0455 08/24/11 0625 08/23/11 0415  NA 136 136 139  K 3.8 3.5 3.4*  CL 98 101 101  CO2 30 29 30   BUN 12 12 12   CREATININE 0.60 0.50 0.60  GLUCOSE 93 133* 119*    Lab 08/25/11 0455 08/24/11 0625 08/24/11 0445  HGB 9.3* 8.8* 9.1*  HCT 30.6* 28.6* 26.9*  WBC 6.9 7.4 9.1  PLT 554* 466* 160     Assessment/plan:  Acute Respiratory failure with ALI and ARDS. S/P c/w aspiration event. She self extubated 11/28, and re-intubated with-in an hour. Extubated 12/6, reintubated within an hour, again. Her CXR has improved a little from 12/5.  I/O remain neg. CVP 14. Bedside Trach 12/7 (ENT); suspect large component of reintubation event due to UA obstruction/stridor.  Plan: -trach placement 12/7, tolerated ATC after all day 12/8 then TC 24 hours 12/9 -Continue bronchodilators -try to keep negative balance -cuffless trach 12/12 -12/13 tx to floor with tele, ent to follow trach. Will need DR Jenne Pane  ENT at bedside for decannulation , ? In OR>  Aspiration pneumonia/ Line sepsis -+ Klebsiella oxitoca, (intermediate sensitive to Unasyn). Rocephin started on 11/28. CVL pulled 12/5 (Cx). PICC placed 12/6.  -Added Vanc/ primaxin 12/5, plan 8 days dc vanc 12/12,12/13 all abx dc'd -Rocephin x 8 days/ dc 12/6 -follow CXR  AMS/encephalopathy, improved.  Plan:  - Decrease Seroquel  100mg  bid  12/9 - d/c clonadine 12/9 - home cymbalta 12/10 - resolving  Hypernatremia  Lab 08/25/11 0455 08/24/11 0625 08/23/11 0415  NA 136 136 139  Plan: -d/c extra water 11/30 -d/c d5w -Lasix 40 bid  Hypokalemia  Lab 08/25/11 0455 08/24/11 0625 08/23/11 0415 08/22/11 0300 08/21/11 1213  K 3.8 3.5 3.4* 3.3* 3.1*   -replenish 12/9, received KCL today -add scheduled K since on lasix -f/u bmp in am  Anemia: no evidence of bleeding  Lab 08/25/11 0455 08/24/11 0625 08/24/11 0445  HGB 9.3* 8.8* 9.1*    Plan: -transfuse for hemoglobin less than 7 -Continue proton pump inhibitor  Elbow surgery  - Dr Melvyn Novas following -wound   Malnutrition -TF restarted -speech eval ordered 12/10 for cuff down. Redo 12/13 eating 12/14  Dispo:  -  To floor 12/13   LOS: 19 days   Brett Canales Minor ACNP [facility] Pager [phone] till 3 pm If no answer page [phone] 08/26/2011, 12:41 PM  Levy Pupa, MD, PhD 08/26/2011, 1:00 PM [facility] [phone] or if no answer [phone]

## 2011-08-26 NOTE — Progress Notes (Signed)
Physical Therapy Treatment Patient Details Name: [name] MRN: [id number] DOB: 1956/09/09 Today's Date: 08/26/2011 1350-1430 3TA  PT Assessment/Plan  PT - Assessment/Plan Comments on Treatment Session: Patient able to stand with bed raised and pulling up on Stedy.  Will benefit from using this piece of equip for nursing to assist.  Pt interested in CIR consult.  Spoke with Chiropodist and nurse practitioner as well as nure case Production designer, theatre/television/film. PT Plan: Discharge plan remains appropriate PT Frequency: Min 4X/week Follow Up Recommendations: Inpatient Rehab Equipment Recommended: Defer to next venue PT Goals  Acute Rehab PT Goals PT Goal: Supine/Side to Sit - Progress: Progressing toward goal PT Goal: Sit at Edge Of Bed - Progress: Progressing toward goal PT Goal: Sit to Stand - Progress: Progressing toward goal PT Transfer Goal: Bed to Chair/Chair to Bed - Progress: Progressing toward goal PT Goal: Stand - Progress: Progressing toward goal  PT Treatment Precautions/Restrictions  Precautions Precautions: Fall Required Braces or Orthoses: Yes Other Brace/Splint: left LE cam boot for walking, left UE sling Restrictions Weight Bearing Restrictions: No LUE Weight Bearing: Non weight bearing Other Position/Activity Restrictions: Informed nursing that patient would benefit from a sling for left upper extremity to support it especially with transfers/OOB. Also left note for MD. Mobility (including Balance) Bed Mobility Supine to Sit: 3: Mod assist;HOB flat Supine to Sit Details (indicate cue type and reason): assist for upper body to lift upright Sitting - Scoot to Edge of Bed: 4: Min assist Transfers Sit to Stand: 3: Mod assist;From elevated surface;With upper extremity assist Sit to Stand Details (indicate cue type and reason): pulling up on bar on Stedy Stand to Sit: 3: Mod assist;To chair/3-in-1 Stand to Sit Details: assist for controlled lowering Transfer via  Lift Equipment: Education officer, community Sitting - Balance Support: Right upper extremity supported Static Sitting - Level of Assistance: 6: Modified independent (Device/Increase time) Static Sitting - Comment/# of Minutes: sat for approx 8 minutes while PT brushing hair to get out tangles Exercise  Other Exercises Other Exercises: Bridging with 5 sec hold x 10 reps End of Session PT - End of Session Activity Tolerance: Patient tolerated treatment well Patient left: in chair Nurse Communication: Need for lift equipment General Behavior During Session: [facility] for tasks performed Cognition: [facility] for tasks performed  [facility] 08/26/2011, 3:06 PM

## 2011-08-26 NOTE — Progress Notes (Signed)
Discussed in the long length of stay meeting [name] Weeks 08/26/2011  

## 2011-08-26 NOTE — Progress Notes (Signed)
Nutrition Follow-up  Diet has been advanced to Regular after performance at [facility] the other day. Continues to meet with SLP.  Pt ordering dinner meal at time of visit.  States she did not receive a lunch meal and is hungry.  Diet Order:  Regular  Meds: Scheduled Meds:   . alteplase  2 mg Intracatheter Once  . antiseptic oral rinse  15 mL Mouth Rinse QID  . bacitracin   Topical BID  . chlorhexidine  15 mL Mouth Rinse BID  . DULoxetine  30 mg Oral Daily  . furosemide  40 mg Oral Daily  . pantoprazole  40 mg Oral Q1200  . potassium chloride  40 mEq Oral Daily  . QUEtiapine  100 mg Oral BID  . sodium chloride  10 mL Intracatheter Q12H   Continuous Infusions:   . sodium chloride 20 mL/hr (08/25/11 1839)  . feeding supplement (JEVITY 1.2) 1,000 mL (08/24/11 0541)   PRN Meds:.acetaminophen, albuterol, promethazine, sodium chloride  Labs:  CMP     Component Value Date/Time   NA 136 08/25/2011 0455   K 3.8 08/25/2011 0455   CL 98 08/25/2011 0455   CO2 30 08/25/2011 0455   GLUCOSE 93 08/25/2011 0455   BUN 12 08/25/2011 0455   CREATININE 0.60 08/25/2011 0455   CALCIUM 9.4 08/25/2011 0455   PROT 6.0 08/11/2011 0349   ALBUMIN 2.4* 08/11/2011 0349   AST 29 08/11/2011 0349   ALT 17 08/11/2011 0349   ALKPHOS 95 08/11/2011 0349   BILITOT 0.5 08/11/2011 0349   GFRNONAA >90 08/25/2011 0455   GFRAA >90 08/25/2011 0455     Intake/Output Summary (Last 24 hours) at 08/26/11 1553 Last data filed at 08/26/11 1100  Gross per 24 hour  Intake    440 ml  Output    680 ml  Net   -240 ml    Weight Status:  Admission wt:  206 lbs Current wt: 192 lbs, 6.7% decrease in >2 weeks.  Nutrition Dx:  Inadequate oral intake, improved.  Intervention:   1.  Meals/snacks; assisted pt in ordering dinner meal per her preference.  Pt has good appetite.    Monitor:   1.  Food/Beverage; continued improvement with tolerance.   Hoyt Koch Pager #:  [phone]

## 2011-08-26 NOTE — Progress Notes (Signed)
Speech Language/Pathology Speech Pathology: Dysphagia Treatment Note and PMSV Treatment note [phone]  Patient was observed with : thin water and observed brushing her teeth.  Pt reports swallowing "is a lot better than it was."  SLP provided pt with toothbrush/paste to allow oral care.    Pt was provided a mirror for pt to use placing and removing PMSV, pt able to place PMSV easily however due to limited mobility of left arm, removal is more challenging.  Advised pt to leave valve in place and call RN for removal to prevent possibly dislodging trach.  Pt verbalized understanding.  Minimal verbal cues required for pt to don/remove valve properly, ? Impaired proprioception.    Patient was noted to have s/s of aspiration : no, cough x1 after gag when brushing teeth  Lung Sounds:  Productive cough Temperature: 99.9  Patient required:  NO cues to consistently follow precautions/strategies, pt recalled having liquid enter larynx during MBS when using straw and that she was advised not to use straws.  New precaution sign posted above bed and across room for pt and nursing.    Clinical Impression:  Poor intake of breakfast, pt stated food did not taste good.  Pt with excellent recall of swallow precautions and PMSV precautions.  SLP to follow up one more time to assure pt able to don/remove PMSV without incident.    Recommendations:  Continue diet and use of PMSV with precautions.  Pain:   none Intervention Required:   No   Goals: Goals Partially Met  Dollene Cleveland, MS [facility] SLP [phone]

## 2011-08-26 NOTE — Progress Notes (Signed)
CSW spoke with RN on how Pt was progressing. CSW attempted to discuss psych hx with daughter and fiance, they both state Pt has a h/o depression. Pt continues to be confused at times and not completely oriented at times. This may be a barrier for her progression. Pt wants to return home, but will need some rehab prior to d/c. Per PT, a CIR consult would be helpful. CSW will continue to follow and assist with plans for Pt.  Vennie Homans, [place] 08/26/2011 12:28 PM [phone]

## 2011-08-27 DIAGNOSIS — J984 Other disorders of lung: Secondary | ICD-10-CM

## 2011-08-27 LAB — CBC
HCT: 32 % — ABNORMAL LOW (ref 36.0–46.0)
Hemoglobin: 9.7 g/dL — ABNORMAL LOW (ref 12.0–15.0)
MCH: 23 pg — ABNORMAL LOW (ref 26.0–34.0)
RBC: 4.22 MIL/uL (ref 3.87–5.11)

## 2011-08-27 LAB — BASIC METABOLIC PANEL
CO2: 29 mEq/L (ref 19–32)
Glucose, Bld: 126 mg/dL — ABNORMAL HIGH (ref 70–99)
Potassium: 3.6 mEq/L (ref 3.5–5.1)
Sodium: 137 mEq/L (ref 135–145)

## 2011-08-27 NOTE — Progress Notes (Signed)
Called to room inner cannula was out. Pt states she didn't know how it occurred. Reinserted without difficulty.No resp distress noted. Pt instructed to not remove or readjust cannula. To call staff with issues related to trach. Pt verbalized understand.

## 2011-08-27 NOTE — Progress Notes (Signed)
PULMONARY PROGRESS NOTE:   Patient Details:    47 yowf s/p elbow surgery 11/24 > discharged home with pain medications post operatively. Noted at home to be minimally responsive after sleeping/pain pill combo.  Adm 11/25 with aspiration pneumonia, AMS.   Lines/tubes 11/25 OETT>>>11/28 11/28 (reintubation)>>>12/6 12/6 urgent reintubation stridor cw tracheal stenosis>> 11/26 right IJ CVL>>>12/5 12/6 R PICC>>> 12/6 ENT Trach>>>12/12 change to #6 cuffless  Microbiology/Sepsis markers: 11/26 MRSA : neg 11/26 BCX2>>>neg 11/26 sputum: moderate Klebsiella oxytoca (pan sens, but resistance to Unasyn) 12/5 BAL>>>nml flora 12/6 BAL>> negative 12/6 resp culture>>>few GNR>>> 12/5 CVL tip Cx>>>NGTD>> 12/6 PCT>>>0.53 12/6 Lactic acid>>>1.1  Anti-infectives:  unasyn 11/26 (Asp)>>>11/28 clinda 11/26 (asp)>>> 11/27 11/28 rocephin (K oxytoca)>>>12/6 12/5 Vanc (line sepsis)>>>12/12 12/5 Primaxin (VAP)>>>12/13  Studies/Events: 11/27 bilat ue doppler neg 12/3 agitated with WUA 12/4 bradys down with weaning attempts, tolerated Ps 5/5 12/5 reattempts weaning>>failed, agitation 12/6 extubated/reintubated, plan for trach 12/6 CT neck>> tracheal narrowing, small bilateral pleural effusions L>R, bronch 12/7 to OR for ENT Trach  Subjective:    Overnight Issues: looks good on t-collar, working on PMV; trach changed to #6 cuffless    Objective:  Vital signs for last 24 hours: Temp:  [98 F (36.7 C)-98.8 F (37.1 C)] 98.3 F (36.8 C) (12/15 0551) Pulse Rate:  [89-111] 89  (12/15 0551) Resp:  [16-20] 18  (12/15 0551) BP: (126-135)/(72-88) 126/72 mmHg (12/15 0551) SpO2:  [94 %-97 %] 95 % (12/15 0900) Weight:  [88.4 kg (194 lb 14.2 oz)] 194 lb 14.2 oz (88.4 kg) (12/15 0500)   Intake/Output this shift:  Intake/Output Summary (Last 24 hours) at 08/27/11 1348 Last data filed at 08/27/11 0552  Gross per 24 hour  Intake    220 ml  Output    700 ml  Net   -480 ml     Physical Exam:    General:NAD Neuro: intact HEENT/Neck: trach with cap Resp: coarse bilat CVS: Tachy, regular, no m/r/g GI: soft, nontender, BS WNL, no r/g Skin: no rash Extremities: Wrapped left arm, L>R LE edema with pitting Lt arm wrap taken down. Sutures clean and dry, wound ok. Re wrapped   XRAYS:  12/13> FLUOROSCOPY FOR SWALLOWING FUNCTION STUDY:  Fluoroscopy was provided for swallowing function study, which was  administered by a speech pathologist. Final results and recommendations  from this study are contained within the speech pathology report.   08/21/2011> PORTABLE CHEST - Findings: Stable tubular devices and basilar atelectasis left greater than right.  Small left effusion is worse.  IMPRESSION: Small left effusion.    Labs  Lab 08/27/11 0425 08/25/11 0455 08/24/11 0625  NA 137 136 136  K 3.6 3.8 3.5  CL 99 98 101  CO2 29 30 29   BUN 17 12 12   CREATININE 0.69 0.60 0.50  GLUCOSE 126* 93 133*    Lab 08/27/11 0425 08/25/11 0455 08/24/11 0625  HGB 9.7* 9.3* 8.8*  HCT 32.0* 30.6* 28.6*  WBC 4.8 6.9 7.4  PLT 473* 554* 466*     Assessment/plan:  Acute Respiratory failure with ALI and ARDS. S/P c/w aspiration event. She self extubated 11/28, and re-intubated with-in an hour. Extubated 12/6, reintubated within an hour, again. Her CXR has improved a little from 12/5.  I/O remain neg. CVP 14. Bedside Trach 12/7 (ENT); suspect large component of reintubation event due to UA obstruction/stridor.  Plan: -trach placement 12/7, tolerated ATC after all day 12/8 then TC 24 hours 12/9 -Continue bronchodilators -try to keep negative balance -cuffless trach  12/12 -12/13 tx to floor with tele, ent to follow trach. Will need DR Jenne Pane ENT at bedside for decannulation , ? In OR>  Aspiration pneumonia/ Line sepsis -+ Klebsiella oxitoca, (intermediate sensitive to Unasyn). Rocephin started on 11/28. CVL pulled 12/5 (Cx). PICC placed 12/6.  -Added Vanc/ primaxin 12/5, plan 8 days dc vanc 12/12,12/13  all abx dc'd -Rocephin x 8 days/ dc 12/6 -follow CXR  AMS/encephalopathy, improved.  Plan:  - Decrease Seroquel 100mg  bid  12/9 - d/c clonadine 12/9 - home cymbalta 12/10 - resolving  Hypernatremia  Lab 08/27/11 0425 08/25/11 0455 08/24/11 0625  NA 137 136 136  Plan: -d/c extra water 11/30 -d/c d5w -Lasix 40 bid  Hypokalemia  Lab 08/27/11 0425 08/25/11 0455 08/24/11 0625 08/23/11 0415 08/22/11 0300  K 3.6 3.8 3.5 3.4* 3.3*  -replenish 12/9, received KCL today -add scheduled K since on lasix -f/u bmp in am  Anemia: no evidence of bleeding  Lab 08/27/11 0425 08/25/11 0455 08/24/11 0625  HGB 9.7* 9.3* 8.8*  Plan: -transfuse for hemoglobin less than 7 -Continue proton pump inhibitor  Elbow surgery  - Dr Melvyn Novas following -wound   Malnutrition -TF restarted -speech eval ordered 12/10 for cuff down. Redo 12/13 eating 12/14  Dispo:  -  To floor 12/13 -she's anxious for discharge    LOS: 20 days   Scott M. Kriste Basque, MD 08/27/2011, 1:48 PM

## 2011-08-27 NOTE — Progress Notes (Signed)
0800- present ............Marland KitchenTrach cared performed [place] present. Pt suctioned and tolerated well. Pt upset because she wants to get in shower and wash her hair. Explained we would have to talk with MD. Memory Argue shower cap shampoo pt not receptive. Pt has been very rude and demanding throughoutt shift. Limits set and need met

## 2011-08-27 NOTE — Progress Notes (Signed)
Upon entering pt room, pt found playing with trach. Plug off and finger in trach. Pt reports that something fell off and she threw it away. Trach intact except gauze missing. Gauze replaced. Pt educated on infection control measures and to notify nurse immediately next time anything happens to trach. Pt and family state understanding. Florestine Avers M

## 2011-08-27 NOTE — Progress Notes (Signed)
Pt refusing trach care at this time. Pt educated on importance of trach care and on MD order for routine trach care. Pt continues to refuse care. Pt agrees to allow RN to complete trach care in early AM. Will follow up with pt at this time. Florestine Avers M

## 2011-08-27 NOTE — Plan of Care (Signed)
Problem: Phase II Progression Outcomes Goal: Progress activities as ordered OOB up in chair

## 2011-08-27 NOTE — Progress Notes (Signed)
Respiratory Therapist reports she performed routine trach care at this time. Assisted pt with oral care. Pt denies needs. Will continue to monitor. [name]

## 2011-08-28 DIAGNOSIS — J984 Other disorders of lung: Secondary | ICD-10-CM

## 2011-08-28 DIAGNOSIS — J690 Pneumonitis due to inhalation of food and vomit: Secondary | ICD-10-CM

## 2011-08-28 DIAGNOSIS — G9340 Encephalopathy, unspecified: Secondary | ICD-10-CM

## 2011-08-28 DIAGNOSIS — J9600 Acute respiratory failure, unspecified whether with hypoxia or hypercapnia: Secondary | ICD-10-CM

## 2011-08-28 NOTE — Progress Notes (Signed)
PULMONARY PROGRESS NOTE:   Patient Details:    45 yowf s/p elbow surgery 11/24 > discharged home with pain medications post operatively. Noted at home to be minimally responsive after sleeping/pain pill combo.  Adm 11/25 with aspiration pneumonia, AMS.   Lines/tubes 11/25 OETT>>>11/28 11/28 (reintubation)>>>12/6 12/6 urgent reintubation stridor cw tracheal stenosis>> 11/26 right IJ CVL>>>12/5 12/6 R PICC>>> 12/6 ENT Trach>>>12/12 change to #6 cuffless  Microbiology/Sepsis markers: 11/26 MRSA : neg 11/26 BCX2>>>neg 11/26 sputum: moderate Klebsiella oxytoca (pan sens, but resistance to Unasyn) 12/5 BAL>>>nml flora 12/6 BAL>> negative 12/6 resp culture>>>few GNR>>> 12/5 CVL tip Cx>>>NGTD>> 12/6 PCT>>>0.53 12/6 Lactic acid>>>1.1  Anti-infectives:  unasyn 11/26 (Asp)>>>11/28 clinda 11/26 (asp)>>> 11/27 11/28 rocephin (K oxytoca)>>>12/6 12/5 Vanc (line sepsis)>>>12/12 12/5 Primaxin (VAP)>>>12/13  Studies/Events: 11/27 bilat ue doppler neg 12/3 agitated with WUA 12/4 bradys down with weaning attempts, tolerated Ps 5/5 12/5 reattempts weaning>>failed, agitation 12/6 extubated/reintubated, plan for trach 12/6 CT neck>> tracheal narrowing, small bilateral pleural effusions L>R, bronch 12/7 to OR for ENT Trach  Subjective:    Overnight Issues: looks good on t-collar, trach changed to #6 cuffless, ?still using bedpan ==> needs PT/OT   Objective:  Vital signs for last 24 hours: Temp:  [98.6 F (37 C)-99.4 F (37.4 C)] 98.7 F (37.1 C) (12/16 1405) Pulse Rate:  [95-100] 95  (12/16 1405) Resp:  [16-18] 18  (12/16 1405) BP: (113-136)/(71-87) 113/81 mmHg (12/16 1405) SpO2:  [94 %-97 %] 95 % (12/16 1405) Weight:  [85.5 kg (188 lb 7.9 oz)] 188 lb 7.9 oz (85.5 kg) (12/16 0500)   Intake/Output this shift:  Intake/Output Summary (Last 24 hours) at 08/28/11 1543 Last data filed at 08/28/11 1245  Gross per 24 hour  Intake   2140 ml  Output   1700 ml  Net    440 ml      Physical Exam:  General:NAD Neuro: intact HEENT/Neck: trach with cap Resp: coarse bilat CVS: Tachy, regular, no m/r/g GI: soft, nontender, BS WNL, no r/g Skin: no rash Extremities: Wrapped left arm, L>R LE edema with pitting Lt arm wrap taken down. Sutures clean and dry, wound ok. Re wrapped   XRAYS:  12/13> FLUOROSCOPY FOR SWALLOWING FUNCTION STUDY:  Fluoroscopy was provided for swallowing function study, which was  administered by a speech pathologist. Final results and recommendations  from this study are contained within the speech pathology report.   08/21/2011> PORTABLE CHEST - Findings: Stable tubular devices and basilar atelectasis left greater than right.  Small left effusion is worse.  IMPRESSION: Small left effusion.    Labs  Lab 08/27/11 0425 08/25/11 0455 08/24/11 0625  NA 137 136 136  K 3.6 3.8 3.5  CL 99 98 101  CO2 29 30 29   BUN 17 12 12   CREATININE 0.69 0.60 0.50  GLUCOSE 126* 93 133*    Lab 08/27/11 0425 08/25/11 0455 08/24/11 0625  HGB 9.7* 9.3* 8.8*  HCT 32.0* 30.6* 28.6*  WBC 4.8 6.9 7.4  PLT 473* 554* 466*     Assessment/plan:  Acute Respiratory failure with ALI and ARDS. S/P c/w aspiration event. She self extubated 11/28, and re-intubated with-in an hour. Extubated 12/6, reintubated within an hour, again. Her CXR has improved a little from 12/5.  I/O remain neg. CVP 14. Bedside Trach 12/7 (ENT); suspect large component of reintubation event due to UA obstruction/stridor.  Plan: -trach placement 12/7, tolerated ATC after all day 12/8 then TC 24 hours 12/9 -Continue bronchodilators -try to keep negative balance -cuffless trach  12/12 -12/13 tx to floor with tele, ent to follow trach. Will need DR Jenne Pane ENT at bedside for decannulation , ? In OR>  Aspiration pneumonia/ Line sepsis -+ Klebsiella oxitoca, (intermediate sensitive to Unasyn). Rocephin started on 11/28. CVL pulled 12/5 (Cx). PICC placed 12/6.  -Added Vanc/ primaxin 12/5, plan 8 days  dc vanc 12/12,12/13 all abx dc'd -Rocephin x 8 days/ dc 12/6 -follow CXR  AMS/encephalopathy, improved.  Plan:  - Decrease Seroquel 100mg  bid  12/9 - d/c clonadine 12/9 - home cymbalta 12/10 - resolving  Hypernatremia  Lab 08/27/11 0425 08/25/11 0455 08/24/11 0625  NA 137 136 136  Plan: -d/c extra water 11/30 -d/c d5w -Lasix 40 bid  Hypokalemia  Lab 08/27/11 0425 08/25/11 0455 08/24/11 0625 08/23/11 0415 08/22/11 0300  K 3.6 3.8 3.5 3.4* 3.3*  -replenish 12/9, received KCL today -add scheduled K since on lasix -f/u bmp in am  Anemia: no evidence of bleeding  Lab 08/27/11 0425 08/25/11 0455 08/24/11 0625  HGB 9.7* 9.3* 8.8*  Plan: -transfuse for hemoglobin less than 7 -Continue proton pump inhibitor  Elbow surgery  - Dr Melvyn Novas following -wound   Malnutrition -TF restarted -speech eval ordered 12/10 for cuff down. Redo 12/13 eating 12/14  Dispo:  -  To floor 12/13 -needs incr PT/OT ==> ordered    LOS: 21 days   Hjalmar Ballengee M. Kriste Basque, MD 08/28/2011, 3:43 PM

## 2011-08-28 NOTE — Procedures (Signed)
Inner cannula removed and trach care done and tolerated well. Site appears slightly reddened but healing and dry. Gauze replaced under trach flang and trach ties secured. Pt can vocalize with out the use of her PMV BBS are equal with good aeration after procedure. 02 sats are 96% on room air.

## 2011-08-29 ENCOUNTER — Inpatient Hospital Stay (HOSPITAL_COMMUNITY): Payer: No Typology Code available for payment source

## 2011-08-29 DIAGNOSIS — S52123A Displaced fracture of head of unspecified radius, initial encounter for closed fracture: Secondary | ICD-10-CM

## 2011-08-29 DIAGNOSIS — J9620 Acute and chronic respiratory failure, unspecified whether with hypoxia or hypercapnia: Secondary | ICD-10-CM

## 2011-08-29 DIAGNOSIS — R5381 Other malaise: Secondary | ICD-10-CM

## 2011-08-29 DIAGNOSIS — G929 Unspecified toxic encephalopathy: Secondary | ICD-10-CM

## 2011-08-29 DIAGNOSIS — G92 Toxic encephalopathy: Secondary | ICD-10-CM

## 2011-08-29 DIAGNOSIS — Z930 Tracheostomy status: Secondary | ICD-10-CM

## 2011-08-29 NOTE — Consult Note (Signed)
Physical Medicine and Rehabilitation Consult Reason for Consult AMS Referring Phsyician: CCM [name] is an 55 y.o. female.   HPI: 55 year old right-handed female recent left elbow surgery with open reduction internal fixation of displaced radial head fracture per Dr. Melvyn Novas on November 24. She was discharged home admitted on November 25 with altered mental status with persistent hypoxia. Patient with progressive respiratory distress and received followed by critical care medicine for suggested  aspiration event. She should ultimately receive tracheostomy unchanged a #6 cuff was on December 12. Overall mental status has improved but still altered suggesting encephalopathy. No cranial CT scan was noted. Working with Passy-Muir valve per speech therapy. She remains nonweightbearing to left upper extremity after recent elbow surgery. Upper and lower extremity venous Dopplers were negative for  deep vein thrombosis. She does the cam boot on the left lower extremity  for left distal fibula fracture and is currently weightbearing as tolerated to the left leg. She is currently moderate assist for supine to sit and transfers via lift equipment. Ambulation has yet to be tested. Inpatient rehabilitation services was consulted for questionable inpatient rehabilitation.  Review of Systems  Constitutional: Positive for malaise/fatigue.  Eyes: Negative for double vision.  Respiratory: Negative for cough and shortness of breath.   Cardiovascular: Negative for chest pain.  Gastrointestinal: Positive for constipation. Negative for nausea.  Genitourinary: Negative for dysuria.  Musculoskeletal: Positive for joint pain.  Skin: Negative.   Neurological: Negative for dizziness and headaches.  Psychiatric/Behavioral: Negative for depression. The patient has insomnia.        Anxiety   Past Medical History  Diagnosis Date  . Ovarian cyst   . Pneumonia     hx x4  . Cancer     precancerous cervix  . GERD  (gastroesophageal reflux disease)   . Headache   . Hiatal hernia   . PONV (postoperative nausea and vomiting) 08/06/11  . Difficult intubation    Past Surgical History  Procedure Date  . Cesarean section 87  . Knee surgery 14    rt arthroscopy  . Ovarian cyst removal 80  . Abdominal hysterectomy 88  . Orif elbow fracture 08/06/2011    Procedure: OPEN REDUCTION INTERNAL FIXATION (ORIF) ELBOW/OLECRANON FRACTURE;  Surgeon: Sharma Covert;  Location: [facility] OR;  Service: Orthopedics;  Laterality: Left;  . Direct laryngoscopy 08/19/2011    Procedure: DIRECT LARYNGOSCOPY;  Surgeon: Antony Contras;  Location: [facility];  Service: ENT;  Laterality: N/A;  . Tracheostomy tube placement 08/19/2011    Procedure: TRACHEOSTOMY;  Surgeon: Antony Contras;  Location: [facility];  Service: ENT;  Laterality: N/A;  tracheoscopy   History reviewed. No pertinent family history. Social History:  reports that she has never smoked. She does not have any smokeless tobacco history on file. She reports that she drinks alcohol. She reports that she does not use illicit drugs. Allergies:  Allergies  Allergen Reactions  . Meperidine Hcl Itching    REACTION: hives, itich   Medications Prior to Admission  Medication Dose Route Frequency Provider Last Rate Last Dose  . 0.9 %  sodium chloride infusion   Intravenous Continuous Domenick Roma 100 mL/hr at 08/08/11 2055    . 0.9 %  sodium chloride infusion   Intravenous Continuous Rakesh V. Vassie Loll, MD 20 mL/hr at 08/27/11 1640    . acetaminophen (TYLENOL) 325 MG suppository        975 mg at 08/07/11 2324  . acetaminophen (TYLENOL) tablet 650 mg  650 mg Oral  Q4H PRN Kalman Shan, MD   650 mg at 08/28/11 2003  . albuterol (PROVENTIL) (5 MG/ML) 0.5% nebulizer solution 2.5 mg  2.5 mg Nebulization Q4H PRN Billy Fischer, MD   2.5 mg at 08/18/11 0759  . albuterol (PROVENTIL) (5 MG/ML) 0.5% nebulizer solution 5 mg  5 mg Nebulization Once Lyanne Co, MD   5 mg at 08/07/11 2110  .  alteplase (CATHFLO ACTIVASE) injection 2 mg  2 mg Intracatheter Once Leslye Peer, MD   2 mg at 08/26/11 1500  . antiseptic oral rinse (BIOTENE) solution 15 mL  15 mL Mouth Rinse QID Sandrea Hughs, MD   15 mL at 08/29/11 0809  . aspirin chewable tablet 324 mg  324 mg Oral NOW Domenick Roma       Or  . aspirin suppository 300 mg  300 mg Rectal NOW Domenick Roma   300 mg at 08/08/11 0351  . bacitracin ointment   Topical BID Antony Contras      . ceFAZolin (ANCEF) 2-3 GM-% IVPB SOLR           . ceFAZolin (ANCEF) IVPB 1 g/50 mL premix  1 g Intravenous Q6H Fred W Ortmann   1 g at 08/07/11 0342  . ceFAZolin (ANCEF) IVPB 1 g/50 mL premix    PRN Gwenyth Allegra, CRNA   1,000 mg at 08/07/11 0341  . chlorhexidine (PERIDEX) 0.12 % solution 15 mL  15 mL Mouth Rinse BID Sandrea Hughs, MD   15 mL at 08/29/11 0910  . clindamycin (CLEOCIN) IVPB 900 mg  900 mg Intravenous Once Lyanne Co, MD   900 mg at 08/07/11 2336  . DULoxetine (CYMBALTA) DR capsule 30 mg  30 mg Oral Daily Leslye Peer, MD   30 mg at 08/28/11 1026  . etomidate (AMIDATE) 2 MG/ML injection           . etomidate (AMIDATE) 2 MG/ML injection        10 mg at 08/18/11 0915  . fentaNYL (SUBLIMAZE) injection 100 mcg  100 mcg Intravenous Once Rakesh V. Vassie Loll, MD      . fentaNYL [facility]) injection    PRN Gwenyth Allegra, CRNA   100 mcg at 08/06/11 0831  . furosemide (LASIX) injection 40 mg  40 mg Intravenous Once Sandrea Hughs, MD   40 mg at 08/12/11 1313  . furosemide (LASIX) tablet 40 mg  40 mg Oral Daily Vilinda Blanks Minor, NP   40 mg at 08/28/11 1026  . glycopyrrolate (ROBINUL) injection    PRN Deatra Robinson Mahony   0.6 mg at 08/06/11 1027  . haloperidol lactate (HALDOL) 5 MG/ML injection           . influenza  inactive virus vaccine (FLUZONE/FLUARIX) injection 0.5 mL  0.5 mL Intramuscular Tomorrow-1000 Leslye Peer, MD   0.5 mL at 08/09/11 0911  . iohexol (OMNIPAQUE) 300 MG/ML injection 100 mL  100 mL Intravenous Once PRN Medication  Radiologist   100 mL at 08/08/11 0137  . iohexol (OMNIPAQUE) 300 MG/ML injection 100 mL  100 mL Intravenous Once PRN Medication Radiologist   100 mL at 08/18/11 1215  . ipratropium (ATROVENT) nebulizer solution 0.5 mg  0.5 mg Nebulization Once Lyanne Co, MD   0.5 mg at 08/07/11 2110  . lactated ringers infusion    Continuous PRN Gwenyth Allegra, CRNA      . lidocaine (cardiac) 100 mg/41ml (XYLOCAINE) 20 MG/ML injection 2%           .  lidocaine (cardiac) 100 mg/64ml (XYLOCAINE) 20 MG/ML injection 2%           . midazolam (VERSED) 5 MG/5ML injection    PRN Gwenyth Allegra, CRNA   2 mg at 08/06/11 0815  . naloxone [facility]) 1 MG/ML injection           . naloxone (NARCAN) 1 MG/ML injection           . neostigmine (PROSTIGMINE) injection   Intravenous PRN Deatra Robinson Mahony   3 mg at 08/06/11 1027  . ondansetron (ZOFRAN) 4 MG/2ML injection           . ondansetron (ZOFRAN) injection    PRN Deatra Robinson Mahony   4 mg at 08/06/11 1022  . pantoprazole (PROTONIX) EC tablet 40 mg  40 mg Oral Q1200 Max Fickle, MD   40 mg at 08/28/11 1200  . phenylephrine (NEO-SYNEPHRINE) injection    PRN Deatra Robinson Mahony   80 mcg at 08/06/11 4098  . potassium chloride 10 mEq in 50 mL *CENTRAL LINE* IVPB  10 mEq Intravenous Q1 Hr x 4 Anders Simmonds, NP   10 mEq at 08/10/11 1748  . potassium chloride 10 mEq in 50 mL *CENTRAL LINE* IVPB  10 mEq Intravenous Q1 Hr x 4 Elizabeth Deterding   10 mEq at 08/11/11 0907  . potassium chloride 20 MEQ/15ML (10%) liquid 40 mEq  40 mEq Per Tube Once Billy Fischer, MD   40 mEq at 08/13/11 0704  . potassium chloride 20 MEQ/15ML (10%) liquid 40 mEq  40 mEq Per Tube Once Mcarthur Rossetti. Feinstein   40 mEq at 08/13/11 1017  . potassium chloride 20 MEQ/15ML (10%) liquid 40 mEq  40 mEq Per Tube Once Mcarthur Rossetti. Feinstein   40 mEq at 08/14/11 1314  . potassium chloride 20 MEQ/15ML (10%) liquid 40 mEq  40 mEq Per Tube Once Vilinda Blanks Minor, NP   40 mEq at 08/15/11 1029  . potassium chloride 20  MEQ/15ML (10%) liquid 40 mEq  40 mEq Per Tube Once Elizabeth Deterding   40 mEq at 08/16/11 0540  . potassium chloride 20 MEQ/15ML (10%) liquid 40 mEq  40 mEq Oral Once Elizabeth Deterding   40 mEq at 08/17/11 0757  . potassium chloride 20 MEQ/15ML (10%) liquid 40 mEq  40 mEq Oral Once Leslye Peer, MD   40 mEq at 08/21/11 1243  . potassium chloride 20 MEQ/15ML (10%) liquid 40 mEq  40 mEq Oral Daily Berkley Harvey, PHARMD   40 mEq at 08/28/11 1026  . potassium chloride 20 MEQ/15ML (10%) liquid 80 mEq  80 mEq Per Tube Once Elizabeth Deterding   80 mEq at 08/19/11 0629  . potassium chloride 20 MEQ/15ML (10%) liquid 80 mEq  80 mEq Per Tube Once Elizabeth Deterding   80 mEq at 08/20/11 0708  . potassium chloride 20 MEQ/15ML (10%) liquid 80 mEq  80 mEq Per Tube Once Elizabeth Deterding   80 mEq at 08/21/11 0736  . potassium chloride SA (K-DUR,KLOR-CON) CR tablet 20 mEq  20 mEq Oral Once Vilinda Blanks Minor, NP   20 mEq at 08/24/11 0927  . promethazine (PHENERGAN) injection 12.5 mg  12.5 mg Intravenous Q4H PRN Vilinda Blanks Minor, NP      . propofol (DIPRIVAN) 10 MG/ML infusion           . QUEtiapine (SEROQUEL) tablet 100 mg  100 mg Oral BID Leslye Peer, MD   100 mg at 08/28/11 2306  . Racepinephrine HCl 2.25 %  nebulizer solution           . rocuronium (ZEMURON) 50 MG/5ML injection           . rocuronium (ZEMURON) 50 MG/5ML injection           . rocuronium (ZEMURON) injection    PRN Gwenyth Allegra, CRNA   50 mg at 08/06/11 1610  . sodium chloride 0.9 % injection 10 mL  10 mL Intracatheter Q12H [name] Grout, MD   10 mL at 08/25/11 1002  . sodium chloride 0.9 % injection 10 mL  10 mL Intracatheter PRN [name] Grout, MD   10 mL at 08/28/11 1637  . succinylcholine (ANECTINE) 20 MG/ML injection           . succinylcholine (ANECTINE) 20 MG/ML injection        50 mg at 08/18/11 0910  . DISCONTD: 0.9 %  sodium chloride infusion  250 mL Intravenous PRN Domenick Roma 20 mL/hr at 08/11/11 1100 250 mL at  08/11/11 1100  . DISCONTD: acetylcysteine (MUCOMYST) 10 % nebulizer solution 2 mL  2 mL Nebulization Q6H Kalman Shan, MD      . DISCONTD: acetylcysteine (MUCOMYST) 20 % nebulizer solution 1 mL  1 mL Nebulization Q6H Lorenza Evangelist, PHARMD   1 mL at 08/18/11 0758  . DISCONTD: albuterol (PROVENTIL) (5 MG/ML) 0.5% nebulizer solution 5 mg  5 mg Nebulization Q4H PRN Domenick Roma      . DISCONTD: Ampicillin-Sulbactam (UNASYN) 3 g in sodium chloride 0.9 % 100 mL IVPB  3 g Intravenous Q6H Domenick Roma   3 g at 08/10/11 0811  . DISCONTD: ceFAZolin (ANCEF) IVPB 2 g/50 mL premix  2 g Intravenous 60 min Pre-Op Sharma Covert      . DISCONTD: cefTRIAXone (ROCEPHIN) 1 g in dextrose 5 % 50 mL IVPB  1 g Intravenous Q24H Vilinda Blanks Minor, NP   1 g at 08/17/11 1307  . DISCONTD: chlorhexidine (HIBICLENS) 4 % liquid 4 application  60 mL Topical Once Fred W Ortmann      . DISCONTD: chlorhexidine (PERIDEX) 0.12 % solution 15 mL  15 mL Mouth/Throat BID Leslye Peer, MD   15 mL at 08/18/11 2007  . DISCONTD: clonazePAM (KLONOPIN) 0.1 mg/mL oral suspension 0.5 mg  0.5 mg Oral BID Anders Simmonds, NP      . DISCONTD: clonazePAM (KLONOPIN) tablet 0.5 mg  0.5 mg Oral BID Reece Packer, PHARMD   0.5 mg at 08/15/11 0925  . DISCONTD: clonazePAM (KLONOPIN) tablet 1 mg  1 mg Oral BID Vilinda Blanks Minor, NP   1 mg at 08/17/11 1034  . DISCONTD: cloNIDine (CATAPRES) tablet 0.1 mg  0.1 mg Oral TID Sandrea Hughs, MD   0.1 mg at 08/13/11 1017  . DISCONTD: cloNIDine (CATAPRES) tablet 0.1 mg  0.1 mg Oral BID Rakesh V. Vassie Loll, MD   0.1 mg at 08/21/11 1004  . DISCONTD: dexmedetomidine (PRECEDEX) 200 mcg in sodium chloride 0.9 % 50 mL infusion  0.4-1.2 mcg/kg/hr Intravenous Continuous Anders Simmonds, NP      . DISCONTD: dextrose 5 % and 0.45 % NaCl with KCl 20 mEq/L infusion   Intravenous Continuous Thomasene Ripple Ortmann 75 mL/hr at 08/07/11 0342    . DISCONTD: dextrose 5 % solution   Intravenous Continuous Mcarthur Rossetti. Feinstein 20 mL/hr at  08/20/11 1332 20 mL at 08/20/11 1332  . DISCONTD: dextrose 5 %-0.45 % sodium chloride infusion   Intravenous Continuous Anders Simmonds, NP      . DISCONTD:  diphenhydrAMINE (BENADRYL) 12.5 MG/5ML elixir 12.5-25 mg  12.5-25 mg Oral Q4H PRN Sharma Covert      . DISCONTD: droperidol (INAPSINE) injection 0.625 mg  0.625 mg Intravenous PRN [name], MD      . DISCONTD: DULoxetine (CYMBALTA) DR capsule 30 mg  30 mg Oral BID Fred W Ortmann   30 mg at 08/06/11 1346  . DISCONTD: DULoxetine (CYMBALTA) DR capsule 30 mg  30 mg Oral Daily Domenick Roma   30 mg at 08/12/11 1033  . DISCONTD: enoxaparin (LOVENOX) injection 40 mg  40 mg Subcutaneous Q24H Domenick Roma   40 mg at 08/12/11 1047  . DISCONTD: feeding supplement (JEVITY 1.2) liquid 1,000 mL  1,000 mL Oral Continuous Canary Brim, NP      . DISCONTD: feeding supplement (JEVITY 1.2) liquid 1,000 mL  1,000 mL Per Tube Continuous Hoyt Koch, RD 70 mL/hr at 08/24/11 0541 1,000 mL at 08/24/11 0541  . DISCONTD: feeding supplement (OXEPA) liquid 1,000 mL  1,000 mL Per Tube Continuous Hoyt Koch, RD 25 mL/hr at 08/11/11 2211 1,000 mL at 08/11/11 2211  . DISCONTD: feeding supplement (OXEPA) liquid 1,000 mL  1,000 mL Per Tube Continuous Mcarthur Rossetti. Feinstein 20 mL/hr at 08/14/11 1318 1,000 mL at 08/14/11 1318  . DISCONTD: feeding supplement (OXEPA) liquid 1,000 mL  1,000 mL Per Tube Continuous Elyse Earlie Raveling, RD 20 mL/hr at 08/17/11 0600 1,000 mL at 08/17/11 0600  . DISCONTD: feeding supplement (OXEPA) liquid 1,000 mL  1,000 mL Per Tube Continuous Lonia Farber, MD 20 mL/hr at 08/20/11 1614 1,000 mL at 08/20/11 1614  . DISCONTD: feeding supplement (OXEPA) liquid 1,000 mL  1,000 mL Per Tube Q24H Christine Elizabeth Paspek, PHARMD   1,000 mL at 08/21/11 1610  . DISCONTD: feeding supplement (PRO-STAT SUGAR FREE 64) liquid 30 mL  30 mL Oral TID WC Hoyt Koch, RD   30 mL at 08/14/11 1306  . DISCONTD:  feeding supplement (PRO-STAT SUGAR FREE 64) liquid 30 mL  30 mL Per Tube TID WC Elyse Earlie Raveling, RD   30 mL at 08/22/11 1151  . DISCONTD: fentaNYL (SUBLIMAZE) 10 mcg/mL in sodium chloride 0.9 % 250 mL infusion  50-400 mcg/hr Intravenous Titrated Domenick Roma 5 mL/hr at 08/10/11 1000 50 mcg/hr at 08/10/11 1000  . DISCONTD: fentaNYL (SUBLIMAZE) 10 mcg/mL in sodium chloride 0.9 % 250 mL infusion  25-400 mcg/hr Intravenous Continuous Anders Simmonds, NP   25 mcg/hr at 08/17/11 0800  . DISCONTD: fentaNYL (SUBLIMAZE) bolus via infusion 50-100 mcg  50-100 mcg Intravenous Q6H PRN Domenick Roma      . DISCONTD: free water 200 mL  200 mL Per Tube Q8H Anders Simmonds, NP   200 mL at 08/12/11 1049  . DISCONTD: furosemide (LASIX) 8 MG/ML solution 40 mg  40 mg Oral BID Rakesh V. Vassie Loll, MD      . DISCONTD: furosemide (LASIX) 8 MG/ML solution 40 mg  40 mg Per Tube BID Rakesh V. Vassie Loll, MD   40 mg at 08/22/11 1733  . DISCONTD: furosemide (LASIX) 8 MG/ML solution 40 mg  40 mg Per Tube BID Christian M Buettner, PHARMD   40 mg at 08/24/11 1914  . DISCONTD: furosemide (LASIX) injection 40 mg  40 mg Intravenous Q12H Anders Simmonds, NP   40 mg at 08/11/11 1145  . DISCONTD: furosemide (LASIX) injection 40 mg  40 mg Intravenous Q12H Daniel J. Feinstein   40 mg at 08/14/11 0941  . DISCONTD: furosemide (LASIX) injection 40 mg  40 mg Intravenous TID Mcarthur Rossetti. Feinstein   40 mg at 08/17/11 1033  . DISCONTD: furosemide (LASIX) tablet 40 mg  40 mg Oral BID Max Fickle, MD   40 mg at 08/25/11 0815  . DISCONTD: HYDROcodone-acetaminophen (NORCO) 5-325 MG per tablet 1 tablet  1 tablet Oral Q8H PRN Sharma Covert      . DISCONTD: HYDROcodone-acetaminophen (NORCO) 5-325 MG per tablet 1.5-3 tablet  1.5-3 tablet Oral Q4H PRN Severiano Gilbert, PHARMD      . DISCONTD: HYDROcodone-acetaminophen (NORCO) 7.5-325 MG per tablet 1-2 tablet  1-2 tablet Oral Q4H PRN Sharma Covert      . DISCONTD: HYDROmorphone (DILAUDID) injection 0.25-0.5 mg   0.25-0.5 mg Intravenous Q5 min PRN [name], MD      . DISCONTD: HYDROmorphone (DILAUDID) injection 0.5-1 mg  0.5-1 mg Intravenous Q2H PRN Thomasene Ripple Ortmann   1 mg at 08/07/11 0910  . DISCONTD: ibuprofen (ADVIL,MOTRIN) tablet 800 mg  800 mg Oral Q6H PRN Sharma Covert      . DISCONTD: imipenem-cilastatin (PRIMAXIN) 500 mg in sodium chloride 0.9 % 100 mL IVPB  500 mg Intravenous Q6H Lorenza Evangelist, PHARMD   500 mg at 08/25/11 1146  . DISCONTD: lidocaine-EPINEPHrine (XYLOCAINE W/EPI) 1 %-1:100000 (with pres) injection    PRN Excell Seltzer Bates   3 mL at 08/19/11 1236  . DISCONTD: meperidine (DEMEROL) injection 6.25-12.5 mg  6.25-12.5 mg Intravenous Q5 min PRN [name], MD      . DISCONTD: methocarbamol (ROBAXIN) 500 mg in dextrose 5 % 50 mL IVPB  500 mg Intravenous Q6H PRN Sharma Covert      . DISCONTD: methocarbamol (ROBAXIN) tablet 500 mg  500 mg Oral Q6H PRN Thomasene Ripple Ortmann   500 mg at 08/07/11 1130  . DISCONTD: metoCLOPramide (REGLAN) injection 5-10 mg  5-10 mg Intravenous Q8H PRN Thomasene Ripple Ortmann   10 mg at 08/06/11 2234  . DISCONTD: metoCLOPramide (REGLAN) tablet 5-10 mg  5-10 mg Oral Q8H PRN Sharma Covert      . DISCONTD: midazolam (VERSED) injection 0.5-2 mg  0.5-2 mg Intravenous Once PRN [name], MD      . DISCONTD: ondansetron [facility]) injection 4 mg  4 mg Intravenous Q6H PRN Sharma Covert      . DISCONTD: ondansetron (ZOFRAN) tablet 4 mg  4 mg Oral Q6H PRN Thomasene Ripple Ortmann   4 mg at 08/07/11 0123  . DISCONTD: oxyCODONE (Oxy IR/ROXICODONE) immediate release tablet 5-10 mg  5-10 mg Oral Q3H PRN Thomasene Ripple Ortmann   10 mg at 08/07/11 1130  . DISCONTD: oxyCODONE-acetaminophen (PERCOCET) 5-325 MG per tablet 1-2 tablet  1-2 tablet Oral Q4H PRN Sharma Covert   2 tablet at 08/07/11 1308  . DISCONTD: pantoprazole (PROTONIX) EC tablet 40 mg  40 mg Oral Q1200 Fred W Ortmann   40 mg at 08/06/11 1346  . DISCONTD: pantoprazole (PROTONIX) EC tablet 40 mg  40 mg Oral Q1200 Domenick  Roma      . DISCONTD: pantoprazole sodium (PROTONIX) 40 mg/20 mL oral suspension 40 mg  40 mg Per Tube Q1200 Sandrea Hughs, MD   40 mg at 08/24/11 1316  . DISCONTD: potassium chloride 20 MEQ/15ML (10%) liquid 40 mEq  40 mEq Per Tube Daily Leslye Peer, MD      . DISCONTD: potassium chloride SA (K-DUR,KLOR-CON) CR tablet 40 mEq  40 mEq Oral Once [employer]      . DISCONTD: pregabalin (  LYRICA) 200 mg  200 mg Oral BID Fred W Ortmann   200 mg at 08/06/11 1345  . DISCONTD: propofol (DIPRIVAN) 10 MG/ML infusion    PRN Gwenyth Allegra, CRNA   130 mg at 08/06/11 0831  . DISCONTD: propofol (DIPRIVAN) 10 MG/ML infusion  5-50 mcg/kg/min Intravenous Titrated Domenick Roma 2.8 mL/hr at 08/10/11 1000 5 mcg/kg/min at 08/10/11 1000  . DISCONTD: propofol (DIPRIVAN) 10 MG/ML infusion  5-70 mcg/kg/min Intravenous Continuous Anders Simmonds, NP 5.4 mL/hr at 08/20/11 0830 9 mcg/kg/min at 08/20/11 0830  . DISCONTD: propofol (DIPRIVAN) 10 MG/ML infusion  5-70 mcg/kg/min Intravenous Continuous Max Fickle, MD   6 mcg/kg/min at 08/20/11 0915  . DISCONTD: QUEtiapine (SEROQUEL) tablet 100 mg  100 mg Oral BID Mcarthur Rossetti. Feinstein   100 mg at 08/15/11 0925  . DISCONTD: QUEtiapine (SEROQUEL) tablet 200 mg  200 mg Oral BID Rakesh V. Vassie Loll, MD   200 mg at 08/16/11 1029  . DISCONTD: QUEtiapine (SEROQUEL) tablet 200 mg  200 mg Oral TID Rakesh V. Vassie Loll, MD   200 mg at 08/17/11 1034  . DISCONTD: QUEtiapine (SEROQUEL) tablet 200 mg  200 mg Oral BID Rakesh V. Vassie Loll, MD   200 mg at 08/21/11 1006  . DISCONTD: QUEtiapine (SEROQUEL) tablet 50 mg  50 mg Oral BID Anders Simmonds, NP   50 mg at 08/14/11 0945  . DISCONTD: senna-docusate (Senokot-S) tablet 1 tablet  1 tablet Oral QHS PRN Sharma Covert      . DISCONTD: SUMAtriptan (IMITREX) tablet 100 mg  100 mg Oral Q2H PRN Sharma Covert      . DISCONTD: traZODone (DESYREL) tablet 25 mg  25 mg Oral QHS Sharma Covert      . DISCONTD: vancomycin (VANCOCIN) IVPB 1000 mg/200 mL premix  1,000  mg Intravenous Q8H Leann Trefz Poindexter, PHARMD   1,000 mg at 08/21/11 1004  . DISCONTD: vancomycin (VANCOCIN) IVPB 1000 mg/200 mL premix  1,000 mg Intravenous Q12H Reece Packer, PHARMD   1,000 mg at 08/24/11 4540  . DISCONTD: zolpidem (AMBIEN) tablet 5 mg  5 mg Oral QHS PRN Sharma Covert       Medications Prior to Admission  Medication Sig Dispense Refill  . docusate sodium (COLACE) 100 MG capsule Take 1 capsule (100 mg total) by mouth 2 (two) times daily.  30 capsule  0  . DULoxetine (CYMBALTA) 30 MG capsule Take 30 mg by mouth daily.       Marland Kitchen HYDROmorphone (DILAUDID) 2 MG tablet Take 1 tablet (2 mg total) by mouth every 4 (four) hours as needed for pain (DO NOT TAKE WITH OTHER NARCOTICS TO INCLUDE NORCO).  30 tablet  0  . methocarbamol (ROBAXIN) 500 MG tablet Take 1 tablet (500 mg total) by mouth 4 (four) times daily.  30 tablet  0  . omeprazole (PRILOSEC) 20 MG capsule Take 20 mg by mouth 3 (three) times daily as needed. For indigestion.      . Pediatric Multivit-Minerals-C (FLINTSTONES GUMMIES PLUS PO) Take 1 tablet by mouth daily.        . SUMAtriptan (IMITREX) 100 MG tablet Take 100 mg by mouth every 2 (two) hours as needed.        . traZODone (DESYREL) 50 MG tablet Take 25-50 mg by mouth at bedtime as needed. For sleep.      Marland Kitchen HYDROcodone-acetaminophen (NORCO) 5-325 MG per tablet Take 2 tablets by mouth every 6 (six) hours as needed. For pain.  Home: Home Living Lives With: Significant other Type of Home: House Home Layout: Two level;1/2 bath on main level;Bed/bath upstairs Alternate Level Stairs-Rails: Can reach both;Left;Right (only half way up 2 rails) Alternate Level Stairs-Number of Steps: flight Home Access: Stairs to enter Entrance Stairs-Rails: None Entrance Stairs-Number of Steps: 2 Bathroom Shower/Tub: Engineer, manufacturing systems: Standard Home Adaptive Equipment: Walker - rolling;Wheelchair - manual Additional Comments: patient seemed to have  difficulty recalling any bathroom equipment she may own. She thinks about it for an increased period of time and indicated she is not sure.  Functional History: Prior Function Level of Independence: Independent with homemaking with ambulation;Independent with basic ADLs;Independent with gait Driving: Yes Functional Status:  Mobility: Bed Mobility Supine to Sit: 3: Mod assist;HOB flat Supine to Sit Details (indicate cue type and reason): assist for upper body to lift upright Sitting - Scoot to Edge of Bed: 4: Min assist Sitting - Scoot to [employer] of Bed Details (indicate cue type and reason): used pad to scoot patient forward to EOB Transfers Sit to Stand: 3: Mod assist;From elevated surface;With upper extremity assist Sit to Stand Details (indicate cue type and reason): pulling up on bar on [place] Stand to Sit: 3: Mod assist;To chair/3-in-1 Stand to Sit Details: assist for controlled lowering Squat Pivot Transfers: 1: +2 Total assist Squat Pivot Transfer Details (indicate cue type and reason): pt=20-25%, bed<>BSC Transfer via Lift Equipment: Stedy Ambulation/Gait Ambulation/Gait: No (unable at this time due to severe weakness)    ADL: ADL Grooming: Simulated;Wash/dry face;Moderate assistance Where Assessed - Grooming: Supine, head of bed up Upper Body Bathing: Simulated;Maximal assistance Upper Body Bathing Details (indicate cue type and reason): pt with significant pain in left elbow and unable to use extremity to help with bathing and having to support self in sitting EOB with right upper extremity on rail. Requires assist for balance to let go of rail and use right upper extremity. Where Assessed - Upper Body Bathing: Sitting, bed;Unsupported Lower Body Bathing: +2 Total assistance;Simulated;Comment for patient %;Other (comment) (30%) Where Assessed - Lower Body Bathing: Sit to stand from bed Upper Body Dressing: Simulated;+1 Total assistance Where Assessed - Upper Body Dressing:  Sitting, bed;Unsupported Lower Body Dressing: +1 Total assistance Lower Body Dressing Details (indicate cue type and reason): with socks Where Assessed - Lower Body Dressing: Sitting, bed;Unsupported Toilet Transfer: +2 Total assistance;Comment for patient %;Other (comment) (less than 20%) Toilet Transfer Details (indicate cue type and reason): stand pivot to chair. Attempted to stand with RW but unable to support self in standing with RW. Very weak in legs. Toilet Transfer Method: Stand pivot Toileting - Clothing Manipulation: +2 Total assistance;Comment for patient %;Other (comment) (0) Where Assessed - Toileting Clothing Manipulation: Standing Toileting - Hygiene: +2 Total assistance;Comment for patient %;Other (comment) (0) Where Assessed - Toileting Hygiene: Standing Tub/Shower Transfer: Not assessed Tub/Shower Transfer Method: Not assessed  Cognition: Cognition Overall Cognitive Status: Impaired Arousal/Alertness: Awake/alert Orientation Level: Oriented X4 Behaviors: Restless Comments: Difficult to fully assess due to trach. Cognition Arousal/Alertness: Awake/alert Overall Cognitive Status: Difficult to assess (had difficulty answering some questions. Increased time.) Orientation Level: Oriented X4  Blood pressure 154/87, pulse 96, temperature 98 F (36.7 C), temperature source Oral, resp. rate 18, height 5\' 1"  (1.549 m), weight 88.5 kg (195 lb 1.7 oz), SpO2 96.00%. Physical Exam  Constitutional: She appears well-developed.  HENT:  Head: Normocephalic.  Neck: Neck supple.       Trach tube in place  Cardiovascular: Normal rate and regular rhythm.  Pulmonary/Chest: Breath sounds normal. She has no wheezes.       Trach in place with PMV.  Good phonation.  Abdominal: She exhibits no distension. There is no tenderness.  Musculoskeletal: She exhibits no edema.       Left elbow with limited arom and prom.  Couldn't extend past -60 degrees.  Left ankle only mildly tender with  passive and active range. Right knee with sleeve and is generally tender without significant swelling.   Neurological: She is alert.       Names person place and situation. Follows three-step commands.  Behavior a bit labile and impulsive.  Insight fair.    Strength grossly 4/5 upper extremities outside of ortho issues.  Lower extremity strength grossly 3/5 proximal to 4/5 distally with pain inhibition at right knee noted.  Skin:       Left elbow surgery site healing nicely  Psychiatric:       She is anxious but cooperative with exam    No results found for this or any previous visit (from the past 24 hour(s)). No results found.  Assessment/Plan: Diagnosis: Deconditioning after respiratory failure, recent radial head and distal fibula fx's; ? encephalopathy 1. Does the need for close, 24 hr/day medical supervision in concert with the patient's rehab needs make it unreasonable for this patient to be served in a less intensive setting? Yes 2. Co-Morbidities requiring supervision/potential complications: anxiety, depression 3. Due to bladder management, bowel management, safety, skin/wound care, disease management, medication administration, pain management and patient education, does the patient require 24 hr/day rehab nursing? Yes 4. Does the patient require coordinated care of a physician, rehab nurse, PT (1-2 hrs/day, 5 days/week), OT (1-2 hrs/day, 5 days/week) and SLP (1-2 hrs/day, 5 days/week) to address physical and functional deficits in the context of the above medical diagnosis(es)? Yes Addressing deficits in the following areas: balance, bathing, bowel/bladder control, cognition, dressing, endurance, grooming, locomotion, psychosocial adjustment, strength, swallowing, toileting and transferring 5. Can the patient actively participate in an intensive therapy program of at least 3 hrs of therapy per day at least 5 days per week? Yes 6. The potential for patient to make measurable gains  while on inpatient rehab is excellent 7. Anticipated functional outcomes upon discharge from inpatients are supervision to min assist PT, supervision to min assist OT, supervision to modified independent SLP 8. Estimated rehab length of stay to reach the above functional goals is: 2+ weeks 9. Does the patient have adequate social supports to accommodate these discharge functional goals? Yes 10. Anticipated D/C setting: Home 11. Anticipated post D/C treatments: HH therapy 12. Overall Rehab/Functional Prognosis: excellent  RECOMMENDATIONS: This patient's condition is appropriate for continued rehabilitative care in the following setting: CIR Patient has agreed to participate in recommended program. Yes Note that insurance prior authorization may be required for reimbursement for recommended care.  Comment: Pt is anxious to participate in CIR program.  I'm not sure how realistic she is regarding potential goals and short term outcome, however.  Rehab RN to follow up.  08/29/2011

## 2011-08-29 NOTE — Progress Notes (Addendum)
PT SEEN/EXAMINED LEFT ARM LOOKS GOOD GOOD MOBILITY OF FINGERS GOOD ELBOW MOTION FLEX AND FOREARM ROTATION LACKS FULL ELBOW EXTENSION. SUTURES REMOVED  PLAN: OT CONSULT FOR LONG ARM REMOVAL SPLINT MAY DO ACTIVE RANGE OF MOTION OF ELBOW WRIST AND FOREARM NO PASSIVE MOBILITY NO WEIGHT ON LEFT ELBOW WEAR THE SPLINT OOB FOR COMFORT DOES NOT HAVE TO WEAR CONTINUALLY WILL CHECK XRAYS TODAY  ALSO WITH LEFT DISTAL FIBULA FRACTURE AND WILL PLACE ASO ON LEFT ANKLE AND RIGHT ANKLE PER HER REQUEST AS SHE GETS BACK UP AMBULATING  I NEED TO SEE BACK IN MY OFFICE IN 2 WEEKS. CALL TO MAKE APPT I AM AVAILABLE BY PAGER AT ANYTIME SO IF THERE ARE QUESTIONS REGARDING ARM PLEASE CALL  [phone]

## 2011-08-29 NOTE — Progress Notes (Signed)
Spiritual care attempted to make contact at nurse's referral.  Pt was sleeping.  Will follow up for support at later time.

## 2011-08-29 NOTE — Plan of Care (Signed)
Problem: Phase II Progression Outcomes Goal: Discharge plan remains appropriate-arrangements made Outcome: Progressing Inpt rehab assessing pt

## 2011-08-29 NOTE — Progress Notes (Signed)
PULMONARY PROGRESS NOTE:   Patient Details:    55 yowf s/p elbow surgery 11/24 > discharged home with pain medications post operatively. Noted at home to be minimally responsive after sleeping/pain pill combo.  Adm 11/25 with aspiration pneumonia, AMS.   Lines/tubes 11/25 OETT>>>11/28 11/28 (reintubation)>>>12/6 12/6 urgent reintubation stridor cw tracheal stenosis>> 11/26 right IJ CVL>>>12/5 12/6 R PICC>>> 12/6 ENT Trach>>>12/12 change to #6 cuffless  Microbiology/Sepsis markers: 11/26 MRSA : neg 11/26 BCX2>>>neg 11/26 sputum: moderate Klebsiella oxytoca (pan sens, but resistance to Unasyn) 12/5 BAL>>>nml flora 12/6 BAL>> negative 12/6 resp culture>>>few GNR>>> 12/5 CVL tip Cx>>>NGTD>> 12/6 PCT>>>0.53 12/6 Lactic acid>>>1.1  Anti-infectives:  unasyn 11/26 (Asp)>>>11/28 clinda 11/26 (asp)>>> 11/27 11/28 rocephin (K oxytoca)>>>12/6 12/5 Vanc (line sepsis)>>>12/12 12/5 Primaxin (VAP)>>>12/13    Anti-infectives     Start     Dose/Rate Route Frequency Ordered Stop   08/21/11 2000   vancomycin (VANCOCIN) IVPB 1000 mg/200 mL premix  Status:  Discontinued        1,000 mg 200 mL/hr over 60 Minutes Intravenous Every 12 hours 08/21/11 1756 08/24/11 0840   08/18/11 0000   imipenem-cilastatin (PRIMAXIN) 500 mg in sodium chloride 0.9 % 100 mL IVPB  Status:  Discontinued        500 mg 200 mL/hr over 30 Minutes Intravenous 4 times per day 08/17/11 2225 08/25/11 1338   08/17/11 1800   vancomycin (VANCOCIN) IVPB 1000 mg/200 mL premix  Status:  Discontinued        1,000 mg 200 mL/hr over 60 Minutes Intravenous Every 8 hours 08/17/11 1707 08/21/11 1756   08/10/11 1200   cefTRIAXone (ROCEPHIN) 1 g in dextrose 5 % 50 mL IVPB  Status:  Discontinued        1 g 100 mL/hr over 30 Minutes Intravenous Every 24 hours 08/10/11 1119 08/17/11 2202   08/08/11 0300   Ampicillin-Sulbactam (UNASYN) 3 g in sodium chloride 0.9 % 100 mL IVPB  Status:  Discontinued        3 g 100 mL/hr over 60  Minutes Intravenous Every 6 hours 08/08/11 0234 08/10/11 1115   08/07/11 2330   clindamycin (CLEOCIN) IVPB 900 mg        900 mg 100 mL/hr over 30 Minutes Intravenous  Once 08/07/11 2241 08/08/11 0006          Studies/Events: 11/27 bilat ue doppler neg 12/3 agitated with WUA 12/4 bradys down with weaning attempts, tolerated Ps 5/5 12/5 reattempts weaning>>failed, agitation 12/6 extubated/reintubated, plan for trach 12/6 CT neck>> tracheal narrowing, small bilateral pleural effusions L>R, bronch 12/7 to OR for ENT Trach  Subjective:    Overnight Issues:  She is demanding dc home asap ("25 days in hospital, 55 year old at home, xmas") She states she is wallking and everything set up at home for her. RN says she is in full assist mode. PT noted 12/13 says she needs inpatient rehab. Notes says she is not mobilizing  She wants trach out too and go home asap  A bit belligerent and frustrated with her siutation   Objective:  Vital signs for last 24 hours: Temp:  [98 F (36.7 C)-98.7 F (37.1 C)] 98 F (36.7 C) (12/17 0541) Pulse Rate:  [84-96] 96  (12/17 0541) Resp:  [16-18] 18  (12/17 0541) BP: (113-154)/(81-93) 154/87 mmHg (12/17 0541) SpO2:  [95 %-97 %] 96 % (12/17 0541) Weight:  [88.5 kg (195 lb 1.7 oz)] 195 lb 1.7 oz (88.5 kg) (12/17 0541)   Intake/Output this shift:  Intake/Output Summary (Last 24 hours) at 08/29/11 1023 Last data filed at 08/29/11 0700  Gross per 24 hour  Intake    980 ml  Output   1375 ml  Net   -395 ml     Physical Exam:  Psych: Upset and frustrated with her prolonged recovery General:NAD Neuro: intact, A and Oriented x 3. Speaking through trach HEENT/Neck: trach with cap Resp: coarse bilat CVS: Tachy, regular, no m/r/g GI: soft, nontender, BS WNL, no r/g Skin: no rash Extremities: Wrapped left arm, L>R LE edema with pitting Lt arm wrap taken down. Sutures clean and dry, wound ok. Re wrapped   XRAYS:  12/13> FLUOROSCOPY FOR SWALLOWING  FUNCTION STUDY:  Fluoroscopy was provided for swallowing function study, which was  administered by a speech pathologist. Final results and recommendations  from this study are contained within the speech pathology report.   08/21/2011> PORTABLE CHEST - Findings: Stable tubular devices and basilar atelectasis left greater than right.  Small left effusion is worse.  IMPRESSION: Small left effusion.    Labs  Lab 08/27/11 0425 08/25/11 0455 08/24/11 0625  NA 137 136 136  K 3.6 3.8 3.5  CL 99 98 101  CO2 29 30 29   BUN 17 12 12   CREATININE 0.69 0.60 0.50  GLUCOSE 126* 93 133*    Lab 08/27/11 0425 08/25/11 0455 08/24/11 0625  HGB 9.7* 9.3* 8.8*  HCT 32.0* 30.6* 28.6*  WBC 4.8 6.9 7.4  PLT 473* 554* 466*     Assessment/plan:  Acute Respiratory failure with ALI and ARDS. S/P c/w aspiration event. She self extubated 11/28, and re-intubated with-in an hour. Extubated 12/6, reintubated within an hour, again. Her CXR has improved a little from 12/5.  I/O remain neg. CVP 14. Bedside Trach 12/7 (ENT); suspect large component of reintubation event due to UA obstruction/stridor. rach placement 12/7, tolerated ATC after all day 12/8 then TC 24 hours 12/9. On cuffless trach since 12/12. Moved to floor on 12/13    > on 12/17: doing well with TC on RA. Wants it out.   Plan: -t -Continue bronchodilators -try to keep negative balance - Recall  DR Jenne Pane ENT at bedside for decannulation , ? In OR (will do on 12/17)  Aspiration pneumonia/ Line sepsis -+ Klebsiella oxitoca, (intermediate sensitive to Unasyn). Rocephin started on 11/28. CVL pulled 12/5 (Cx). PICC placed 12/6.  -Added Vanc/ primaxin 12/5, plan 8 days dc vanc 12/12,12/13 all abx dc'd -Rocephin x 8 days/ dc 12/6 -follow CXR  AMS/encephalopathy, improved.  Plan:  - Decrease Seroquel 100mg  bid  12/9 - d/c clonadine 12/9 - home cymbalta 12/10 - resolving  Hypernatremia  Lab 08/27/11 0425 08/25/11 0455 08/24/11 0625  NA 137 136 136   Plan: -d/c extra water 11/30 -d/c d5w -Lasix 40 bid  Hypokalemia  Lab 08/27/11 0425 08/25/11 0455 08/24/11 0625 08/23/11 0415  K 3.6 3.8 3.5 3.4*  -replenish 12/9, received KCL today -add scheduled K since on lasix -f/u bmp in am  Anemia: no evidence of bleeding  Lab 08/27/11 0425 08/25/11 0455 08/24/11 0625  HGB 9.7* 9.3* 8.8*  Plan: -transfuse for hemoglobin less than 7 -Continue proton pump inhibitor  Elbow surgery  - Dr Melvyn Novas following -wound   Malnutrition -TF restarted -speech eval ordered 12/10 for cuff down. Redo 12/13 eating 12/14  Dispo:  -  Upset she is not home yet. Deconditoning majore issue. Not sure she will do well at home wihtout inpatient rehab but social factors pressing for her  to be home asap. Will call inpatient rehab consult and have them assess and counselher. If she still wants to go home but they advice inpatient rehab, then will have to dc her home priovided she understands risks and benefits  - PT/OT recalled 08/28/11 by PCCM   LOS: 22 days   Scott M. Kriste Basque, MD 08/29/2011, 10:23 AM

## 2011-08-29 NOTE — Progress Notes (Signed)
I met with patient at bedside and then called her daughter, Elayne Snare, to discuss patient's rehabilitation options. Patient and daughter are in agreement to admit for about two weeks of therapy in our inpatient acute rehabilitation. Annice Pih can provide assistance when patient's boyfriend, Angelene Giovanni, is working. I will pursue insurance approval in the morning, BUT will need updated P.T. And O.T. Notes tomorrow to pursue. I will contact the acute rehab office here at [facility] in the morning to request updated therapy notes. Please, call me with any questions. Pager [phone].

## 2011-08-30 ENCOUNTER — Encounter (HOSPITAL_COMMUNITY): Payer: Self-pay | Admitting: Acute Care

## 2011-08-30 DIAGNOSIS — J984 Other disorders of lung: Secondary | ICD-10-CM

## 2011-08-30 DIAGNOSIS — G9340 Encephalopathy, unspecified: Secondary | ICD-10-CM | POA: Diagnosis not present

## 2011-08-30 DIAGNOSIS — Z930 Tracheostomy status: Secondary | ICD-10-CM

## 2011-08-30 DIAGNOSIS — J690 Pneumonitis due to inhalation of food and vomit: Secondary | ICD-10-CM

## 2011-08-30 DIAGNOSIS — J9690 Respiratory failure, unspecified, unspecified whether with hypoxia or hypercapnia: Secondary | ICD-10-CM

## 2011-08-30 DIAGNOSIS — R0902 Hypoxemia: Secondary | ICD-10-CM

## 2011-08-30 DIAGNOSIS — R5381 Other malaise: Secondary | ICD-10-CM | POA: Diagnosis not present

## 2011-08-30 DIAGNOSIS — J9620 Acute and chronic respiratory failure, unspecified whether with hypoxia or hypercapnia: Secondary | ICD-10-CM

## 2011-08-30 MED ORDER — ALBUTEROL SULFATE (5 MG/ML) 0.5% IN NEBU: 2.5000 mg | INHALATION_SOLUTION | RESPIRATORY_TRACT | Status: DC | PRN

## 2011-08-30 MED ORDER — BACITRACIN ZINC 500 UNIT/GM EX OINT: TOPICAL_OINTMENT | Freq: Two times a day (BID) | CUTANEOUS | Status: DC

## 2011-08-30 MED ORDER — HYDROCODONE-ACETAMINOPHEN 5-325 MG PO TABS: 2.0000 | ORAL_TABLET | Freq: Four times a day (QID) | ORAL | Status: AC | PRN

## 2011-08-30 MED ORDER — PANTOPRAZOLE SODIUM 40 MG PO TBEC: 40.0000 mg | DELAYED_RELEASE_TABLET | Freq: Every day | ORAL | Status: DC

## 2011-08-30 MED ORDER — ACETAMINOPHEN 325 MG PO TABS: 650.0000 mg | ORAL_TABLET | ORAL | Status: AC | PRN

## 2011-08-30 MED ORDER — QUETIAPINE FUMARATE 100 MG PO TABS: 100.0000 mg | ORAL_TABLET | Freq: Two times a day (BID) | ORAL | Status: AC

## 2011-08-30 MED ORDER — SODIUM CHLORIDE 0.9 % IV SOLN: INTRAVENOUS | Status: DC

## 2011-08-30 MED ORDER — BIOTENE DRY MOUTH MT LIQD: 15.0000 mL | Freq: Four times a day (QID) | OROMUCOSAL | Status: DC

## 2011-08-30 MED ORDER — DOCUSATE SODIUM 100 MG PO CAPS: 100.0000 mg | ORAL_CAPSULE | Freq: Two times a day (BID) | ORAL | Status: AC

## 2011-08-30 MED ORDER — SODIUM CHLORIDE 0.9 % IJ SOLN: 10.0000 mL | Freq: Two times a day (BID) | INTRAMUSCULAR | Status: DC

## 2011-08-30 NOTE — Progress Notes (Signed)
Thanks for the updated P.T. And O.T. Notes. I have contacted her insurance company and await their decision. If approved, we could admit her 08/31/11. Pager [phone].

## 2011-08-30 NOTE — Progress Notes (Signed)
I have insurance approval for admission to inpatient acute rehabilitation for tomorrow. I have notified patient, her daughter Shanda Bumps, and RN case Production designer, theatre/television/film. I will make arrangements in the morning once attending MD is ready to d/c. Please call me with any questions. Pager [phone]

## 2011-08-30 NOTE — Progress Notes (Signed)
Inpatient rehab does not currently have a bed available at this time.  Will reassess in the morning. [name], [name]

## 2011-08-30 NOTE — Progress Notes (Signed)
Speech pathology  [phone]  Patient is being discharged from SLP services per [facility] order.  Note pt now with cap on trach and tolerating op diet.  Goals met, please reorder if desire.    Goals met and no further therapy needs identified.  Dollene Cleveland, MS [facility] SLP [phone]

## 2011-08-30 NOTE — Progress Notes (Signed)
CSW reviewed chart and spoke with CIR rep Ottie Glazier re. Plans for Pt. Per her report, they need OT to evaluate Pt and once complete they will pursue insurance authorization. OT to see Pt today. CSW is hopeful Pt can go to CIR in near future.  CSW will in case another plan needs to be pursued.  Vennie Homans, [place] 08/30/2011 10:58 AM [phone]

## 2011-08-30 NOTE — Discharge Summary (Signed)
Physician Discharge Summary  Patient ID: [name] MRN: [id number] DOB/AGE: October 11, 1955 55 y.o.  Admit date: 08/07/2011 Discharge date: 08/30/2011  Admission Diagnoses: Acute respiratory failure  Discharge Diagnoses:  Active Problems:  Radial head fracture, closed  Stridor  Respiratory failure  Aspiration pneumonia  Hypoxia  Tracheostomy dependent  Encephalopathy acute  ANXIETY  DEPRESSION  Physical deconditioning    Lines/tubes  11/25 OETT>>>11/28  11/28 (reintubation)>>>12/6  12/6 urgent reintubation stridor cw tracheal stenosis>>  11/26 right IJ CVL>>>12/5  12/6 R PICC>>>  12/6 ENT Trach>>>12/12 change to #6 cuffless   Microbiology/Sepsis markers:  11/26 MRSA : neg  11/26 BCX2>>>neg  11/26 sputum: moderate Klebsiella oxytoca (pan sens, but resistance to Unasyn)  12/5 BAL>>>nml flora  12/6 BAL>> negative  12/6 resp culture>>>neg 12/5 CVL tip Cx>>>neg  12/6 PCT>>>0.53  12/6 Lactic acid>>>1.1   Anti-infectives:  unasyn 11/26 (Asp)>>>11/28  clinda 11/26 (asp)>>> 11/27  11/28 rocephin (K oxytoca)>>>12/6  12/5 Vanc (line sepsis)>>>12/12  12/5 Primaxin (VAP)>>>12/13   Studies/Events:  11/27 bilat ue doppler neg  12/6 extubated/reintubated, plan for trach  12/6 CT neck>> tracheal narrowing, small bilateral pleural effusions L>R, bronch  12/7 to OR for ENT Trach: Sara Pane (see hospital course below for OR report from Dr bates).   Brief history  55 yowf s/p elbow surgery 11/24 > discharged home with pain medications post operatively. Noted at home to be minimally responsive after sleeping/pain pill combo. Adm 11/25 with aspiration pneumonia, AMS.  Hospital Course:  Acute Respiratory failure with ALI and ARDS. S/P c/w aspiration event (resolved). 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 Extubated 12/6, reintubated within an hour, again. She is s/p bedside Trach 12/7 (ENT); suspect large component of reintubation event due  to UA obstruction/stridor. Trach placement 12/7, tolerated ATC after all day 12/8 then TC 24 hours 12/9. On cuffless trach since 12/12. Moved to floor on 12/13  Plan:  -Continue bronchodilators  -try to keep negative balance  -spoke with Dr [name] ENT) via phone on 12/17, these were the intra-op findings:   "Upon direct laryngoscopy, the posterior larynx was markedly edematous in the arytenoid region and the vocal folds were markedly inflamed and friable on both sides with edema. Deep to the vocal folds, the tracheostomy tube was easily seen with no upper tracheal narrowing. The flexible laryngoscope was passed then through the tracheostomy tube and there was no narrowing in the distal trachea or primary bronchi and the tube was backed out somewhat and still no narrowing seen".   -given these results his recommendation are for [name] to keep her Janina Mayo in during her Rehab stay. He would like to see [name] in his office after discharge. He will evaluate for safe timing of decannulation in the out patient setting.   Aspiration pneumonia (resolved) -Initial sputum grew out Klebsiella oxitoca, (intermediate sensitive to Unasyn). Antibiotics were changed, with eventual clinical improvement. See above for antibiotic course.  -no off all antibiotics and clinically improved.   AMS/encephalopathy (resolved)  Plan:  - DecreasedSeroquel 100mg  bid 12/9, would work on decreasing this slowly.  - d/c'd clonidine 12/9  - home cymbalta 12/10   Hypernatremia (resolved).   Lab  08/27/11 0425  08/25/11 0455  08/24/11 0625   NA  137  136  136   Plan:  -allow po intake to regulate.   Hypokalemia (resolved)   Lab  08/27/11 0425  08/25/11 0455  08/24/11 0625   K  3.6  3.8  3.5  plan:  -check PRN   Anemia: no evidence of bleeding   Lab  08/27/11 0425  08/25/11 0455  08/24/11 0625   HGB  9.7*  9.3*  8.8*   Plan:  -transfuse for hemoglobin less than 7  -Continue proton pump  inhibitor   Elbow surgery  - Dr Melvyn Novas following  - PT OT in in-pt rehab setting   Malnutrition  -encourage po intake.   Deconditioning Plan: In-patient rehab.   Discharge Exam: No data found.  Physical Exam:  Psych: appropriate and hopeful today. Looking forward to going to rehab in am.  General:NAD  Neuro: intact, A and Oriented x 3. Speaking through trach  HEENT/Neck: trach with cap  Resp: coarse bilat  CVS: Tachy, regular, no m/r/g  GI: soft, nontender, BS WNL, no r/g  Skin: no rash  Extremities: Wrapped left arm, L>R LE edema with pitting  Lt arm wrap taken down. Sutures clean and dry, wound ok. Re wrapped   Labs at discharge Lab Results  Component Value Date   CREATININE 0.69 08/27/2011   BUN 17 08/27/2011   NA 137 08/27/2011   K 3.6 08/27/2011   CL 99 08/27/2011   CO2 29 08/27/2011   Lab Results  Component Value Date   WBC 4.8 08/27/2011   HGB 9.7* 08/27/2011   HCT 32.0* 08/27/2011   MCV 75.8* 08/27/2011   PLT 473* 08/27/2011   Lab Results  Component Value Date   ALT 17 08/11/2011   AST 29 08/11/2011   ALKPHOS 95 08/11/2011   BILITOT 0.5 08/11/2011   No results found for this basename: INR,  PROTIME    Current radiology studies Dg Elbow 2 Views Left  08/29/2011  *RADIOLOGY REPORT*  Clinical Data: Postop  LEFT ELBOW - 2 VIEW  Comparison: Preop elbow films of 07/30/2011  Findings: A metallic fixation device is present through the radial head.  A smaller fixation device is noted within the capitellum. Bone fragments from prior trauma are noted adjacent to the radial neck.  IMPRESSION: Postop changes for repair of radial head fracture.  Original Report Authenticated By: Juline Patch, M.D.    Disposition: [facility] in-patient rehab.   Discharge Orders    Future Orders Please Complete By Expires   Diet - low sodium heart healthy      Increase activity slowly        Current Discharge Medication List    START taking these medications   Details    acetaminophen (TYLENOL) 325 MG tablet Take 2 tablets (650 mg total) by mouth every 4 (four) hours as needed for fever. Qty: 30 tablet    albuterol (PROVENTIL) (5 MG/ML) 0.5% nebulizer solution Take 0.5 mLs (2.5 mg total) by nebulization every 4 (four) hours as needed for wheezing or shortness of breath. Qty: 20 mL    antiseptic oral rinse (BIOTENE) LIQD 15 mLs by Mouth Rinse route QID.    bacitracin ointment Apply topically 2 (two) times daily. Qty: 120 g    pantoprazole (PROTONIX) 40 MG tablet Take 1 tablet (40 mg total) by mouth daily at 12 noon.    QUEtiapine (SEROQUEL) 100 MG tablet Take 1 tablet (100 mg total) by mouth 2 (two) times daily.    sodium chloride 0.9 % infusion kvo NS    sodium chloride 0.9 % injection 10 mLs by Intracatheter route every 12 (twelve) hours. Qty: 5 mL      CONTINUE these medications which have CHANGED   Details  docusate sodium (COLACE) 100  MG capsule Take 1 capsule (100 mg total) by mouth 2 (two) times daily. Qty: 30 capsule, Refills: 0    HYDROcodone-acetaminophen (NORCO) 5-325 MG per tablet Take 2 tablets by mouth every 6 (six) hours as needed. For pain. Qty: 30 tablet      CONTINUE these medications which have NOT CHANGED   Details  DULoxetine (CYMBALTA) 30 MG capsule Take 30 mg by mouth daily.       STOP taking these medications     HYDROmorphone (DILAUDID) 2 MG tablet      methocarbamol (ROBAXIN) 500 MG tablet      ibuprofen (ADVIL,MOTRIN) 800 MG tablet      naloxone (NARCAN) 1 MG/ML injection      omeprazole (PRILOSEC) 20 MG capsule      Ondansetron HCl (ZOFRAN) 2 MG/ML SOLN injection      Pediatric Multivit-Minerals-C (FLINTSTONES GUMMIES PLUS PO)      pregabalin (LYRICA) 100 MG capsule      SUMAtriptan (IMITREX) 100 MG tablet      traZODone (DESYREL) 50 MG tablet          Discharged Condition: good  Signed: Orvan Papadakis,PETE 08/30/2011, 4:22 PM

## 2011-08-30 NOTE — Progress Notes (Signed)
Physical Therapy Treatment Patient Details Name: [name] MRN: [id number] DOB: November 30, 1955 Today's Date: 08/29/2011 [id number] 1TA, 1GT PT Assessment/Plan  PT - Assessment/Plan Comments on Treatment Session: Patient much improved with transfers and able to ambulate today with LE orthoses (right knee brace, bilateral ASO's)  Still great rehab candidate with progress and tolerance to treatment. PT Plan: Discharge plan remains appropriate PT Frequency: Min 4X/week Follow Up Recommendations: Inpatient Rehab Equipment Recommended: Defer to next venue PT Goals  Acute Rehab PT Goals Pt will Sit at [facility] of Bed: with supervision;3-5 min;with unilateral upper extremity support PT Goal: Sit at Edge Of Bed - Progress: Met Pt will go Sit to Stand: with mod assist PT Goal: Sit to Stand - Progress: Met Pt will Stand: with mod assist;1 - 2 min PT Goal: Stand - Progress: Partly met Pt will Ambulate: 16 - 50 feet;with least restrictive assistive device;with min assist (50 feet) PT Goal: Ambulate - Progress: Revised (modified due to lack of progress/goal met) (new goal this session)  PT Treatment Precautions/Restrictions  Precautions Precautions: Fall Required Braces or Orthoses: Yes Other Brace/Splint: ASO's to bilateral ankles Restrictions Weight Bearing Restrictions: No Mobility (including Balance) Transfers Transfers: Yes Sit to Stand: 3: Mod assist;From bed;With upper extremity assist;From elevated surface Sit to Stand Details (indicate cue type and reason): cues for forward weight shift with elbows on b/s table   performed 10 reps Stand to Sit: 4: Min assist;To bed Stand to Sit Details: uncontrolled descent despite cues  Ambulation/Gait Ambulation/Gait: Yes Ambulation/Gait Assistance: 3: Mod assist Ambulation/Gait Assistance Details (indicate cue type and reason): pt's right arm over PT shoulder, 5 feet forward, then reverse Ambulation Distance (Feet): 5 Feet Assistive device:  1 person hand held assist Gait Pattern: Decreased stride length;Trunk flexed    Exercise    End of Session PT - End of Session Equipment Utilized During Treatment: Gait belt Activity Tolerance: Patient tolerated treatment well;Patient limited by pain (pt. awaiting food tray, didn't eat awaiting trach out today) Patient left: in bed (seated on edge of bed) General Behavior During Session: [facility] for tasks performed Cognition: [facility] for tasks performed  [facility] 08/30/2011, 9:01 AM

## 2011-08-30 NOTE — PMR Pre-admission (Signed)
PMR Admission Coordinator Pre-Admission Assessment  Patient:  [name] is an 55 y.o., female MRN:  [id number] DOB:  1956-04-29 Height:   (5'4") Weight:  84.1 kg (185 lb 6.5 oz) (bed scale)  Insurance Information: HMO:    PPO:     PCP:     IPA:     80/20:     OTHER: PRIMARY:[employer]      Policy#:[id number]      Subscriber:pt CM Name:Veronica      Phone#:[phone]     GEX#:[phone] Cert for 7 days. Need update on 09/07/11. Call [place] and fax update . Her phone number is [phone]. Pre-Cert#:[id number]      Employer:[employer] Benefits:  Phone #[phone]     Name:Loius 08/29/11 Eff. Date:09/20/2003     Deduct:$400      Out of Pocket Max:$6000      Life GLO:[id number] CIR:90% days medically necessary      SNF:90% 15 days per year Outpatient:90%     Co-Pay:26 visits combined/year...4 visits remaining this year Home Health:90%      Co-Pay:same 26 visits per year combined/ Calender year to year DME:90%     Co-Pay:10% Providers:in network  SECONDARY:none      Medicaid Application Date:I discussed with pt the need to apply for longterm disability. She says she has discussed with her pain doctor, Dr. Haskel Khan in [place]       Disability Application Date:      Case Worker:  Current Medical History:   Patient Admitting Diagnosis:  Deconditioning after respiratory failure, recent radial head and distal fibula fxs, encephalopathy  History of Present Illness: Admitted for ORIF of displaced radial head fx. Discharged home. Found down at home by significant other after found unresponsive. Had taken pain med and sleeping med and found unresponsive. Intubated in the ER due to progressive respiratory distress and felt precipitated by aspiration event. Readmitted with respiratory failure and treated for aspiration pneumonia. Ultimately received Trach #6 on 12/7. Using PMV now. Overall mental status improved but suggesting encephalopathy. She is non weight bearing to left  upper extremity and left lower extremity with cam boot and WBAT.  Patients Past Medical History:   Past Medical History  Diagnosis Date  . Ovarian cyst   . Pneumonia     hx x4  . Cancer     precancerous cervix  . GERD (gastroesophageal reflux disease)   . Headache   . Hiatal hernia   . PONV (postoperative nausea and vomiting) 08/06/11  . Difficult intubation   . Aspiration pneumonia   . Respiratory failure    Family Medical History:  family history is not on file.    Patients Current Diet: General  Prior Rehab/Hospitalizations: outpatient therapy only  Medications  Current Medications: Current facility-administered medications:0.9 %  sodium chloride infusion, , Intravenous, Continuous, Rakesh V. Vassie Loll, MD, Last Rate: 20 mL/hr at 08/29/11 1845;  acetaminophen (TYLENOL) tablet 650 mg, 650 mg, Oral, Q4H PRN, Kalman Shan, MD, 650 mg at 08/28/11 2003;  albuterol (PROVENTIL) (5 MG/ML) 0.5% nebulizer solution 2.5 mg, 2.5 mg, Nebulization, Q4H PRN, Billy Fischer, MD, 2.5 mg at 08/18/11 0759 antiseptic oral rinse (BIOTENE) solution 15 mL, 15 mL, Mouth Rinse, QID, Sandrea Hughs, MD, 15 mL at 08/31/11 0400;  bacitracin ointment, , Topical, BID, Dwight D Bates;  DULoxetine (CYMBALTA) DR capsule 30 mg, 30 mg, Oral, Daily, Leslye Peer, MD, 30 mg at 08/30/11 1000;  furosemide (LASIX) tablet 40 mg, 40 mg, Oral, Daily, Vilinda Blanks Minor, NP, 40 mg  at 08/30/11 1000 pantoprazole (PROTONIX) EC tablet 40 mg, 40 mg, Oral, Q1200, Max Fickle, MD, 40 mg at 08/30/11 1138;  potassium chloride 20 MEQ/15ML (10%) liquid 40 mEq, 40 mEq, Oral, Daily, Berkley Harvey, [place], 40 mEq at 08/28/11 1026;  promethazine (PHENERGAN) injection 12.5 mg, 12.5 mg, Intravenous, Q4H PRN, Vilinda Blanks Minor, NP;  QUEtiapine (SEROQUEL) tablet 100 mg, 100 mg, Oral, BID, Leslye Peer, MD, 100 mg at 08/30/11 2219 sodium chloride 0.9 % injection 10 mL, 10 mL, Intracatheter, Q12H, [name] Grout, MD, 10 mL at 08/25/11 1002;   sodium chloride 0.9 % injection 10 mL, 10 mL, Intracatheter, PRN, [name] Grout, MD, 10 mL at 08/29/11 1955;  DISCONTD: chlorhexidine (PERIDEX) 0.12 % solution 15 mL, 15 mL, Mouth Rinse, BID, Sandrea Hughs, MD, 15 mL at 08/30/11 1000 Facility-Administered Medications Ordered in Other Encounters: ceFAZolin (ANCEF) IVPB 1 g/50 mL premix, , , PRN, Gwenyth Allegra, CRNA, 1,000 mg at 08/07/11 0341;  fentaNYL (SUBLIMAZE) injection, , , PRN, Gwenyth Allegra, CRNA, 100 mcg at 08/06/11 0831;  glycopyrrolate (ROBINUL) injection, , , PRN, Deatra Robinson Mahony, 0.6 mg at 08/06/11 1027;  lactated ringers infusion, , , Continuous PRN, Gwenyth Allegra, CRNA midazolam (VERSED) 5 MG/5ML injection, , , PRN, Gwenyth Allegra, CRNA, 2 mg at 08/06/11 0815;  neostigmine (PROSTIGMINE) injection, , Intravenous, PRN, Deatra Robinson Mahony, 3 mg at 08/06/11 1027;  ondansetron (ZOFRAN) injection, , , PRN, Deatra Robinson Mahony, 4 mg at 08/06/11 1022;  phenylephrine (NEO-SYNEPHRINE) injection, , , PRN, Deatra Robinson Mahony, 80 mcg at 08/06/11 0916 rocuronium (ZEMURON) injection, , , PRN, Gwenyth Allegra, CRNA, 50 mg at 08/06/11 1610  Precautions/Special Needs:    Additional Precautions/Restrictions: Precautions Precautions: Fall Required Braces or Orthoses: Yes Other Brace/Splint: has bilateral ASO's for OOB activity Restrictions Weight Bearing Restrictions: Yes LUE Weight Bearing: Non weight bearing Other Position/Activity Restrictions: No weight through left elbow  Therapy Assessments  Prior Function Level of Independence: Independent with homemaking with ambulation;Independent with basic ADLs;Independent with gait Driving: Yes 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) Comments:  (Chronic pain issues, r knee injury jan.2012)    Occupational Therapy: Precautions Precautions: Fall Required Braces or Orthoses: Yes Other Brace/Splint: has bilateral ASO's for OOB activity   Home Living Lives  With:  (boyfriend , Angelene Giovanni of 4 years) Actor Help From:  (Daughter, Annice Pih, and Corporate treasurer) Type of Home: House Home Layout: Two level;1/2 bath on main level;Bed/bath upstairs Alternate Level Stairs-Rails: Can reach both;Left;Right (only half way up 2 rails) Alternate Level Stairs-Number of Steps: flight Home Access:  (two) Entrance Stairs-Rails: None Entrance Stairs-Number of Steps: 2 Bathroom Shower/Tub: Engineer, manufacturing systems: Standard Bathroom Accessibility: Yes How Accessible: Accessible via walker Home Adaptive Equipment: Walker - rolling;Wheelchair - manual Additional Comments: patient seemed to have difficulty recalling any bathroom equipment she may own. She thinks about it for an increased period of time and indicated she is not sure. 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 Pt did much better with sitting balance and tolerance at EOB. Where Assessed - Upper Body Bathing: Unsupported;Sitting, bed Lower Body Bathing: Performed;Moderate assistance Lower Body Bathing Details (indicate cue type and reason): attempted to stand to wash periareas to facilitate standing tolerance/balance but pt became warm and dizzy. Sat back down and performed rest of LB bathing supine. Pt min  assist for sit to stand. Pt reports discomfort in bottom crease area. Note redness. Notified nursing who applied barrier cream. Where Assessed - Lower Body Bathing: Rolling right and/or left;Sit to stand from bed Upper Body Dressing: Simulated;Moderate assistance Upper Body Dressing Details (indicate cue type and reason): simulated with gown Where Assessed - Upper Body Dressing: Supine, head of bed flat Lower Body Dressing: Performed;Maximal assistance Lower Body Dressing Details (indicate cue type and reason): assisted pt to remove ASOs as pt complaining they are making her feet sweat. Note several small red areas on front of left lower leg. Notified nursing. Left ASOs off to remove pressure to these areas. total assist with ASOs to doff as they are very snuggly tied. Worked on donning slippers EOB to stand. After pt had to return supine from feeling warm and dizzy, worked on  donning pj bottoms from bed level and rolling to pull up.  Where Assessed - Lower Body Dressing: Sitting, bed;Unsupported;Rolling right and/or left Toilet Transfer: +2 Total assistance;Comment for patient %;Other (comment) (less than 20%) Toilet Transfer Details (indicate cue type and reason): stand pivot to chair. Attempted to stand with RW but unable to support self in standing with RW. Very weak in legs. Toilet Transfer Method: Stand pivot Toileting - Clothing Manipulation: +2 Total assistance;Comment for patient %;Other (comment) (0) Where Assessed - Toileting Clothing Manipulation: Standing Toileting - Hygiene: +2 Total assistance;Comment for patient %;Other (comment) (0) Where Assessed - Toileting Hygiene: Standing Tub/Shower Transfer: Not assessed Tub/Shower Transfer Method: Not assessed ADL Comments: Pt able to answer a simple question through vocal response within 3 seconds related to her ADL  SLP Recommendations: Equipment Recommended: Defer to next venue Recommended Consults: MBS General Recommendation: NPO  except meds;Ice chips PRN after oral care;Free water protocol after oral care Solid Consistency: Regular Liquid Consistency: Thin Liquid Administration via: Cup Medication Administration: Whole meds with liquid Supervision: Patient able to self feed Compensations: Slow rate;Small sips/bites Postural Changes and/or Swallow Maneuvers: Out of bed for meals;Seated upright 90 degrees Oral Care Recommendations: Oral care QID;Patient independent with oral care Other Recommendations: Place PMSV during PO intake;Clarify dietary restrictions  Prior Function: Level of Independence: Independent with homemaking with ambulation;Independent with basic ADLs;Independent with gait Driving: Yes 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 Pt did much better with sitting balance and tolerance at EOB. Where Assessed - Upper Body Bathing: Unsupported;Sitting, bed Lower Body Bathing: Performed;Moderate assistance Lower Body Bathing Details (indicate cue type and reason): attempted to stand to wash periareas to facilitate standing tolerance/balance but pt became warm and dizzy. Sat back down and performed rest of LB bathing supine. Pt min assist for sit to stand. Pt reports discomfort in bottom crease area. Note redness. Notified nursing who applied barrier cream. Where Assessed - Lower Body  Bathing: Rolling right and/or left;Sit to stand from bed Upper Body Dressing: Simulated;Moderate assistance Upper Body Dressing Details (indicate cue type and reason): simulated with gown Where Assessed - Upper Body  Dressing: Supine, head of bed flat Lower Body Dressing: Performed;Maximal assistance Lower Body Dressing Details (indicate cue type and reason): assisted pt to remove ASOs as pt complaining they are making her feet sweat. Note several small red areas on front of left lower leg. Notified nursing. Left ASOs off to remove pressure to these areas. total assist with ASOs to doff as they are very snuggly tied. Worked on donning slippers EOB to stand. After pt had to return supine from feeling warm and dizzy, worked on  donning pj bottoms from bed level and rolling to pull up.  Where Assessed - Lower Body Dressing: Sitting, bed;Unsupported;Rolling right and/or left Toilet Transfer: +2 Total assistance;Comment for patient %;Other (comment) (less than 20%) Toilet Transfer Details (indicate cue type and reason): stand pivot to chair. Attempted to stand with RW but unable to support self in standing with RW. Very weak in legs. Toilet Transfer Method: Stand pivot Toileting - Clothing Manipulation: +2 Total assistance;Comment for patient %;Other (comment) (0) Where Assessed - Toileting Clothing Manipulation: Standing Toileting - Hygiene: +2 Total assistance;Comment for patient %;Other (comment) (0) Where Assessed - Toileting Hygiene: Standing Tub/Shower Transfer: Not assessed Tub/Shower Transfer Method: Not assessed ADL Comments: Pt able to answer a simple question through vocal response within 3 seconds related to her ADL  Additional Prior Functional Levels:   Independent  Prior Activity Level: Community (5-7x/wk): active until injury 1 month pta  ADLs/Mobility: ADL Grooming: Simulated;Wash/dry face;Moderate assistance Where Assessed - Grooming: Supine, head of bed up Upper Body Bathing: Performed;Chest;Right arm;Left arm;Abdomen;Moderate assistance Upper Body Bathing Details (indicate cue type and reason): Needed assist to lift breasts so she can wash under breasts using right UE. Pt did much better  with sitting balance and tolerance at EOB. Where Assessed - Upper Body Bathing: Unsupported;Sitting, bed Lower Body Bathing: Performed;Moderate assistance Lower Body Bathing Details (indicate cue type and reason): attempted to stand to wash periareas to facilitate standing tolerance/balance but pt became warm and dizzy. Sat back down and performed rest of LB bathing supine. Pt min assist for sit to stand. Pt reports discomfort in bottom crease area. Note redness. Notified nursing who applied barrier cream. Where Assessed - Lower Body Bathing: Rolling right and/or left;Sit to stand from bed Upper Body Dressing: Simulated;Moderate assistance Upper Body Dressing Details (indicate cue type and reason): simulated with gown Where Assessed - Upper Body Dressing: Supine, head of bed flat Lower Body Dressing: Performed;Maximal assistance Lower Body Dressing Details (indicate cue type and reason): assisted pt to remove ASOs as pt complaining they are making her feet sweat. Note several small red areas on front of left lower leg. Notified nursing. Left ASOs off to remove pressure to these areas. total assist with ASOs to doff as they are very snuggly tied. Worked on donning slippers EOB to stand. After pt had to return supine from feeling warm and dizzy, worked on  donning pj bottoms from bed level and rolling to pull up.  Where Assessed - Lower Body Dressing: Sitting, bed;Unsupported;Rolling right and/or left Toilet Transfer: +2 Total assistance;Comment for patient %;Other (comment) (less than 20%) Toilet Transfer Details (indicate cue type and reason): stand pivot to chair. Attempted to stand with RW but unable to support self in standing with RW. Very weak in legs. Toilet Transfer Method: Stand pivot Toileting -  Clothing Manipulation: +2 Total assistance;Comment for patient %;Other (comment) (0) Where Assessed - Toileting Clothing Manipulation: Standing Toileting - Hygiene: +2 Total assistance;Comment for  patient %;Other (comment) (0) Where Assessed - Toileting Hygiene: Standing Tub/Shower Transfer: Not assessed Tub/Shower Transfer Method: Not assessed ADL Comments: Pt able to answer a simple question through vocal response within 3 seconds related to her ADL  Bed Mobility Supine to Sit: 3: Mod assist;HOB flat Supine to Sit Details (indicate cue type and reason): assist for upper body to lift upright Sitting - Scoot to Edge of Bed: 4: Min assist Sitting - Scoot to [place] of Bed Details (indicate cue type and reason): used pad to scoot patient forward to EOB Transfers Transfers: Yes Sit to Stand: 4: Min assist;With upper extremity assist;From elevated surface Sit to Stand Details (indicate cue type and reason): cues for forward weight shift with elbows on b/s table   performed 10 reps Stand to Sit: 4: Min assist;To bed Stand to Sit Details: uncontrolled descent despite cues  Squat Pivot Transfers: 1: +2 Total assist Squat Pivot Transfer Details (indicate cue type and reason): pt=20-25%, bed<>BSC Transfer via Lift Equipment: Stedy Ambulation/Gait Ambulation/Gait: Yes Ambulation/Gait Assistance: 3: Mod assist Ambulation/Gait Assistance Details (indicate cue type and reason): pt's right arm over PT shoulder, 5 feet forward, then reverse Ambulation Distance (Feet): 5 Feet Assistive device: 1 person hand held assist Gait Pattern: Decreased stride length;Trunk flexed Static Sitting Balance Static Sitting - Balance Support: Right upper extremity supported Static Sitting - Level of Assistance: 6: Modified independent (Device/Increase time) Static Sitting - Comment/# of Minutes: sat for approx 8 minutes while PT brushing hair to get out tangles Dynamic Sitting Balance Dynamic Sitting - Balance Support: Right upper extremity supported;During functional activity (lat lean to scoot, get gown out; forward for peri hygiene) Dynamic Sitting - Level of Assistance: 5: Stand by assistance;3: Mod assist  (supervision for lateral lean on bed, mod assist for forward ) Dynamic Sitting - Balance Activities: Lateral lean/weight shifting;Forward lean/weight shifting  Home Assistive Devices/Equipment:  Home Assistive Devices/Equipment Home Assistive Devices/Equipment: None  Discharge Planning:  Living Arrangements: Spouse/significant other (Lives with boyfriend and ) Support Systems: Spouse/significant other;Children Assistance Needed: family has not discussed needing additional assistance Do you have any problems obtaining your medications?: No Type of Residence: Private residence Home Care Services: No Patient expects to be discharged to:: home Case Management Consult Needed: Yes (Comment)  Current Functional Levels:  Communication:  (PMV with #6 trach) Previous Home Environment:  Living Arrangements: Spouse/significant other (Lives with boyfriend and ) Support Systems: Spouse/significant other;Children Assistance Needed: family has not discussed needing additional assistance Do you have any problems obtaining your medications?: No Type of Residence: Private residence Home Care Services: No Patient expects to be discharged to:: home Home Environment Number of Levels:  (2 level) Previous Home Environment Number of Steps:  (2) Previous Home Environment Is Bedroom on Main Floor?: No (but can bring bed downstairs) Previous Home Environment Is Bathroom on Main Floor?: Yes (1/2 bath)  Discharge Living Setting:  Plans for Discharge Living Setting: Lives with (comment) (rodger, boyfriend) Discharge Living Setting Number of Levels:  (two level) Discharge Living Setting Number of Steps:  (2 steps) Discharge Living Setting is Bedroom on Main Floor?: No Discharge Living Setting is Bathroom on Main Floor?: Yes  Social/Family/Support Systems:  Patient Roles: Partner;Parent (53 year old son, Lysle Morales) Solicitor Information: Corporate treasurer and daughter, Elayne Snare Anticipated Caregiver: Angelene Giovanni and  Annice Pih Anticipated Caregiver's Contact Information: Elayne Snare cell is [phone] Ability/Limitations of Caregiver: Supervision to min assist Caregiver Availability: 24/7 (when Angelene Giovanni works, Conservation officer, nature can come over and help) Discharge Plan Discussed with Primary Caregiver: Yes Is Caregiver In Agreement with Plan?: Yes Does Caregiver/Family have Issues with Lodging/Transportation while Pt is in Rehab?: No  Goals/Additional Needs:  Patient/Family Goal for Rehab: supervision to min asssist with P.T. and O.T., Supervision with SLP Special Service Needs:  (Patient with chronic pain issues since 1987) Pt/Family Agrees to Admission and willing to participate: Yes Program Orientation Provided & Reviewed with Pt/Caregiver Including Roles  & Responsibilities: Yes  Preadmission Screen Completed By:  Clois Dupes, 08/31/2011 7:40 AM  Patient's condition:  Please see physician update to information in consult dated 08/19/11.  Preadmission Screen Competed by:Ottie Glazier, RN, Time/Date,0740 on 08/31/2011.  Discussed status with Dr. Riley Kill  On 08/31/11 at 0740 (time/date) and received telephone approval for admission today.  Admission Coordinator:  Clois Dupes, [id number] /Date12/19/2012.

## 2011-08-30 NOTE — Progress Notes (Signed)
Is there plans to decannulate patient prior to patient discharge home? I need clarification for patient family  Education purposes.  I have contacted therapy office to request updated therapy treatment notes to begin insurance precertification for an admit to inpatient rehabilitation prior to patient's discharge home with family. Please call me with any questions. Pager [phone].

## 2011-08-30 NOTE — Progress Notes (Signed)
PULMONARY PROGRESS NOTE:   Patient Details:    72 yowf s/p elbow surgery 11/24 > discharged home with pain medications post operatively. Noted at home to be minimally responsive after sleeping/pain pill combo.  Adm 11/25 with aspiration pneumonia, AMS.   Lines/tubes 11/25 OETT>>>11/28, 11/28 (reintubation)>>>12/6, 12/6 urgent reintubation stridor cw tracheal stenosis>>12/6 ENT Trach (reported as tear and not stenossis)>>>12/12 change to #6 cuffless  11/26 right IJ CVL>>>12/5  12/6 R PICC>>>   Microbiology/Sepsis markers: 11/26 MRSA : neg 11/26 BCX2>>>neg 11/26 sputum: moderate Klebsiella oxytoca (pan sens, but resistance to Unasyn) 12/5 BAL>>>nml flora 12/6 BAL>> negative 12/6 resp culture>>>neg 12/5 CVL tip Cx>>>neg 12/6 PCT>>>0.53 12/6 Lactic acid>>>1.1  Anti-infectives:  unasyn 11/26 (Asp)>>>11/28 clinda 11/26 (asp)>>> 11/27 11/28 rocephin (K oxytoca)>>>12/6 12/5 Vanc (line sepsis)>>>12/12 12/5 Primaxin (VAP)>>>12/13   Studies/Events: 11/27 bilat ue doppler neg 12/3 agitated with WUA 12/4 bradys down with weaning attempts, tolerated Ps 5/5 12/5 reattempts weaning>>failed, agitation 12/6 extubated/reintubated, plan for trach 12/6 CT neck>> tracheal narrowing, small bilateral pleural effusions L>R, bronch 12/7 to OR for ENT Trach: Jenne Pane 12/17 Dr Jenne Pane states trach will be decannulated in due course in his office 12/18 Has agreed to 2 weeks inpatient rehab  Subjective:    Overnight Issues:  She prefers going home instead of inpatient rehab though has agreed to go to inpatient rehab for 2 weeks  Per chart. Bed Not available at inpatient rehab. Insurance approved  Objective:  Vital signs for last 24 hours: Temp:  [98.4 F (36.9 C)-99.1 F (37.3 C)] 98.7 F (37.1 C) (12/18 1322) Pulse Rate:  [91-107] 98  (12/18 1322) Resp:  [16-18] 18  (12/18 1322) BP: (141-149)/(83-88) 149/86 mmHg (12/18 1322) SpO2:  [96 %-98 %] 96 % (12/18 1322) Weight:  [84.1 kg (185 lb 6.5  oz)] 185 lb 6.5 oz (84.1 kg) (12/18 0500)   Intake/Output this shift:  Intake/Output Summary (Last 24 hours) at 08/30/11 1550 Last data filed at 08/30/11 1323  Gross per 24 hour  Intake    800 ml  Output    350 ml  Net    450 ml     Physical Exam:  Psych: appropriate and hopeful today. Looking forward to going to rehab in am.  General:NAD Neuro: intact, A and Oriented x 3. Speaking through trach HEENT/Neck: trach with cap Resp: coarse bilat CVS: Tachy, regular, no m/r/g GI: soft, nontender, BS WNL, no r/g Skin: no rash Extremities: Wrapped left arm, L>R LE edema with pitting Lt arm wrap taken down. Sutures clean and dry, wound ok. Re wrapped   XRAYS:  12/13> FLUOROSCOPY FOR SWALLOWING FUNCTION STUDY:  Fluoroscopy was provided for swallowing function study, which was  administered by a speech pathologist. Final results and recommendations  from this study are contained within the speech pathology report.   08/21/2011> PORTABLE CHEST - Findings: Stable tubular devices and basilar atelectasis left greater than right.  Small left effusion is worse.  IMPRESSION: Small left effusion.    Labs  Lab 08/27/11 0425 08/25/11 0455 08/24/11 0625  NA 137 136 136  K 3.6 3.8 3.5  CL 99 98 101  CO2 29 30 29   BUN 17 12 12   CREATININE 0.69 0.60 0.50  GLUCOSE 126* 93 133*    Lab 08/27/11 0425 08/25/11 0455 08/24/11 0625  HGB 9.7* 9.3* 8.8*  HCT 32.0* 30.6* 28.6*  WBC 4.8 6.9 7.4  PLT 473* 554* 466*     Assessment/plan:  Acute Respiratory failure with ALI and ARDS. S/P c/w aspiration  event (resolved). 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 Extubated 12/6, reintubated within an hour, again. She is s/p bedside Trach 12/7 (ENT); suspect large component of reintubation event due to UA obstruction/stridor. Trach placement 12/7, tolerated ATC after all day 12/8 then TC 24 hours 12/9. On cuffless trach since 12/12. Moved to floor on  12/13 Plan: -Continue bronchodilators -try to keep negative balance -spoke with Dr Jenne Pane [facility]) via phone on 12/17, these were the intra-op findings: "Upon direct laryngoscopy, the posterior larynx was markedly edematous in the arytenoid region and the vocal folds were markedly inflamed and friable on both sides with edema. Deep to the vocal folds, the tracheostomy  tube was easily seen with no upper tracheal narrowing. The flexible laryngoscope was passed then through the tracheostomy tube and there was no narrowing in the distal trachea or primary bronchi and the tube was  backed out somewhat and still no narrowing seen".  -given these results his recommendation are for Ms Iams to keep her Janina Mayo in during her Rehab stay. He would like to see Ms Barraclough in his office after discharge. He will evaluate for safe timing of decannulation in the out patient setting.   Aspiration pneumonia (resolved) -Initial sputum grew out Klebsiella oxitoca, (intermediate sensitive to Unasyn). Antibiotics were changed, with eventual clinical improvement.  See above for antibiotic course.  -no off all antibiotics and clinically improved.   AMS/encephalopathy (resolved) Plan: - DecreasedSeroquel 100mg  bid  12/9, would work on decreasing this slowly.  - d/c'd clonidine 12/9 - home cymbalta 12/10  Hypernatremia (resolved).  Lab 08/27/11 0425 08/25/11 0455 08/24/11 0625  NA 137 136 136  Plan: -allow po intake to regulate.   Hypokalemia (resolved)  Lab 08/27/11 0425 08/25/11 0455 08/24/11 0625  K 3.6 3.8 3.5  plan: -check PRN  Anemia: no evidence of bleeding  Lab 08/27/11 0425 08/25/11 0455 08/24/11 0625  HGB 9.7* 9.3* 8.8*  Plan: -transfuse for hemoglobin less than 7 -Continue proton pump inhibitor  Elbow surgery  - Dr Melvyn Novas following - PT OT in in-pt rehab setting  Malnutrition -encourage po intake.   Dispo:  -  In-pt rehab on 12/19     LOS: 23 days     08/30/2011, 3:50 PM  STAFF NOTE: I, Dr Lavinia Sharps have personally reviewed patient's available data, including medical history, events of note, physical examination and test results as part of my evaluation. I have discussed with resident/NP and other care providers such as pharmacist, RN   In addition,  I personally evaluated patient and elicited key findings of patient has agreed to go to inpatient rehab at [facility] for 2 weeks. Insurance has approved her. She will need to keep trach in till Dr Jenne Pane sees her in office in due course.   Rest per NP resident whose note is outlined above and that I agree with

## 2011-08-30 NOTE — Progress Notes (Signed)
We can take patient with trach to inpatient acute rehabilitation. Pager [phone]

## 2011-08-30 NOTE — Progress Notes (Signed)
Occupational Therapy Treatment Patient Details Name: [name] MRN: [id number] DOB: 05-04-1956 Today's Date: 08/30/2011 Time in: 10:05 am Time out: 10:52 am 3SC  OT Assessment/Plan OT Assessment/Plan Comments on Treatment Session: Pt with much improved activity tolerance. Able to sit EOB for approximately 15 minutes to wash upper body. She stood with min assist to attempt washing periareas but became dizzy and had to sit down. Pt motivated to work toward increased independence with ADL.  Note OT order for elbow splint. Also see Rehab/CIR coordinator requesting a current OT note. Will address splinting needs in pm if splint needed today.  OT Plan: Discharge plan needs to be updated OT Frequency: Min 2X/week Follow Up Recommendations: Inpatient Rehab;Other (comment) (note plan to try to get patient to CIR) Equipment Recommended: Defer to next venue OT Goals ADL Goals ADL Goal: Upper Body Bathing - Progress: Progressing toward goals ADL Goal: Lower Body Bathing - Progress: Progressing toward goals ADL Goal: Upper Body Dressing - Progress: Progressing toward goals ADL Goal: Lower Body Dressing - Progress: Progressing toward goals ADL Goal: Additional Goal #1 - Progress: Met  OT Treatment Precautions/Restrictions  Precautions Precautions: Fall Required Braces or Orthoses: Yes Other Brace/Splint: has bilateral ASO's for OOB activity Restrictions Weight Bearing Restrictions: Yes LUE Weight Bearing: Non weight bearing Other Position/Activity Restrictions: No weight through left elbow   ADL ADL Upper Body Bathing: Performed;Chest;Right arm;Left arm;Abdomen;Moderate assistance Upper Body Bathing Details (indicate cue type and reason): Needed assist to lift breasts so she can wash under breasts using right UE. Pt did much better with sitting balance and tolerance at EOB. Where Assessed - Upper Body Bathing: Unsupported;Sitting, bed Lower Body Bathing: Performed;Moderate  assistance Lower Body Bathing Details (indicate cue type and reason): attempted to stand to wash periareas to facilitate standing tolerance/balance but pt became warm and dizzy. Sat back down and performed rest of LB bathing supine. Pt min assist for sit to stand. Pt reports discomfort in bottom crease area. Note redness. Notified nursing who applied barrier cream. Where Assessed - Lower Body Bathing: Rolling right and/or left;Sit to stand from bed Upper Body Dressing: Simulated;Moderate assistance Upper Body Dressing Details (indicate cue type and reason): simulated with gown Where Assessed - Upper Body Dressing: Supine, head of bed flat Lower Body Dressing: Performed;Maximal assistance Lower Body Dressing Details (indicate cue type and reason): assisted pt to remove ASOs as pt complaining they are making her feet sweat. Note several small red areas on front of left lower leg. Notified nursing. Left ASOs off to remove pressure to these areas. total assist with ASOs to doff as they are very snuggly tied. Worked on donning slippers EOB to stand. After pt had to return supine from feeling warm and dizzy, worked on  donning pj bottoms from bed level and rolling to pull up.  Where Assessed - Lower Body Dressing: Sitting, bed;Unsupported;Rolling right and/or left ADL Comments: Pt able to answer a simple question through vocal response within 3 seconds related to her ADL Mobility  Transfers Sit to Stand: 4: Min assist;With upper extremity assist;From elevated surface Sit to Stand Details (indicate cue type and reason): cues for forward weight shift with elbows on b/s table   performed 10 reps Stand to Sit: 4: Min assist;To bed Stand to Sit Details: uncontrolled descent despite cues  Exercises    End of Session OT - End of Session Activity Tolerance: Patient limited by fatigue;Other (comment) (Pt fatigued after bath/dress but motivated and did get dizzy) Patient left: in bed;with call  bell in  reach General Behavior During Session: [facility] for tasks performed Cognition: [facility] for tasks performed  Lennox Laity  [phone] 08/30/2011, 11:36 AM

## 2011-08-31 ENCOUNTER — Inpatient Hospital Stay (HOSPITAL_COMMUNITY)
Admission: RE | Admit: 2011-08-31 | Discharge: 2011-09-08 | DRG: 945 | Disposition: A | Discharge status: Home Health | Payer: No Typology Code available for payment source | Source: Ambulatory Visit | Attending: Physical Medicine & Rehabilitation | Admitting: Physical Medicine & Rehabilitation

## 2011-08-31 DIAGNOSIS — Z930 Tracheostomy status: Secondary | ICD-10-CM

## 2011-08-31 DIAGNOSIS — S8290XD Unspecified fracture of unspecified lower leg, subsequent encounter for closed fracture with routine healing: Secondary | ICD-10-CM

## 2011-08-31 DIAGNOSIS — G4700 Insomnia, unspecified: Secondary | ICD-10-CM | POA: Diagnosis present

## 2011-08-31 DIAGNOSIS — R5381 Other malaise: Secondary | ICD-10-CM

## 2011-08-31 DIAGNOSIS — I10 Essential (primary) hypertension: Secondary | ICD-10-CM | POA: Diagnosis present

## 2011-08-31 DIAGNOSIS — G9340 Encephalopathy, unspecified: Secondary | ICD-10-CM

## 2011-08-31 DIAGNOSIS — F341 Dysthymic disorder: Secondary | ICD-10-CM | POA: Diagnosis present

## 2011-08-31 DIAGNOSIS — J9600 Acute respiratory failure, unspecified whether with hypoxia or hypercapnia: Secondary | ICD-10-CM

## 2011-08-31 DIAGNOSIS — Z5189 Encounter for other specified aftercare: Secondary | ICD-10-CM

## 2011-08-31 DIAGNOSIS — S8263XA Displaced fracture of lateral malleolus of unspecified fibula, initial encounter for closed fracture: Secondary | ICD-10-CM

## 2011-08-31 DIAGNOSIS — S52123A Displaced fracture of head of unspecified radius, initial encounter for closed fracture: Secondary | ICD-10-CM

## 2011-08-31 DIAGNOSIS — S42309D Unspecified fracture of shaft of humerus, unspecified arm, subsequent encounter for fracture with routine healing: Secondary | ICD-10-CM

## 2011-08-31 DIAGNOSIS — K219 Gastro-esophageal reflux disease without esophagitis: Secondary | ICD-10-CM | POA: Diagnosis present

## 2011-08-31 MED ORDER — BIOTENE DRY MOUTH MT LIQD
15.0000 mL | Freq: Four times a day (QID) | OROMUCOSAL | Status: DC
  Administered 2011-08-31 – 2011-09-07 (×14): 15 mL via OROMUCOSAL

## 2011-08-31 MED ORDER — SENNA 8.6 MG PO TABS
1.0000 | ORAL_TABLET | Freq: Two times a day (BID) | ORAL | Status: DC
  Administered 2011-08-31 – 2011-09-08 (×16): 8.6 mg via ORAL
  Filled 2011-08-31 (×24): qty 1

## 2011-08-31 MED ORDER — POTASSIUM CHLORIDE CRYS ER 20 MEQ PO TBCR
40.0000 meq | EXTENDED_RELEASE_TABLET | Freq: Every day | ORAL | Status: DC
  Administered 2011-09-01 – 2011-09-08 (×8): 40 meq via ORAL
  Filled 2011-08-31 (×9): qty 2

## 2011-08-31 MED ORDER — FUROSEMIDE 40 MG PO TABS
40.0000 mg | ORAL_TABLET | Freq: Every day | ORAL | Status: DC
  Administered 2011-09-01 – 2011-09-08 (×8): 40 mg via ORAL
  Filled 2011-08-31 (×9): qty 1

## 2011-08-31 MED ORDER — ALBUTEROL SULFATE (5 MG/ML) 0.5% IN NEBU: 2.5000 mg | INHALATION_SOLUTION | RESPIRATORY_TRACT | Status: DC | PRN

## 2011-08-31 MED ORDER — PROMETHAZINE HCL 25 MG/ML IJ SOLN: 12.5000 mg | Freq: Four times a day (QID) | INTRAMUSCULAR | Status: DC | PRN

## 2011-08-31 MED ORDER — POLYETHYLENE GLYCOL 3350 17 G PO PACK
17.0000 g | PACK | Freq: Every day | ORAL | Status: DC | PRN
  Filled 2011-08-31: qty 1

## 2011-08-31 MED ORDER — DULOXETINE HCL 30 MG PO CPEP
30.0000 mg | ORAL_CAPSULE | Freq: Every day | ORAL | Status: DC
  Administered 2011-09-01 – 2011-09-08 (×8): 30 mg via ORAL
  Filled 2011-08-31 (×9): qty 1

## 2011-08-31 MED ORDER — PROMETHAZINE HCL 12.5 MG RE SUPP: 12.5000 mg | Freq: Four times a day (QID) | RECTAL | Status: DC | PRN

## 2011-08-31 MED ORDER — QUETIAPINE FUMARATE 100 MG PO TABS
100.0000 mg | ORAL_TABLET | Freq: Two times a day (BID) | ORAL | Status: DC
  Administered 2011-08-31 – 2011-09-08 (×16): 100 mg via ORAL
  Filled 2011-08-31 (×19): qty 1

## 2011-08-31 MED ORDER — SORBITOL 70 % SOLN: 30.0000 mL | Freq: Every day | Status: DC | PRN

## 2011-08-31 MED ORDER — PANTOPRAZOLE SODIUM 40 MG PO TBEC
40.0000 mg | DELAYED_RELEASE_TABLET | Freq: Every day | ORAL | Status: DC
  Administered 2011-08-31 – 2011-09-03 (×4): 40 mg via ORAL
  Filled 2011-08-31 (×5): qty 1

## 2011-08-31 MED ORDER — ACETAMINOPHEN 325 MG PO TABS
325.0000 mg | ORAL_TABLET | ORAL | Status: DC | PRN
  Administered 2011-09-01 – 2011-09-05 (×9): 650 mg via ORAL
  Administered 2011-09-05: 325 mg via ORAL
  Administered 2011-09-05 – 2011-09-08 (×8): 650 mg via ORAL
  Filled 2011-08-31 (×18): qty 2

## 2011-08-31 MED ORDER — BACITRACIN ZINC 500 UNIT/GM EX OINT
TOPICAL_OINTMENT | Freq: Two times a day (BID) | CUTANEOUS | Status: DC
  Administered 2011-08-31 – 2011-09-08 (×16): via TOPICAL
  Filled 2011-08-31: qty 15

## 2011-08-31 MED ORDER — TRAZODONE HCL 50 MG PO TABS
25.0000 mg | ORAL_TABLET | Freq: Every day | ORAL | Status: DC
  Administered 2011-08-31 – 2011-09-03 (×4): 25 mg via ORAL
  Filled 2011-08-31 (×4): qty 1

## 2011-08-31 MED ORDER — PROMETHAZINE HCL 12.5 MG PO TABS: 12.5000 mg | ORAL_TABLET | Freq: Four times a day (QID) | ORAL | Status: DC | PRN

## 2011-08-31 NOTE — Progress Notes (Signed)
CSW met with Pt to reassess needs prior to transfer to CIR today. Pt states she was able to get her insurance forms from her daughter to fill out. She states she will need assistance with faxing them to her insurance once they are completed. She has been very concerned about her insurance as it is open enrollment time and she realizes the importance of continuing her coverage. Please have CSW at [facility] check on this concern. CSW educated Pt that there were also CSWs there to assist Pt. Per Pt, she has concerns about Christmas and being away from her 9yo son. She appears to have come to accept her need to get stronger so she can go home. This CSW provided support and signing off.  [name] 08/31/2011 9:55 AM [phone]

## 2011-08-31 NOTE — Plan of Care (Signed)
Overall Plan of Care [facility]) Patient Details Name: [name] MRN: [id number] DOB: 02/10/1956  Diagnosis:  Deconditioning after acute respiratory failure and trach.  Left radius and fibular fx  Primary Diagnosis:    ARF (acute respiratory failure) Co-morbidities: anemia, pain  Functional Problem List  Patient demonstrates impairments in the following areas: Balance, Endurance, Motor, Pain, Safety and Skin Integrity cognition  Basic ADL's: grooming, bathing, dressing and toileting Advanced ADL's: simple meal preparation  Transfers:  bed mobility, bed to chair, toilet, tub/shower, car and furniture Locomotion:  ambulation, wheelchair mobility and stairs  Additional Impairments:  Swallowing and Communication  expression  Anticipated Outcomes Item Anticipated Outcome  Eating/Swallowing  Mod I  Basic self-care    Tolieting    Bowel/Bladder  Pt will remain continent of bowel and bladder  Transfers  Mod I  Locomotion  Mod I  Communication  Mod I  Cognition  Supervision   Pain  Pain Goal will be less than 3  Safety/Judgment  Supervision   Other     Therapy Plan:        Team Interventions: Item RN PT OT SLP SW TR Other  Self Care/Advanced ADL Retraining    x      Neuromuscular Re-Education   x      Therapeutic Activities  x x x     UE/LE Strength Training/ROM  x x      UE/LE Coordination Activities  x x      Visual/Perceptual Remediation/Compensation         DME/Adaptive Equipment Instruction  x x      Therapeutic Exercise  x x      Balance/Vestibular Training  x x      Patient/Family Education  x x x     Cognitive Remediation/Compensation   x x     Functional Mobility Training  x       Ambulation/Gait Training  x       Museum/gallery curator  x       Wheelchair Propulsion/Positioning  x       Functional Tourist information centre manager Reintegration   x      Dysphagia/Aspiration Printmaker    x     Speech/Language Facilitation    x       Bladder Management         Bowel Management         Disease Management/Prevention X        Pain Management X x       Medication Management X        Skin Care/Wound Management         Splinting/Orthotics         Discharge Planning     x    Psychosocial Support     x                       Team Discharge Planning: Destination:  Home Projected Follow-up:  PT, OT and Outpatient Projected Equipment Needs:  Tub Bench and Wheelchair Patient/family involved in discharge planning:  Yes  MD ELOS: 10-14 days Medical Rehab Prognosis:  Excellent Assessment: patient is admitted for cir therapies.  Patient will receive therapies at least 3 hours a day at least 5 days a week.  The focus will be on communication and trach mgt, safety, range of motion particularly at the elbow, gait, adl's, upper and lower ext strength with goals modified independent with  mobility and self-care to supervision with cognition.

## 2011-08-31 NOTE — Progress Notes (Deleted)
RT attempted to change trach at this time, and couldn't get the new trach back in. It appeared to be in, but there was no color change on ETCO2. I called supervisor and he confirmed it was not in. MD aware, and he said to leave the trach out. Cleaned stoma and covered with gauze. RT will continue to monitor.   

## 2011-08-31 NOTE — Progress Notes (Signed)
Pt is alert and oriented transferred via care link from [name] to room 4030.  Pt oriented to unit, safety plan discussed along with rehab specific routine. Safety video watched.  Call bell in reach,  Will continue to monitor [name]

## 2011-08-31 NOTE — Progress Notes (Signed)
Insurance has approved and bed is available for inpatient acute rehabilitation today. We plan admit. Pager [phone].

## 2011-08-31 NOTE — H&P (Signed)
Physical Medicine and Rehabilitation Admission H&P   [name] is an 55 y.o. female.    Chief Complaint   Patient presents with   .  Drug Overdose    : HPI: 55 year old right-handed female recent left elbow surgery with open reduction internal fixation of displaced radial head fracture per Dr. Melvyn Novas on November 24 as well as left distal fibula fracture. She was discharged home admitted on November 25 with altered mental status with persistent hypoxia felt to be related to possible narcotics and sleeping pill. Urine drug screen positive for opiates and benzodiazepines. Patient with progressive respiratory distress and received followed by critical care medicine for suggested aspiration event. She would ultimately receive tracheostomy and changed to #6 cuffless on December 12. Overall mental status has improved but still altered suggesting encephalopathy. No cranial CT scan was noted. Working with Passy-Muir valve per speech therapy. She remains nonweightbearing to left upper extremity after recent elbow surgery. Advised long all arm removable splint and may do active range of motion of elbow wrist and forearm with no passive mobility and no weightbearing on left elbow. She may wear the splint out of bed for comfort does not have to wear it continually. Upper and lower extremity venous Dopplers were negative for deep vein thrombosis. She is weightbearing as tolerated to the left lower extremity and also received ASO on left ankle and right ankle per her request. Tracheostomy tube remains in place with speech therapy followup and using PMV. Recent direct laryngoscopy per Dr. Jenne Pane of [facility] posterior larynx  was markedly edematous and the vocal folds were markedly inflamed and no upper tracheal narrowing noted .It was advised to keep her tracheostomy tube in place during her rehabilitation stay and followup with ENT outpatient to evaluate safe timing for decannulation in the outpatient  setting. She is currently moderate assist for supine to sit and transfers via lift equipment. Ambulation  of 5 feet and moderate assistance using hand-held assist. Inpatient rehabilitation services was consulted for questionable inpatient rehabilitation.     Review of Systems   Constitutional: Positive for malaise/fatigue.   Eyes: Negative for double vision.   Respiratory: Negative for cough and shortness of breath.   Cardiovascular: Negative for chest pain.   Gastrointestinal: Positive for constipation. Negative for nausea.   Genitourinary: Negative for dysuria.   Musculoskeletal: Positive for joint pain.   Skin: Negative.   Neurological: Negative for dizziness and headaches.   Psychiatric/Behavioral: Negative for depression. The patient has insomnia.   Anxiety     Past Medical History   Diagnosis  Date   .  Ovarian cyst     .  Pneumonia         hx x4   .  Cancer         precancerous cervix   .  GERD (gastroesophageal reflux disease)     .  Headache     .  Hiatal hernia     .  PONV (postoperative nausea and vomiting)  08/06/11   .  Difficult intubation     .  Aspiration pneumonia     .  Respiratory failure      Past Surgical History   Procedure  Date   .  Cesarean section  87   .  Knee surgery  14       rt arthroscopy   .  Ovarian cyst removal  80   .  Abdominal hysterectomy  88   .  Orif elbow fracture  08/06/2011       Procedure: OPEN REDUCTION INTERNAL FIXATION (ORIF) ELBOW/OLECRANON FRACTURE;  Surgeon: Sharma Covert;  Location: [facility] OR;  Service: Orthopedics;  Laterality: Left;   .  Direct laryngoscopy  08/19/2011       Procedure: DIRECT LARYNGOSCOPY;  Surgeon: Antony Contras;  Location: [facility];  Service: ENT;  Laterality: N/A;   .  Tracheostomy tube placement  08/19/2011       Procedure: TRACHEOSTOMY;  Surgeon: Antony Contras;  Location: [facility];  Service: ENT;  Laterality: N/A;  tracheoscopy    History reviewed. No pertinent family history. Social History: reports  that she has never smoked. She does not have any smokeless tobacco history on file. She reports that she drinks alcohol. She reports that she does not use illicit drugs. Allergies:  Allergies   Allergen  Reactions   .  Meperidine Hcl  Itching       REACTION: hives, itich    Medications Prior to Admission   Medication  Dose  Route  Frequency  Provider  Last Rate  Last Dose   .  0.9 %  sodium chloride infusion     Intravenous  Continuous  Domenick Roma  100 mL/hr at 08/08/11 2055      .  0.9 %  sodium chloride infusion     Intravenous  Continuous  Rakesh V. Vassie Loll, MD  20 mL/hr at 08/29/11 1845      .  acetaminophen (TYLENOL) 325 MG suppository              975 mg at 08/07/11 2324   .  acetaminophen (TYLENOL) tablet 650 mg   650 mg  Oral  Q4H PRN  Kalman Shan, MD     650 mg at 08/28/11 2003   .  albuterol (PROVENTIL) (5 MG/ML) 0.5% nebulizer solution 2.5 mg   2.5 mg  Nebulization  Q4H PRN  Billy Fischer, MD     2.5 mg at 08/18/11 0759   .  albuterol (PROVENTIL) (5 MG/ML) 0.5% nebulizer solution 5 mg   5 mg  Nebulization  Once  Lyanne Co, MD     5 mg at 08/07/11 2110   .  alteplase (CATHFLO ACTIVASE) injection 2 mg   2 mg  Intracatheter  Once  Leslye Peer, MD     2 mg at 08/26/11 1500   .  antiseptic oral rinse (BIOTENE) solution 15 mL   15 mL  Mouth Rinse  QID  Sandrea Hughs, MD     15 mL at 08/31/11 0400   .  aspirin chewable tablet 324 mg   324 mg  Oral  NOW  Domenick Roma           Or   .  aspirin suppository 300 mg   300 mg  Rectal  NOW  Domenick Roma     300 mg at 08/08/11 0351   .  bacitracin ointment     Topical  BID  Antony Contras         .  ceFAZolin (ANCEF) 2-3 GM-% IVPB SOLR                  .  ceFAZolin (ANCEF) IVPB 1 g/50 mL premix   1 g  Intravenous  Q6H  Fred W Ortmann     1 g at 08/07/11 0342   .  ceFAZolin (ANCEF) IVPB 1 g/50 mL premix       PRN  Richard  Adami, CRNA     1,000 mg at 08/07/11 0341   .  clindamycin (CLEOCIN) IVPB 900 mg   900 mg  Intravenous  Once   Lyanne Co, MD     900 mg at 08/07/11 2336   .  DULoxetine (CYMBALTA) DR capsule 30 mg   30 mg  Oral  Daily  Leslye Peer, MD     30 mg at 08/30/11 1000   .  etomidate (AMIDATE) 2 MG/ML injection                  .  etomidate (AMIDATE) 2 MG/ML injection              10 mg at 08/18/11 0915   .  fentaNYL (SUBLIMAZE) injection 100 mcg   100 mcg  Intravenous  Once  Rakesh V. Vassie Loll, MD         .  fentaNYL [facility]) injection       PRN  Gwenyth Allegra, CRNA     100 mcg at 08/06/11 0831   .  furosemide (LASIX) injection 40 mg   40 mg  Intravenous  Once  Sandrea Hughs, MD     40 mg at 08/12/11 1313   .  furosemide (LASIX) tablet 40 mg   40 mg  Oral  Daily  Vilinda Blanks Minor, NP     40 mg at 08/30/11 1000   .  glycopyrrolate (ROBINUL) injection       PRN  Deatra Robinson Mahony     0.6 mg at 08/06/11 1027   .  haloperidol lactate (HALDOL) 5 MG/ML injection                  .  influenza  inactive virus vaccine (FLUZONE/FLUARIX) injection 0.5 mL   0.5 mL  Intramuscular  Tomorrow-1000  Leslye Peer, MD     0.5 mL at 08/09/11 0911   .  iohexol (OMNIPAQUE) 300 MG/ML injection 100 mL   100 mL  Intravenous  Once PRN  Medication Radiologist     100 mL at 08/08/11 0137   .  iohexol (OMNIPAQUE) 300 MG/ML injection 100 mL   100 mL  Intravenous  Once PRN  Medication Radiologist     100 mL at 08/18/11 1215   .  ipratropium (ATROVENT) nebulizer solution 0.5 mg   0.5 mg  Nebulization  Once  Lyanne Co, MD     0.5 mg at 08/07/11 2110   .  lactated ringers infusion       Continuous PRN  Gwenyth Allegra, CRNA         .  lidocaine (cardiac) 100 mg/44ml (XYLOCAINE) 20 MG/ML injection 2%                  .  lidocaine (cardiac) 100 mg/89ml (XYLOCAINE) 20 MG/ML injection 2%                  .  midazolam (VERSED) 5 MG/5ML injection       PRN  Gwenyth Allegra, CRNA     2 mg at 08/06/11 0815   .  naloxone [facility]) 1 MG/ML injection                  .  naloxone (NARCAN) 1 MG/ML injection                  .  neostigmine  (PROSTIGMINE) injection     Intravenous  PRN  Lurena Joiner  Dobson Mahony     3 mg at 08/06/11 1027   .  ondansetron (ZOFRAN) 4 MG/2ML injection                  .  ondansetron (ZOFRAN) injection       PRN  Deatra Robinson Mahony     4 mg at 08/06/11 1022   .  pantoprazole (PROTONIX) EC tablet 40 mg   40 mg  Oral  Q1200  Max Fickle, MD     40 mg at 08/30/11 1138   .  phenylephrine (NEO-SYNEPHRINE) injection       PRN  Deatra Robinson Mahony     80 mcg at 08/06/11 1610   .  potassium chloride 10 mEq in 50 mL *CENTRAL LINE* IVPB   10 mEq  Intravenous  Q1 Hr x 4  Anders Simmonds, NP     10 mEq at 08/10/11 1748   .  potassium chloride 10 mEq in 50 mL *CENTRAL LINE* IVPB   10 mEq  Intravenous  Q1 Hr x 4  Elizabeth Deterding     10 mEq at 08/11/11 0907   .  potassium chloride 20 MEQ/15ML (10%) liquid 40 mEq   40 mEq  Per Tube  Once  Billy Fischer, MD     40 mEq at 08/13/11 0704   .  potassium chloride 20 MEQ/15ML (10%) liquid 40 mEq   40 mEq  Per Tube  Once  Mcarthur Rossetti. Feinstein     40 mEq at 08/13/11 1017   .  potassium chloride 20 MEQ/15ML (10%) liquid 40 mEq   40 mEq  Per Tube  Once  Mcarthur Rossetti. Feinstein     40 mEq at 08/14/11 1314   .  potassium chloride 20 MEQ/15ML (10%) liquid 40 mEq   40 mEq  Per Tube  Once  Vilinda Blanks Minor, NP     40 mEq at 08/15/11 1029   .  potassium chloride 20 MEQ/15ML (10%) liquid 40 mEq   40 mEq  Per Tube  Once  Elizabeth Deterding     40 mEq at 08/16/11 0540   .  potassium chloride 20 MEQ/15ML (10%) liquid 40 mEq   40 mEq  Oral  Once  Elizabeth Deterding     40 mEq at 08/17/11 0757   .  potassium chloride 20 MEQ/15ML (10%) liquid 40 mEq   40 mEq  Oral  Once  Leslye Peer, MD     40 mEq at 08/21/11 1243   .  potassium chloride 20 MEQ/15ML (10%) liquid 40 mEq   40 mEq  Oral  Daily  Berkley Harvey, PHARMD     40 mEq at 08/28/11 1026   .  potassium chloride 20 MEQ/15ML (10%) liquid 80 mEq   80 mEq  Per Tube  Once  Elizabeth Deterding     80 mEq at 08/19/11 0629   .  potassium  chloride 20 MEQ/15ML (10%) liquid 80 mEq   80 mEq  Per Tube  Once  Elizabeth Deterding     80 mEq at 08/20/11 0708   .  potassium chloride 20 MEQ/15ML (10%) liquid 80 mEq   80 mEq  Per Tube  Once  Elizabeth Deterding     80 mEq at 08/21/11 0736   .  potassium chloride SA (K-DUR,KLOR-CON) CR tablet 20 mEq   20 mEq  Oral  Once  Vilinda Blanks Minor, NP     20 mEq at 08/24/11 0927   .  promethazine (PHENERGAN) injection 12.5 mg   12.5 mg  Intravenous  Q4H PRN  Vilinda Blanks Minor, NP         .  propofol (DIPRIVAN) 10 MG/ML infusion                  .  QUEtiapine (SEROQUEL) tablet 100 mg   100 mg  Oral  BID  Leslye Peer, MD     100 mg at 08/30/11 2219   .  Racepinephrine HCl 2.25 % nebulizer solution                  .  rocuronium (ZEMURON) 50 MG/5ML injection                  .  rocuronium (ZEMURON) 50 MG/5ML injection                  .  rocuronium (ZEMURON) injection       PRN  Gwenyth Allegra, CRNA     50 mg at 08/06/11 5784   .  sodium chloride 0.9 % injection 10 mL   10 mL  Intracatheter  Q12H  [name] Grout, MD     10 mL at 08/25/11 1002   .  sodium chloride 0.9 % injection 10 mL   10 mL  Intracatheter  PRN  [name] Grout, MD     10 mL at 08/29/11 1955   .  succinylcholine (ANECTINE) 20 MG/ML injection                  .  succinylcholine (ANECTINE) 20 MG/ML injection              50 mg at 08/18/11 0910   .  DISCONTD: 0.9 %  sodium chloride infusion   250 mL  Intravenous  PRN  Domenick Roma  20 mL/hr at 08/11/11 1100  250 mL at 08/11/11 1100   .  DISCONTD: acetylcysteine (MUCOMYST) 10 % nebulizer solution 2 mL   2 mL  Nebulization  Q6H  Kalman Shan, MD         .  DISCONTD: acetylcysteine (MUCOMYST) 20 % nebulizer solution 1 mL   1 mL  Nebulization  Q6H  Lorenza Evangelist, PHARMD     1 mL at 08/18/11 0758   .  DISCONTD: albuterol (PROVENTIL) (5 MG/ML) 0.5% nebulizer solution 5 mg   5 mg  Nebulization  Q4H PRN  Domenick Roma         .  DISCONTD: Ampicillin-Sulbactam (UNASYN) 3 g in sodium chloride  0.9 % 100 mL IVPB   3 g  Intravenous  Q6H  Domenick Roma     3 g at 08/10/11 0811   .  DISCONTD: ceFAZolin (ANCEF) IVPB 2 g/50 mL premix   2 g  Intravenous  60 min Pre-Op  Sharma Covert         .  DISCONTD: cefTRIAXone (ROCEPHIN) 1 g in dextrose 5 % 50 mL IVPB   1 g  Intravenous  Q24H  Vilinda Blanks Minor, NP     1 g at 08/17/11 1307   .  DISCONTD: chlorhexidine (HIBICLENS) 4 % liquid 4 application   60 mL  Topical  Once  Fred W Ortmann         .  DISCONTD: chlorhexidine (PERIDEX) 0.12 % solution 15 mL   15 mL  Mouth/Throat  BID  Leslye Peer, MD     15 mL at 08/18/11 2007   .  DISCONTD: chlorhexidine (PERIDEX) 0.12 % solution 15 mL   15 mL  Mouth Rinse  BID  Sandrea Hughs, MD     15 mL at 08/30/11 1000   .  DISCONTD: clonazePAM (KLONOPIN) 0.1 mg/mL oral suspension 0.5 mg   0.5 mg  Oral  BID  Anders Simmonds, NP         .  DISCONTD: clonazePAM (KLONOPIN) tablet 0.5 mg   0.5 mg  Oral  BID  Reece Packer, PHARMD     0.5 mg at 08/15/11 0925   .  DISCONTD: clonazePAM (KLONOPIN) tablet 1 mg   1 mg  Oral  BID  Vilinda Blanks Minor, NP     1 mg at 08/17/11 1034   .  DISCONTD: cloNIDine (CATAPRES) tablet 0.1 mg   0.1 mg  Oral  TID  Sandrea Hughs, MD     0.1 mg at 08/13/11 1017   .  DISCONTD: cloNIDine (CATAPRES) tablet 0.1 mg   0.1 mg  Oral  BID  Rakesh V. Vassie Loll, MD     0.1 mg at 08/21/11 1004   .  DISCONTD: dexmedetomidine (PRECEDEX) 200 mcg in sodium chloride 0.9 % 50 mL infusion   0.4-1.2 mcg/kg/hr  Intravenous  Continuous  Anders Simmonds, NP         .  DISCONTD: dextrose 5 % and 0.45 % NaCl with KCl 20 mEq/L infusion     Intravenous  Continuous  Thomasene Ripple Ortmann  75 mL/hr at 08/07/11 0342      .  DISCONTD: dextrose 5 % solution     Intravenous  Continuous  Mcarthur Rossetti. Feinstein  20 mL/hr at 08/20/11 1332  20 mL at 08/20/11 1332   .  DISCONTD: dextrose 5 %-0.45 % sodium chloride infusion     Intravenous  Continuous  Anders Simmonds, NP         .  DISCONTD: diphenhydrAMINE (BENADRYL) 12.5 MG/5ML elixir 12.5-25 mg    12.5-25 mg  Oral  Q4H PRN  Sharma Covert         .  DISCONTD: droperidol (INAPSINE) injection 0.625 mg   0.625 mg  Intravenous  PRN  [name], MD         .  DISCONTD: DULoxetine (CYMBALTA) DR capsule 30 mg   30 mg  Oral  BID  Fred W Ortmann     30 mg at 08/06/11 1346   .  DISCONTD: DULoxetine (CYMBALTA) DR capsule 30 mg   30 mg  Oral  Daily  Domenick Roma     30 mg at 08/12/11 1033   .  DISCONTD: enoxaparin (LOVENOX) injection 40 mg   40 mg  Subcutaneous  Q24H  Domenick Roma     40 mg at 08/12/11 1047   .  DISCONTD: feeding supplement (JEVITY 1.2) liquid 1,000 mL   1,000 mL  Oral  Continuous  Canary Brim, NP         .  DISCONTD: feeding supplement (JEVITY 1.2) liquid 1,000 mL   1,000 mL  Per Tube  Continuous  Hoyt Koch, RD  70 mL/hr at 08/24/11 0541  1,000 mL at 08/24/11 0541   .  DISCONTD: feeding supplement (OXEPA) liquid 1,000 mL   1,000 mL  Per Tube  Continuous  Hoyt Koch, RD  25 mL/hr at 08/11/11 2211  1,000 mL at 08/11/11 2211   .  DISCONTD: feeding supplement (OXEPA) liquid 1,000 mL   1,000 mL  Per Tube  Continuous  Mcarthur Rossetti. Feinstein  20 mL/hr at 08/14/11 1318  1,000 mL at 08/14/11 1318   .  DISCONTD: feeding supplement (OXEPA) liquid 1,000 mL   1,000 mL  Per Tube  Continuous  Elyse Earlie Raveling, RD  20 mL/hr at 08/17/11 0600  1,000 mL at 08/17/11 0600   .  DISCONTD: feeding supplement (OXEPA) liquid 1,000 mL   1,000 mL  Per Tube  Continuous  Lonia Farber, MD  20 mL/hr at 08/20/11 1614  1,000 mL at 08/20/11 1614   .  DISCONTD: feeding supplement (OXEPA) liquid 1,000 mL   1,000 mL  Per Tube  Q24H  Christine Elizabeth Paspek, PHARMD     1,000 mL at 08/21/11 1610   .  DISCONTD: feeding supplement (PRO-STAT SUGAR FREE 64) liquid 30 mL   30 mL  Oral  TID WC  Hoyt Koch, RD     30 mL at 08/14/11 1306   .  DISCONTD: feeding supplement (PRO-STAT SUGAR FREE 64) liquid 30 mL   30 mL  Per Tube  TID WC  Elyse Earlie Raveling, RD      30 mL at 08/22/11 1151   .  DISCONTD: fentaNYL (SUBLIMAZE) 10 mcg/mL in sodium chloride 0.9 % 250 mL infusion   50-400 mcg/hr  Intravenous  Titrated  Domenick Roma  5 mL/hr at 08/10/11 1000  50 mcg/hr at 08/10/11 1000   .  DISCONTD: fentaNYL (SUBLIMAZE) 10 mcg/mL in sodium chloride 0.9 % 250 mL infusion   25-400 mcg/hr  Intravenous  Continuous  Anders Simmonds, NP     25 mcg/hr at 08/17/11 0800   .  DISCONTD: fentaNYL (SUBLIMAZE) bolus via infusion 50-100 mcg   50-100 mcg  Intravenous  Q6H PRN  Domenick Roma         .  DISCONTD: free water 200 mL   200 mL  Per Tube  Q8H  Anders Simmonds, NP     200 mL at 08/12/11 1049   .  DISCONTD: furosemide (LASIX) 8 MG/ML solution 40 mg   40 mg  Oral  BID  Rakesh V. Vassie Loll, MD         .  DISCONTD: furosemide (LASIX) 8 MG/ML solution 40 mg   40 mg  Per Tube  BID  Rakesh V. Vassie Loll, MD     40 mg at 08/22/11 1733   .  DISCONTD: furosemide (LASIX) 8 MG/ML solution 40 mg   40 mg  Per Tube  BID  Christian M Buettner, PHARMD     40 mg at 08/24/11 1610   .  DISCONTD: furosemide (LASIX) injection 40 mg   40 mg  Intravenous  Q12H  Anders Simmonds, NP     40 mg at 08/11/11 1145   .  DISCONTD: furosemide (LASIX) injection 40 mg   40 mg  Intravenous  Q12H  Daniel J. Feinstein     40 mg at 08/14/11 0941   .  DISCONTD: furosemide (LASIX) injection 40 mg   40 mg  Intravenous  TID  Mcarthur Rossetti. Feinstein     40 mg at 08/17/11 1033   .  DISCONTD: furosemide (LASIX) tablet 40 mg   40 mg  Oral  BID  Max Fickle, MD     40 mg at 08/25/11 0815   .  DISCONTD: HYDROcodone-acetaminophen (NORCO) 5-325 MG per tablet 1 tablet   1 tablet  Oral  Q8H PRN  Sharma Covert         .  DISCONTD: HYDROcodone-acetaminophen (  NORCO) 5-325 MG per tablet 1.5-3 tablet   1.5-3 tablet  Oral  Q4H PRN  Severiano Gilbert, PHARMD         .  DISCONTD: HYDROcodone-acetaminophen (NORCO) 7.5-325 MG per tablet 1-2 tablet   1-2 tablet  Oral  Q4H PRN  Sharma Covert         .  DISCONTD: HYDROmorphone (DILAUDID) injection 0.25-0.5  mg   0.25-0.5 mg  Intravenous  Q5 min PRN  [name], MD         .  DISCONTD: HYDROmorphone (DILAUDID) injection 0.5-1 mg   0.5-1 mg  Intravenous  Q2H PRN  Thomasene Ripple Ortmann     1 mg at 08/07/11 0910   .  DISCONTD: ibuprofen (ADVIL,MOTRIN) tablet 800 mg   800 mg  Oral  Q6H PRN  Sharma Covert         .  DISCONTD: imipenem-cilastatin (PRIMAXIN) 500 mg in sodium chloride 0.9 % 100 mL IVPB   500 mg  Intravenous  Q6H  Lorenza Evangelist, PHARMD     500 mg at 08/25/11 1146   .  DISCONTD: lidocaine-EPINEPHrine (XYLOCAINE W/EPI) 1 %-1:100000 (with pres) injection       PRN  Excell Seltzer Bates     3 mL at 08/19/11 1236   .  DISCONTD: meperidine (DEMEROL) injection 6.25-12.5 mg   6.25-12.5 mg  Intravenous  Q5 min PRN  [name], MD         .  DISCONTD: methocarbamol (ROBAXIN) 500 mg in dextrose 5 % 50 mL IVPB   500 mg  Intravenous  Q6H PRN  Sharma Covert         .  DISCONTD: methocarbamol (ROBAXIN) tablet 500 mg   500 mg  Oral  Q6H PRN  Thomasene Ripple Ortmann     500 mg at 08/07/11 1130   .  DISCONTD: metoCLOPramide (REGLAN) injection 5-10 mg   5-10 mg  Intravenous  Q8H PRN  Thomasene Ripple Ortmann     10 mg at 08/06/11 2234   .  DISCONTD: metoCLOPramide (REGLAN) tablet 5-10 mg   5-10 mg  Oral  Q8H PRN  Sharma Covert         .  DISCONTD: midazolam (VERSED) injection 0.5-2 mg   0.5-2 mg  Intravenous  Once PRN  [name], MD         .  DISCONTD: ondansetron [facility]) injection 4 mg   4 mg  Intravenous  Q6H PRN  Sharma Covert         .  DISCONTD: ondansetron (ZOFRAN) tablet 4 mg   4 mg  Oral  Q6H PRN  Thomasene Ripple Ortmann     4 mg at 08/07/11 0123   .  DISCONTD: oxyCODONE (Oxy IR/ROXICODONE) immediate release tablet 5-10 mg   5-10 mg  Oral  Q3H PRN  Thomasene Ripple Ortmann     10 mg at 08/07/11 1130   .  DISCONTD: oxyCODONE-acetaminophen (PERCOCET) 5-325 MG per tablet 1-2 tablet   1-2 tablet  Oral  Q4H PRN  Sharma Covert     2 tablet at 08/07/11 4098   .  DISCONTD: pantoprazole (PROTONIX) EC tablet 40 mg   40 mg  Oral   Q1200  Fred W Ortmann     40 mg at 08/06/11 1346   .  DISCONTD: pantoprazole (PROTONIX) EC tablet 40 mg   40 mg  Oral  Q1200  Domenick Roma         .  DISCONTD: pantoprazole sodium (PROTONIX) 40 mg/20 mL oral suspension 40 mg   40 mg  Per Tube  Q1200  Sandrea Hughs, MD     40 mg at 08/24/11 1316   .  DISCONTD: potassium chloride 20 MEQ/15ML (10%) liquid 40 mEq   40 mEq  Per Tube  Daily  Leslye Peer, MD         .  DISCONTD: potassium chloride SA (K-DUR,KLOR-CON) CR tablet 40 mEq   40 mEq  Oral  Once  [employer]         .  DISCONTD: pregabalin (LYRICA) 200 mg   200 mg  Oral  BID  Fred W Ortmann     200 mg at 08/06/11 1345   .  DISCONTD: propofol (DIPRIVAN) 10 MG/ML infusion       PRN  Gwenyth Allegra, CRNA     130 mg at 08/06/11 0831   .  DISCONTD: propofol (DIPRIVAN) 10 MG/ML infusion   5-50 mcg/kg/min  Intravenous  Titrated  Domenick Roma  2.8 mL/hr at 08/10/11 1000  5 mcg/kg/min at 08/10/11 1000   .  DISCONTD: propofol (DIPRIVAN) 10 MG/ML infusion   5-70 mcg/kg/min  Intravenous  Continuous  Anders Simmonds, NP  5.4 mL/hr at 08/20/11 0830  9 mcg/kg/min at 08/20/11 0830   .  DISCONTD: propofol (DIPRIVAN) 10 MG/ML infusion   5-70 mcg/kg/min  Intravenous  Continuous  Max Fickle, MD     6 mcg/kg/min at 08/20/11 0915   .  DISCONTD: QUEtiapine (SEROQUEL) tablet 100 mg   100 mg  Oral  BID  Mcarthur Rossetti. Feinstein     100 mg at 08/15/11 0925   .  DISCONTD: QUEtiapine (SEROQUEL) tablet 200 mg   200 mg  Oral  BID  Rakesh V. Vassie Loll, MD     200 mg at 08/16/11 1029   .  DISCONTD: QUEtiapine (SEROQUEL) tablet 200 mg   200 mg  Oral  TID  Rakesh V. Vassie Loll, MD     200 mg at 08/17/11 1034   .  DISCONTD: QUEtiapine (SEROQUEL) tablet 200 mg   200 mg  Oral  BID  Rakesh V. Vassie Loll, MD     200 mg at 08/21/11 1006   .  DISCONTD: QUEtiapine (SEROQUEL) tablet 50 mg   50 mg  Oral  BID  Anders Simmonds, NP     50 mg at 08/14/11 0945   .  DISCONTD: senna-docusate (Senokot-S) tablet 1 tablet   1 tablet  Oral  QHS PRN  Sharma Covert          .  DISCONTD: SUMAtriptan (IMITREX) tablet 100 mg   100 mg  Oral  Q2H PRN  Sharma Covert         .  DISCONTD: traZODone (DESYREL) tablet 25 mg   25 mg  Oral  QHS  Sharma Covert         .  DISCONTD: vancomycin (VANCOCIN) IVPB 1000 mg/200 mL premix   1,000 mg  Intravenous  Q8H  Leann Trefz Poindexter, PHARMD     1,000 mg at 08/21/11 1004   .  DISCONTD: vancomycin (VANCOCIN) IVPB 1000 mg/200 mL premix   1,000 mg  Intravenous  Q12H  Reece Packer, PHARMD     1,000 mg at 08/24/11 1610   .  DISCONTD: zolpidem (AMBIEN) tablet 5 mg   5 mg  Oral  QHS PRN  Sharma Covert  Medications Prior to Admission   Medication  Sig  Dispense  Refill   .  docusate sodium (COLACE) 100 MG capsule  Take 1 capsule (100 mg total) by mouth 2 (two) times daily.   30 capsule   0   .  DULoxetine (CYMBALTA) 30 MG capsule  Take 30 mg by mouth daily.          Marland Kitchen  HYDROmorphone (DILAUDID) 2 MG tablet  Take 1 tablet (2 mg total) by mouth every 4 (four) hours as needed for pain (DO NOT TAKE WITH OTHER NARCOTICS TO INCLUDE NORCO).   30 tablet   0   .  methocarbamol (ROBAXIN) 500 MG tablet  Take 1 tablet (500 mg total) by mouth 4 (four) times daily.   30 tablet   0   .  acetaminophen (TYLENOL) 325 MG tablet  Take 2 tablets (650 mg total) by mouth every 4 (four) hours as needed for fever.   30 tablet      .  albuterol (PROVENTIL) (5 MG/ML) 0.5% nebulizer solution  Take 0.5 mLs (2.5 mg total) by nebulization every 4 (four) hours as needed for wheezing or shortness of breath.   20 mL      .  antiseptic oral rinse (BIOTENE) LIQD  15 mLs by Mouth Rinse route QID.         Marland Kitchen  bacitracin ointment  Apply topically 2 (two) times daily.   120 g      .  docusate sodium (COLACE) 100 MG capsule  Take 1 capsule (100 mg total) by mouth 2 (two) times daily.   30 capsule   0   .  HYDROcodone-acetaminophen (NORCO) 5-325 MG per tablet  Take 2 tablets by mouth every 6 (six) hours as needed. For pain.         Marland Kitchen   HYDROcodone-acetaminophen (NORCO) 5-325 MG per tablet  Take 2 tablets by mouth every 6 (six) hours as needed. For pain.   30 tablet      .  pantoprazole (PROTONIX) 40 MG tablet  Take 1 tablet (40 mg total) by mouth daily at 12 noon.         .  QUEtiapine (SEROQUEL) 100 MG tablet  Take 1 tablet (100 mg total) by mouth 2 (two) times daily.         .  sodium chloride 0.9 % infusion  kvo NS         .  sodium chloride 0.9 % injection  10 mLs by Intracatheter route every 12 (twelve) hours.   5 mL         Home: Home Living Lives With:  (boyfriend , Angelene Giovanni of 4 years) Actor Help From:  (Daughter, Annice Pih, and Corporate treasurer) Type of Home: House Home Layout: Two level;1/2 bath on main level;Bed/bath upstairs Alternate Level Stairs-Rails: Can reach both;Left;Right (only half way up 2 rails) Alternate Level Stairs-Number of Steps: flight Home Access:  (two) Entrance Stairs-Rails: None Entrance Stairs-Number of Steps: 2 Bathroom Shower/Tub: Engineer, manufacturing systems: Standard Bathroom Accessibility: Yes How Accessible: Accessible via walker Home Adaptive Equipment: Walker - rolling;Wheelchair - manual Additional Comments: patient seemed to have difficulty recalling any bathroom equipment she may own. She thinks about it for an increased period of time and indicated she is not sure.    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) Comments:  (  Chronic pain issues, r knee injury jan.2012)   Functional Status:   Mobility: Bed Mobility Supine to Sit: 3: Mod assist;HOB flat Supine to Sit Details (indicate cue type and reason): assist for upper body to lift upright Sitting - Scoot to Edge of Bed: 4: Min assist Sitting - Scoot to [place] of Bed Details (indicate cue type and reason): used pad to scoot patient  forward to EOB Transfers Transfers: Yes Sit to Stand: 4: Min assist;With upper extremity assist;From elevated surface Sit to Stand Details (indicate cue type and reason): cues for forward weight shift with elbows on b/s table   performed 10 reps Stand to Sit: 4: Min assist;To bed Stand to Sit Details: uncontrolled descent despite cues   Squat Pivot Transfers: 1: +2 Total assist Squat Pivot Transfer Details (indicate cue type and reason): pt=20-25%, bed<>BSC Transfer via Lift Equipment: Stedy Ambulation/Gait Ambulation/Gait: Yes Ambulation/Gait Assistance: 3: Mod assist Ambulation/Gait Assistance Details (indicate cue type and reason): pt's right arm over PT shoulder, 5 feet forward, then reverse Ambulation Distance (Feet): 5 Feet Assistive device: 1 person hand held assist Gait Pattern: Decreased stride length;Trunk flexed   ADL: ADL Grooming: Simulated;Wash/dry face;Moderate assistance Where Assessed - Grooming: Supine, head of bed up Upper Body Bathing: Performed;Chest;Right arm;Left arm;Abdomen;Moderate assistance Upper Body Bathing Details (indicate cue type and reason): Needed assist to lift breasts so she can wash under breasts using right UE. Pt did much better with sitting balance and tolerance at EOB. Where Assessed - Upper Body Bathing: Unsupported;Sitting, bed Lower Body Bathing: Performed;Moderate assistance Lower Body Bathing Details (indicate cue type and reason): attempted to stand to wash periareas to facilitate standing tolerance/balance but pt became warm and dizzy. Sat back down and performed rest of LB bathing supine. Pt min assist for sit to stand. Pt reports discomfort in bottom crease area. Note redness. Notified nursing who applied barrier cream. Where Assessed - Lower Body Bathing: Rolling right and/or left;Sit to stand from bed Upper Body Dressing: Simulated;Moderate assistance Upper Body Dressing Details (indicate cue type and reason): simulated with  gown Where Assessed - Upper Body Dressing: Supine, head of bed flat Lower Body Dressing: Performed;Maximal assistance Lower Body Dressing Details (indicate cue type and reason): assisted pt to remove ASOs as pt complaining they are making her feet sweat. Note several small red areas on front of left lower leg. Notified nursing. Left ASOs off to remove pressure to these areas. total assist with ASOs to doff as they are very snuggly tied. Worked on donning slippers EOB to stand. After pt had to return supine from feeling warm and dizzy, worked on  donning pj bottoms from bed level and rolling to pull up.   Where Assessed - Lower Body Dressing: Sitting, bed;Unsupported;Rolling right and/or left Toilet Transfer: +2 Total assistance;Comment for patient %;Other (comment) (less than 20%) Toilet Transfer Details (indicate cue type and reason): stand pivot to chair. Attempted to stand with RW but unable to support self in standing with RW. Very weak in legs. Toilet Transfer Method: Stand pivot Toileting - Clothing Manipulation: +2 Total assistance;Comment for patient %;Other (comment) (0) Where Assessed - Toileting Clothing Manipulation: Standing Toileting - Hygiene: +2 Total assistance;Comment for patient %;Other (comment) (0) Where Assessed - Toileting Hygiene: Standing Tub/Shower Transfer: Not assessed Tub/Shower Transfer Method: Not assessed ADL Comments: Pt able to answer a simple question through vocal response within 3 seconds related to her ADL   Cognition: Cognition Overall Cognitive Status: Impaired Arousal/Alertness: Awake/alert Orientation Level: Oriented X4 Behaviors: Restless Comments:  Difficult to fully assess due to trach. Cognition Arousal/Alertness: Awake/alert Overall Cognitive Status: Appears within functional limits for tasks assessed Orientation Level: Oriented X4     Blood pressure 151/91, pulse 99, temperature 99.1 F (37.3 C), temperature source Oral, resp. rate 16,  height 5\' 1"  (1.549 m), weight 84.1 kg (185 lb 6.5 oz), SpO2 98.00%. Physical Exam  Constitutional: She appears well-developed.   HENT:   Head: Normocephalic.   Neck: Neck supple.  Trach tube in place   Cardiovascular: Normal rate and regular rhythm.   Pulmonary/Chest: Breath sounds normal. She has no wheezes.  Comfortably breathing.   Trach in place with PMV. Good phonation.  No distress.   Abdominal: She exhibits no distension. There is no tenderness.  Musculoskeletal: She exhibits no edema.  Left elbow with limited arom and prom. Couldn't extend past -50 degrees and had significant pain with ROM or left shoulder abduction. Left ankle only mildly tender with passive and active range. Right knee with sleeve and is generally tender without significant swelling.  Neurological: She is alert.  Names person place and situation. Follows three-step commands. Behavior a bit labile and impulsive. Insight fair to good.  Behaviorally she was more appropriate today. Strength grossly 4/5 RUE.  LUE limited proximally due to radius fx.Marland Kitchen  LHI are 4-5/5. Marland Kitchen Lower extremity strength grossly 3/5 proximal to 4/5 distally with pain inhibition at right knee noted.   Skin:  Left elbow surgery site healing nicely  Psychiatric:  She is anxious but cooperative with exam      No results found for this or any previous visit (from the past 48 hour(s)). Dg Elbow 2 Views Left   08/29/2011  *RADIOLOGY REPORT*  Clinical Data: Postop  LEFT ELBOW - 2 VIEW  Comparison: Preop elbow films of 07/30/2011  Findings: A metallic fixation device is present through the radial head.  A smaller fixation device is noted within the capitellum. Bone fragments from prior trauma are noted adjacent to the radial neck.  IMPRESSION: Postop changes for repair of radial head fracture.  Original Report Authenticated By: Juline Patch, M.D.     Post Admission Physician Evaluation: Functional deficits secondary  to deconditioning after  respiratory failure and left radius and fibular fx's. Patient is admitted to receive collaborative, interdisciplinary care between the physiatrist, rehab nursing staff, and therapy team. Patient's level of medical complexity and substantial therapy needs in context of that medical necessity cannot be provided at a lesser intensity of care such as a SNF. Patient has experienced substantial functional loss from his/her baseline which was documented above under the "Functional History" and "Functional Status" headings.  Judging by the patient's diagnosis, physical exam, and functional history, the patient has potential for functional progress which will result in measurable gains while on inpatient rehab.  These gains will be of substantial and practical use upon discharge  in facilitating mobility and self-care at the household level. Physiatrist will provide 24 hour management of medical needs as well as oversight of the therapy plan/treatment and provide guidance as appropriate regarding the interaction of the two. 24 hour rehab nursing will assist with bladder management, bowel management, safety, skin/wound care, disease management, medication administration, pain management and patient education  and help integrate therapy concepts, techniques,education, etc. PT will assess and treat for:  fxnl mobility, ROM, adaptive equipiment, transfers.  Goals are: modified independent to supervision. OT will assess and treat for: upper ext strength, rom, safety, ADL's, fxnl mobility, adaptive equipment.   Goals are:  modified independent to min assist. SLP will assess and treat for: speech intelligibility, trach.  Goals are: modified independent. Case Management and Social Worker will assess and treat for psychological issues and discharge planning. Team conference will be held weekly to assess progress toward goals and to determine barriers to discharge. Patient will receive at least 3 hours of therapy per day at  least 5 days per week. ELOS and Prognosis: 3 weeks excellent     Medical Problem List and Plan: 1. Deconditioning after respiratory failure-Follow critical care medicine as needed   2. 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 Speech therapy followup with PMV valve. Current plan is to continue tracheostomy tube until followup in the outpatient setting with Dr. Jenne Pane of ENT. Check oxygen saturations every shift and applied 2 L oxygen for saturations less than 90%.  Pt is doing quite well with her PMV.  I wonder if this could be downsized before she goes home?  3. DVT Prophylaxis/Anticoagulation: SCDs and support hose. Monitor for deep vein thrombosis. Recent upper and lower Dopplers negative  4. Pain Management: Currently using Tylenol only and will monitor with increased activity.  Pain is an issue at the left elbow.  5. left radial head fracture-S/P open reduction internal fixation November 24. Nonweightbearing at the left elbow. Removable splint for comfort. Patient may do active range of motion of elbow wrist and forearm with no passive ROM.  Elbow remains quite limited with a flexion contracture.  I'm unsure that she will ever have full extension in this arm again unfortunately.   6. Left distal fibula fracture-weightbearing as tolerated per Dr. Melvyn Novas.  Pain not a huge issue at the ankle.  7. Hypertension-Lasix daily. Monitor with increased activity  8. Anxiety/depression-continue Cymbalta 30 mg daily and Seroquel 100 mg twice daily. Provide emotional support and positive reinforcement.    08/31/2011, 7:39 AM

## 2011-09-01 DIAGNOSIS — J9600 Acute respiratory failure, unspecified whether with hypoxia or hypercapnia: Secondary | ICD-10-CM

## 2011-09-01 DIAGNOSIS — Z5189 Encounter for other specified aftercare: Secondary | ICD-10-CM

## 2011-09-01 DIAGNOSIS — R5381 Other malaise: Secondary | ICD-10-CM

## 2011-09-01 DIAGNOSIS — S52123A Displaced fracture of head of unspecified radius, initial encounter for closed fracture: Secondary | ICD-10-CM

## 2011-09-01 NOTE — Progress Notes (Signed)
Physical Therapy Assessment and Plan  Patient Details  Name: [name] MRN: [id number] Date of Birth: [birth year]  PT Diagnosis: Difficulty walking and Muscle weakness Rehab Potential: Good ELOS: 10-14 days   Today's Date: 09/01/2011 Time: 0902-1000 Time Calculation (min): 58 min  Assessment & Plan Clinical Impression: Patient is a 55 y.o. year old female with history of recent left elbow surgery with open reduction internal fixation of displaced radial head fracture on 08/06/2011 as well as left distal fibula fracture admitted to CIR on 08/31/2011 for generalized muscle weakness and deconditioning.  PTA, patient lived with boyfriend and adopted 90-year old grandson in 2 level house (20 steps) with 2 steps to enter (1 step onto porch; 1 threshold step).  PTA, patient was independent with IADL's, ADL's, transfers and all functional mobility.  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  Patient currently presents with generalized muscle weakness, and impaired gait.  Patient will benefit from skilled inpatient PT to address the above impairments and provide family education for safe discharge home at modified independence level of supervision.  PT - End of Session Activity Tolerance: Tolerates < 10 min activity with changes in vital signs Endurance Deficit: Yes Endurance Deficit Description: tolerates short bouts of activity requiring frequent rest breaks PT Assessment Rehab Potential: Good Barriers to Discharge: None PT Plan PT Frequency: 1-2 X/day, 60-90 minutes Estimated Length of Stay: 10-14 days PT Treatment/Interventions: Ambulation/gait training;Community reintegration;DME/adaptive equipment instruction;Functional mobility training;Neuromuscular re-education;Pain management;Patient/family education;Stair training;Splinting/orthotics;Therapeutic Activities;Therapeutic Exercise;UE/LE Strength  taining/ROM;UE/LE Coordination activities;Wheelchair propulsion/positioning PT Recommendation Follow Up Recommendations: Outpatient PT Equipment Recommended: TBD Equipment Details: TBD  Precautions/Restrictions Precautions Precautions: Fall Required Braces or Orthoses: Yes Other Brace/Splint: Bilat ASO during mobility Restrictions Weight Bearing Restrictions: Yes LUE Weight Bearing: Non weight bearing LLE Weight Bearing: Weight bearing as tolerated Other Position/Activity Restrictions: no weight bearing through left elbow General Reviewed Chart: Yes Vital Signs HR <126bpm and SpO2>94% on room air throughout entire session.  Patient c/o dizziness and fatigue in standing; verbal cues for pursed lip breathing with good return demonstration and sx's subsided in ~30sec. Pain Pain Assessment Pain Assessment: 0-10 Pain Score:   5 Pain Type: Acute pain Pain Location: Elbow Pain Orientation: Left Pain Descriptors: Aching Pain Onset: On-going Patients Stated Pain Goal: 0 Pain Intervention(s): RN made aware;Emotional support Multiple Pain Sites: No Home Living/Prior Functioning Home Living Lives With: Significant other;Other (Comment) (lives with boyfriend and cares for 9-yr old adopted grandson) Receives Help From: Family (daughter) Type of Home: House Home Layout: Two level;Bed/bath upstairs;Other (Comment) (can sleep on main floor, but only full bath is upstairs; 1/2 bath on main level) Alternate Level Stairs-Rails: Left Alternate Level Stairs-Number of Steps: 20 Home Access: Stairs to enter;Other (comment) (1 step onto porch; 1 threshold step) Entrance Stairs-Rails: None Entrance Stairs-Number of Steps: 2 (1 step onto porch; 1 threshold step) Bathroom Shower/Tub: Health visitor: Standard Bathroom Accessibility: Yes How Accessible: Accessible via walker Home Adaptive Equipment: Quad cane;Wheelchair - manual;Walker - rolling;Shower chair with back Additional  Comments: has DME from father-in-law who passed away 07-19-23.  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; per pt) Leisure: Hobbies-no Cognition Arousal/Alertness: Awake/alert Orientation Level: Oriented X4 Sensation Sensation Light Touch: Appears Intact  Stereognosis: Not tested Hot/Cold: Not tested Proprioception: Appears Intact Coordination Gross Motor Movements are Fluid and Coordinated: Yes Fine Motor Movements are Fluid and Coordinated: Yes Motor  Motor Motor: Within Functional Limits Motor - Skilled Clinical Observations: overall generalized weakness  Mobility Bed Mobility Bed Mobility: Yes Supine to Sit: 3: Mod assist Sitting - Scoot to Edge of Bed: 5: Supervision Sit to Supine - Right: 5: Supervision Transfers Transfers: Yes Sit to Stand: 3: Mod assist Stand to Sit: 4: Min assist Stand Pivot Transfers: 3: Mod assist Locomotion  Ambulation Ambulation: Yes Ambulation/Gait Assistance: 3: Mod assist Ambulation Distance (Feet): 8 Feet Assistive device: 1 person hand held assist Ambulation/Gait Assistance Details: Other (comment) (verbal cues for tall posture and pursed lip breathing) Ambulation/Gait Assistance Details (indicate cue type and reason): verbal cues for tall posture and pursed lip breathing Gait Gait: Yes Gait Pattern: Impaired Gait Pattern: Step-through pattern;Decreased step length - left;Decreased step length - right;Right circumduction;Left circumduction;Shuffle;Lateral trunk lean to right;Lateral trunk lean to left;Trunk flexed Stairs / Additional Locomotion Stairs: Yes Stairs Assistance: 3: Mod assist Stair Management Technique: One rail Right;Forwards Number of Stairs: 3    Height of Stairs: 4  Corporate treasurer: Yes Wheelchair Assistance: 5: Financial planner Details: Verbal cues for Diplomatic Services operational officer: Right upper extremity;Both lower extermities Wheelchair Parts Management: Needs assistance (needs help for parts management) Distance: >171ft  Trunk/Postural Assessment  Cervical Assessment Cervical Assessment: Within Functional Limits Thoracic Assessment Thoracic Assessment: Within Functional Limits Lumbar Assessment Lumbar Assessment: Within Functional Limits Postural Control Postural Control: Within Functional Limits  Balance Balance Balance Assessed: Yes Static Sitting Balance Static Sitting - Balance Support: Right upper extremity supported Static Sitting - Level of Assistance: 6: Modified independent (Device/Increase time) Dynamic Sitting Balance Dynamic Sitting - Balance Support: Right upper extremity supported;Feet supported Dynamic Sitting - Level of Assistance: 5: Stand by assistance Static standing balance: min A with 1 UE support Dynamic standing balance: mod A with 1 UE support  Extremity Assessment  RLE Assessment RLE Assessment:  (AROM WFL; grossly 4/5; knee extension pain limited) LLE Assessment LLE Assessment:  (AROM WFL; grossly 4/5; knee extension pain limited)  Recommendations for other services: None  Discharge Criteria: Patient will be discharged from PT if patient refuses treatment 3 consecutive times without medical reason, if treatment goals not met, if there is a change in medical status, if patient makes no progress towards goals or if patient is discharged from hospital.  The above assessment, treatment plan, treatment alternatives and goals were discussed and mutually agreed upon: by patient.  PT treatment Initiated during session: Gait training with HHA, mod A 40'.  Cues for posture, glut squeezing due to increased trunk flexion and lateral sway during gait.  Pt  educated on stair technique, attempted stairs second time but pt unable due to fatigue, decreased strength.  W/c training with R UE and B LE's. Cues for efficient technique.  Christianne Dolin 09/01/2011, 10:46 AM

## 2011-09-01 NOTE — Progress Notes (Signed)
Patient ID: [name], female   DOB: 09-08-1956, 55 y.o.   MRN: [id number] Subjective/Complaints: Review of Systems  Musculoskeletal: Positive for joint pain.  All other systems reviewed and are negative.   Slept fairly well  Objective: Vital Signs: Blood pressure 131/80, pulse 101, temperature 98 F (36.7 C), temperature source Oral, resp. rate 20, SpO2 98.00%. No results found. No results found for this basename: WBC:2,HGB:2,HCT:2,PLT:2 in the last 72 hours No results found for this basename: NA:2,K:2,CL:2,CO2:2,GLUCOSE:2,BUN:2,CREATININE:2,CALCIUM:2 in the last 72 hours CBG (last 3)  No results found for this basename: GLUCAP:3 in the last 72 hours  Wt Readings from Last 3 Encounters:  08/30/11 84.1 kg (185 lb 6.5 oz)  08/30/11 84.1 kg (185 lb 6.5 oz)  08/05/11 93.8 kg (206 lb 12.7 oz)    Physical Exam:  General appearance: alert, cooperative and no distress Head: Normocephalic, without obvious abnormality, atraumatic Eyes: conjunctivae/corneas clear. PERRL, EOM's intact. Fundi benign. Ears: normal TM's and external ear canals both ears Nose: Nares normal. Septum midline. Mucosa normal. No drainage or sinus tenderness. Throat: lips, mucosa, and tongue normal; teeth and gums normal and trach site clean. #6 trach with PMV Neck: no adenopathy, no carotid bruit, no JVD, supple, symmetrical, trachea midline and thyroid not enlarged, symmetric, no tenderness/mass/nodules Back: symmetric, no curvature. ROM normal. No CVA tenderness. Resp: clear to auscultation bilaterally Cardio: regular rate and rhythm, S1, S2 normal, no murmur, click, rub or gallop GI: soft, non-tender; bowel sounds normal; no masses,  no organomegaly Extremities: extremities normal, atraumatic, no cyanosis or edema Pulses: 2+ and symmetric Skin: Skin color, texture, turgor normal. No rashes or lesions Neurologic: Patient with generalized weakness in all 4 limbs prox greater than distal.  Patient limited  due to pain at left elbow and shoulder. Musc: -50 degrees ext at left elbow. Incision/Wound: wounds clean and intact   Assessment/Plan: 1. Functional deficits secondary to acute respirator failure and recent left radius and fibular fx's  which require 3+ hours per day of interdisciplinary therapy in a comprehensive inpatient rehab setting. Physiatrist is providing close team supervision and 24 hour management of active medical problems listed below. Physiatrist and rehab team continue to assess barriers to discharge/monitor patient progress toward functional and medical goals. Mobility: Bed Mobility Supine to Sit: 3: Mod assist Sitting - Scoot to Edge of Bed: 4: Min assist Transfers Sit to Stand: 3: Mod assist Stand Pivot Transfers: 3: Mod assist     ADL:   Cognition: Cognition Overall Cognitive Status: Impaired Arousal/Alertness: Awake/alert Orientation Level: Oriented X4 Attention: Sustained Sustained Attention: Appears intact Memory: Appears intact Awareness: Impaired Awareness Impairment: Emergent impairment Problem Solving: Impaired Problem Solving Impairment: Functional basic Executive Function: Organizing;Decision Making Organizing: Impaired Organizing Impairment: Verbal basic;Functional basic Decision Making: Impaired Decision Making Impairment: Verbal basic;Functional basic Behaviors: Restless;Poor frustration tolerance Safety/Judgment: Appears intact Cognition Arousal/Alertness: Awake/alert Orientation Level: Oriented X4  1. Deconditioning after respiratory failure-Follow critical care medicine as needed   2. 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 Speech therapy followup with PMV valve. Current plan is to continue tracheostomy tube until followup in the outpatient setting with Dr. Jenne Pane of ENT. Check oxygen saturations every shift and applied 2 L oxygen for saturations less than 90%. I really think that we can downsize to #4 as she is  comfortable and has few secretions.  Will discuss with ENT first.  3. DVT Prophylaxis/Anticoagulation: SCDs and support hose. Monitor for deep vein thrombosis. Recent upper and lower Dopplers negative   4. Pain  Management: Currently using Tylenol only and will monitor with increased activity. Pain is an issue at the left elbow   5. left radial head fracture-S/P open reduction internal fixation November 24. Nonweightbearing at the left elbow. Removable splint for comfort. Patient may do active range of motion of elbow wrist and forearm with no passive ROM. Elbow remains quite limited with a flexion contracture. I'm unsure that she will ever have full extension in this arm again unfortunately.   6. Left distal fibula fracture-weightbearing as tolerated per Dr. Melvyn Novas. Pain not a huge issue at the ankle.    7. Hypertension-Lasix daily. Monitor with increased activity   8. Anxiety/depression-continue Cymbalta 30 mg daily and Seroquel 100 mg twice daily. Provide emotional support and positive reinforcement. Trazodone was quite helpful last night for sleep.        LOS (Days) 1 A FACE TO FACE EVALUATION WAS PERFORMED  Natiya Seelinger T 09/01/2011, 8:45 AM

## 2011-09-01 NOTE — Progress Notes (Signed)
Occupational Therapy Assessment and Plan  Patient Details  Name: [name] MRN: [id number] Date of Birth: September 05, 1956  OT Diagnosis: acute pain, muscle weakness (generalized) and pain in joint Rehab Potential: Rehab Potential: Good ELOS: 2 weeks   Today's Date: 09/01/2011 Time: 0730-0830 Time Calculation (min): 60 min  Assessment & Plan Clinical Impression: Patient is a 55 y.o. year old right-handed female recent left elbow surgery with open reduction internal fixation of displaced radial head fracture per Dr. Melvyn Novas on November 24 as well as left distal fibula fracture. She was discharged home admitted on November 25 with altered mental status with persistent hypoxia felt to be related to possible narcotics and sleeping pill. Urine drug screen positive for opiates and benzodiazepines. Patient with progressive respiratory distress and received followed by critical care medicine for suggested aspiration event. She would ultimately receive tracheostomy and changed to #6 cuffless on December 12. Overall mental status has improved but still altered suggesting encephalopathy. No cranial CT scan was noted. Working with Passy-Muir valve per speech therapy. She remains nonweightbearing to left upper extremity after recent elbow surgery. Advised long all arm removable splint and may do active range of motion of elbow wrist and forearm with no passive mobility and no weightbearing on left elbow. She may wear the splint out of bed for comfort does not have to wear it continually. Upper and lower extremity venous Dopplers were negative for deep vein thrombosis. She is weightbearing as tolerated to the left lower extremity and also received ASO on left ankle and right ankle per her request. Tracheostomy tube remains in place with speech therapy followup and using PMV. Recent direct laryngoscopy per Dr. Jenne Pane of ENT Notes posterior larynx was markedly edematous and the vocal folds were markedly inflamed and  no upper tracheal narrowing noted .It was advised to keep her tracheostomy tube in place during her rehabilitation stay and followup with ENT outpatient to evaluate safe timing for decannulation in the outpatient setting.      Patient transferred to CIR on 08/31/2011 .  Patient's past medical history is significant for: . Ovarian cyst  . Pneumonia  hx x4  . Cancer  precancerous cervix  . GERD (gastroesophageal reflux disease)  . Headache  . Hiatal hernia  . PONV (postoperative nausea and vomiting) 08/06/11  . Difficult intubation  . Aspiration pneumonia  . Respiratory failure  Past Surgical History  Procedure Date  . Cesarean section 85  . Knee surgery 14  rt arthroscopy  . Ovarian cyst removal 80  . Abdominal hysterectomy 86  . Orif elbow fracture 08/06/2011  Procedure: OPEN REDUCTION INTERNAL FIXATION (ORIF) ELBOW/OLECRANON FRACTURE; Surgeon: Sharma Covert; Location: [facility]; Service: Orthopedics; Laterality: Left;  . Direct laryngoscopy 08/19/2011  Procedure: DIRECT LARYNGOSCOPY; Surgeon: Antony Contras; Location: [facility]; Service: ENT; Laterality: N/A;  . Tracheostomy tube placement 08/19/2011  Procedure: TRACHEOSTOMY; Surgeon: Antony Contras; Location: [facility]; Service: ENT; Laterality: N/A; tracheoscopy    Patient currently requires max with basic self-care skills secondary to muscle weakness, decreased cardiorespiratoy endurance, abnormal tone, decreased attention, decreased awareness, decreased problem solving and delayed processing, and decreased standing balance, decreased balance strategies and difficulty maintaining precautions, decreased ROM and functional use of left UE.  Prior to hospitalization, patient could complete ADL with independence prior to elbow fx and ARF.  Patient will benefit from skilled intervention to increase independence with basic self-care skills prior to discharge home with care partner.  Anticipate patient will require intermittent supervision and  follow up  home health.  OT - End of Session Activity Tolerance: Tolerates < 10 min activity with changes in vital signs (increased HR 130 with minimal activity) OT Assessment Rehab Potential: Good OT Plan OT Frequency: 1-2 X/day, 60-90 minutes Estimated Length of Stay: 2 weeks OT Treatment/Interventions: Community reintegration;Functional mobility training;Self Care/advanced ADL retraining;Therapeutic Exercise;UE/LE Strength taining/ROM;Neuromuscular re-education;DME/adaptive equipment instruction;Balance/vestibular training;Cognitive remediation/compensation;Patient/family education;Therapeutic Activities;Pain management;UE/LE Coordination activities OT Recommendation Follow Up Recommendations: Home health OT Equipment Recommended: 3 in 1 bedside comode  Precautions/Restrictions  Precautions Precautions: Fall Required Braces or Orthoses: Yes Other Brace/Splint: ASO for OOB activity Restrictions Weight Bearing Restrictions: Yes LUE Weight Bearing: Non weight bearing Other Position/Activity Restrictions: no weight bearing through left elbow General Chart Reviewed: Yes Family/Caregiver Present: No Vital Signs Therapy Vitals Temp: 98 F (36.7 C) Temp src: Oral Pulse Rate: 101  Resp: 20  BP: 131/80 mmHg Patient Position, if appropriate: Lying Oxygen Therapy SpO2: 98 % O2 Device: None (Room air) Pain Pain Assessment Pain Score:   5 Pain Type: Acute pain Pain Location: Arm Pain Orientation: Left Pain Descriptors: Aching Pain Onset: On-going Pain Intervention(s): RN made aware Home Living/Prior Functioning Home Living Lives With:  (boyfriend) Receives Help From: Family (daughter, Annice Pih and Fredrik Cove) Type of Home: House Home Layout: Two level;Bed/bath upstairs;1/2 bath on main level Bathroom Shower/Tub: Engineer, manufacturing systems: Standard Bathroom Accessibility: Yes How Accessible: Accessible via walker Home Adaptive Equipment: Shower chair with  back ADL ADL Eating: Set up Grooming: Minimal assistance Where Assessed-Grooming: Sitting at sink Upper Body Bathing: Maximal assistance Lower Body Bathing: Dependent Where Assessed-Lower Body Bathing: Standing at sink;Sitting at sink Upper Body Dressing: Moderate assistance Where Assessed-Upper Body Dressing: Sitting at sink Lower Body Dressing: Dependent Toileting: Dependent Where Assessed-Toileting: Teacher, adult education: Moderate assistance Toilet Transfer Method: Stand pivot Toilet Transfer Equipment: Drop arm bedside commode Vision/Perception  Vision - History Baseline Vision: Wears glasses only for reading Patient Visual Report: No change from baseline Vision - Assessment Eye Alignment: Within Functional Limits Perception Perception: Within Functional Limits Praxis Praxis: Intact  Cognition Overall Cognitive Status: Impaired Arousal/Alertness: Awake/alert Orientation Level: Oriented X4 Attention: Sustained Sustained Attention: Appears intact Memory: Appears intact Awareness: Impaired Awareness Impairment: Emergent impairment Problem Solving: Impaired Problem Solving Impairment: Functional basic Executive Function: Organizing;Decision Making Organizing: Impaired Organizing Impairment: Verbal basic;Functional basic Decision Making: Impaired Decision Making Impairment: Verbal basic;Functional basic Behaviors: Restless;Poor frustration tolerance Safety/Judgment: Appears intact Sensation Sensation Light Touch: Appears Intact Stereognosis: Appears Intact Hot/Cold: Appears Intact Proprioception: Appears Intact Coordination Gross Motor Movements are Fluid and Coordinated: Yes Fine Motor Movements are Fluid and Coordinated: Yes Motor  Motor Motor - Skilled Clinical Observations: overall generalized weakness  Mobility  Bed Mobility Supine to Sit: 3: Mod assist Supine to Sit Details: Manual facilitation for weight shifting Sitting - Scoot to Edge of Bed: 4:  Min assist Transfers Sit to Stand: 3: Mod assist  Trunk/Postural Assessment  Thoracic Assessment Thoracic Assessment:  (kyphotic ) Lumbar Assessment Lumbar Assessment: Within Functional Limits Postural Control Postural Control: Within Functional Limits  Balance Static Sitting Balance Static Sitting - Balance Support: Right upper extremity supported Static Sitting - Level of Assistance: 6: Modified independent (Device/Increase time) Dynamic Sitting Balance Dynamic Sitting - Balance Support: During functional activity;Right upper extremity supported Dynamic Sitting - Level of Assistance: 5: Stand by assistance Extremity/Trunk Assessment RUE Assessment RUE Assessment: Within Functional Limits RUE Strength RUE Overall Strength Comments: AROM WFL, strengthe 3+/5 LUE Assessment LUE Assessment: Exceptions to [facility] LUE Strength LUE Overall Strength Comments: Pt with recent  elbow surgery, AAROM in elbow 0-110 wtih discomfort, decreased wrist ROM, able to move fingers, no splint on UE, shoulder 0-50  Recommendations for other services: None  Discharge Criteria: Patient will be discharged from OT if patient refuses treatment 3 consecutive times without medical reason, if treatment goals not met, if there is a change in medical status, if patient makes no progress towards goals or if patient is discharged from hospital.  The above assessment, treatment plan, treatment alternatives and goals were discussed and mutually agreed upon: by patient  Treatment  730-830(30min) 1:1 OT eval initiated, OT purpose, role and goals discussed. Self care retraining at EOB focusing on sit to stand, stand pivot transfers bed to drop arm commode to w/c, standing tolerance with right UE support, activity tolerance (HR increased with minimal activity (to 130s), total A with dressing, AROM for left UE. [name] 09/01/2011, 8:52 AM

## 2011-09-01 NOTE — Progress Notes (Signed)
Patient information reviewed and entered into [facility] system by Debarah Mccumbers, RN, CRRN, PPS Coordinator.  Information including medical coding and functional independence measure will be reviewed and updated through discharge.    

## 2011-09-01 NOTE — Progress Notes (Signed)
Social Work Assessment and Plan Assessment and Plan  Patient Name: [name]  [id number] Date: 09/01/2011  Problem List:  Patient Active Problem List  Diagnoses  . ANXIETY  . DEPRESSION  . ACUTE SINUSITIS, UNSPECIFIED  . SHOULDER PAIN, CHRONIC  . HIP PAIN, CHRONIC  . NECK PAIN, CHRONIC  . Radial head fracture, closed  . Stridor  . Respiratory failure  . Aspiration pneumonia  . Hypoxia  . Tracheostomy dependent  . Physical deconditioning  . Encephalopathy acute  . ARF (acute respiratory failure)    Past Medical History:  Past Medical History  Diagnosis Date  . Ovarian cyst   . Pneumonia     hx x4  . Cancer     precancerous cervix  . GERD (gastroesophageal reflux disease)   . Headache   . Hiatal hernia   . PONV (postoperative nausea and vomiting) 08/06/11  . Difficult intubation   . Aspiration pneumonia   . Respiratory failure     Past Surgical History:  Past Surgical History  Procedure Date  . Cesarean section 85  . Knee surgery 14    rt arthroscopy  . Ovarian cyst removal 80  . Abdominal hysterectomy 86  . Orif elbow fracture 08/06/2011    Procedure: OPEN REDUCTION INTERNAL FIXATION (ORIF) ELBOW/OLECRANON FRACTURE;  Surgeon: Sharma Covert;  Location: [facility];  Service: Orthopedics;  Laterality: Left;  . Direct laryngoscopy 08/19/2011    Procedure: DIRECT LARYNGOSCOPY;  Surgeon: Antony Contras;  Location: [facility];  Service: ENT;  Laterality: N/A;  . 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  Describes her unintentional OD on pain meds and her distress that "my 53 year old had to be the one to find me" .  Denies any s/s significant emotional distress currently.  Depression screen done ("BDI = 0) and no psychiatirc follow up warranted at this time but will monitor. Recent Psychosocial Issues: None in past two years except chronic orthopedic issues and limitations Pyschiatric History: Pt reports period of "depression" following husband's death from cancer approx. 8 years ago yet no current psych issues/ treatment  Patient/Family Perceptions, Expectations & Goals Pt/Family Perceptions, Expectations and Goals Pt/Family understanding of illness & functional limitations: pt and daughter able to report on the basic course of her medical decline after elbow surgery - pt cites "had an adverse reaction to pain medicines I was taking.Marland KitchenMarland KitchenI barely made it to the hospital in time..."  Both with good awareness of pt's current functional limitation as well, and need for CIR Premorbid pt/family roles/activities: independent overall but limited in mobility and endurance due to chronic ortho issues Anticipated changes in roles/activities/participation: may require family to assume caregiver roles until pt regains full independence.  Daughter,  Annice Pih, to likely be primary support Pt/family expectations/goals: Marland Kitchen..would love to be able to do more for myself!"  [employer] Agencies: None Premorbid Home  Care/DME Agencies: None Transportation available at discharge: yes  Discharge Visual merchandiser Resources: Media planner (specify) Wellsite geologist) Financial Resources: Employment;Family Support Financial Screen Referred: No Living Expenses: Own Money Management: Patient;Family Home Management: pt and boyfriend Patient/Family Preliminary Plans: pt plans to return home with boyfriend (of 5 years) who does work, but daughter available to stay while he is out.  Clinical Impression:  Pleasant, talkative woman here after accidental overdose.  Daughter at bedside and agrees with pt that they have seen much improvement in pt's physical and cognitive function over past few days.  Pt very motivated for therapy and to get rid of trach prior to d/c.  No significant emotional distress noted/ reported but will monitor.  Megan Salon, Daleisa Halperin 09/01/2011

## 2011-09-01 NOTE — Progress Notes (Signed)
Physical Therapy Session Note  Patient Details  Name: [name] MRN: [id number] Date of Birth: 1956-07-11  Today's Date: 09/01/2011 Time: 5621-3086 Time Calculation (min): 32 min  Precautions: Precautions Precautions: Fall Required Braces or Orthoses: Yes Other Brace/Splint: Bilat ASO during mobility Restrictions Weight Bearing Restrictions: Yes LUE Weight Bearing: Non weight bearing (non weight bearing through elbow) LLE Weight Bearing: Weight bearing as tolerated Other Position/Activity Restrictions: no weight bearing through left elbow  Short Term Goals: PT Short Term Goal 1: pt will gait >110ft min A controlled environment PT Short Term Goal 2: pt will perform functional transfers min A PT Short Term Goal 3: pt will negotiate 3 stairs min A  Skilled Therapeutic Interventions/Progress Updates:  Ambulation/gait training;Community reintegration;DME/adaptive equipment instruction;Functional mobility training;Neuromuscular re-education;Pain management;Patient/family education;Stair training;Splinting/orthotics;Therapeutic Activities;Therapeutic Exercise;UE/LE Strength taining/ROM;UE/LE Coordination activities;Wheelchair propulsion/positioning   Vital Signs HR<119bpm and SpO2>94% on room air throughout session.  C/o of dizziness when standing and sitting up.  Verbal cues for pursed lip breathing with good return demonstration.  Sx's ~ 15sec to pass. Pain Pain Assessment Pain Assessment: 0-10 Pain Score:   6 Pain Type: Acute pain Pain Location: Knee Pain Orientation: Right Pain Descriptors: Aching Pain Onset: On-going Patients Stated Pain Goal: 0 Pain Intervention(s): Emotional support Multiple Pain Sites: Yes 2nd Pain Site Pain Score: 6 Pain Type: Acute pain (R ankle) Pain Location: Ankle Pain Descriptors: Aching Mobility Sit>Stand max A; pt c/o R knee/ankle pain 6/10 when trying to stand and during gait.  Large anterior weight shift during sit>stand.  Verbal cues  to scoot to the edge of the bed and keep her chest up while trying to sit>stand. Stand>sit min A; uncontrolled descent.  Verbal cues for hand placement to reach back and find chair. Locomotion  Gait ~130ft mod with hand held assist, focusing on walking tall and activating gluteals for hip stability.  Verbal cues to "stand tall" and "squeeze your butt every time your foot is on the ground."  Gait = step-through pattern with shortened step length bilat, bilat trunk lean, flexed trunk and bilat circumduction with LE's.  Fatigue is main limiting factor and patient requires frequent rest breaks during gait.  Patient often c/o dizziness during gait; verbal cues for pursed lip breathing with good return demonstration with sx's passing <30sec.    W/c mobility >14ft supervision needs assist for parts management (unable to unlock L break on w/c due to LUE weight-bearing restrictions).  RUE and bilat LE's propulsion for w/c.  Therapy/Group: Individual Therapy  Christianne Dolin 09/01/2011, 2:40 PM

## 2011-09-01 NOTE — Discharge Summary (Signed)
Dc to inpatient rehab. Will follow with Dr Jenne Pane for trach removal. Dc planning > 35 minues

## 2011-09-01 NOTE — Progress Notes (Signed)
Speech Language Pathology Assessment and Plan  Patient Details  Name: [name] MRN: [id number] Date of Birth: [birth year]  SLP Diagnosis: Cognitive impairment, communication impairment, pharyngeal dysphagia Rehab Potential: Good ELOS: TBD  Time: 4098-1191 Time Calculation (min): 55 min  Session 1: Pt administered cognitive-linguist evaluation and BSE. Please see below for details.   Assessment & Plan Clinical Impression: 55 year old right-handed female recent left elbow surgery with open reduction internal fixation of displaced radial head fracture per Dr. Melvyn Novas on November 24 as well as left distal fibula fracture. She was discharged home admitted on November 25 with altered mental status with persistent hypoxia felt to be related to possible narcotics and sleeping pill. Urine drug screen positive for opiates and benzodiazepines. Patient with progressive respiratory distress and received followed by critical care medicine for suggested aspiration event. She would ultimately receive tracheostomy and changed to #6 cuffless on December 12. Overall mental status has improved but still altered suggesting encephalopathy. No cranial CT scan was noted. Working with Passy-Muir valve per speech therapy. She remains nonweightbearing to left upper extremity after recent elbow surgery. Advised long all arm removable splint and may do active range of motion of elbow wrist and forearm with no passive mobility and no weightbearing on left elbow. She may wear the splint out of bed for comfort does not have to wear it continually. Upper and lower extremity venous Dopplers were negative for deep vein thrombosis. She is weightbearing as tolerated to the left lower extremity and also received ASO on left ankle and right ankle per her request. Tracheostomy tube remains in place with speech therapy followup and using PMV. Recent direct laryngoscopy per Dr. Jenne Pane of ENT Notes posterior larynx was markedly  edematous and the vocal folds were markedly inflamed and no upper tracheal narrowing noted .It was advised to keep her tracheostomy tube in place during her rehabilitation stay and followup with ENT outpatient to evaluate safe timing for decannulation in the outpatient setting. She is currently moderate assist for supine to sit and transfers via lift equipment. Ambulation of 5 feet and moderate assistance using hand-held assist. Inpatient rehabilitation services was consulted for questionable inpatient.   Pt. transferred to CIR on 08/31/11. Pt currently has a trach and wearing her PMSV during all waking hours and all PO's and demonstrating adequate phonation for functional communication. Pt demonstrates pharyngeal dysphagia characterized by penetration with sips of thin via straw. Pt also demonstrates cognitive impairments characterized by decreased working memory, emergent awareness and problem solving. Pt would benefit from skilled SLP services to maximize cognitive function, swallow safety with least restrictive diet and self-care with PMSV.    SLP - End of Session Patient left: in bed;with call bell in reach;with family/visitor present Nurse Communication: Aspiration precautions reviewed Assessment Rehab Potential: Good Barriers to Discharge: None Therapy Diagnosis: Cognitive Impairments;Other (comment) (communication impairment)  Precautions/Restrictions  Precautions Precautions: Fall Required Braces or Orthoses: Yes Other Brace/Splint: Bilat ASO during mobility Restrictions Weight Bearing Restrictions: Yes LUE Weight Bearing: Non weight bearing (non weight bearing through elbow) LLE Weight Bearing: Weight bearing as tolerated Other Position/Activity Restrictions: no weight bearing through left elbow  Pain Pain Assessment Pain Assessment: No/denies pain Pain Score:   5 Pain Type: Acute pain Pain Location: Elbow Pain Orientation: Left Pain Descriptors: Aching Pain Onset:  On-going Patients Stated Pain Goal: 0 Pain Intervention(s): RN made aware;Emotional support Multiple Pain Sites: No Prior Functioning Type of Home: House Lives With: Significant other;Other (Comment) (lives with boyfriend and cares for 9-yr old  adopted grandson) Receives Help From: Family (daughter) 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; per pt) Cognition Overall Cognitive Status: Impaired Arousal/Alertness: Awake/alert Orientation Level: Oriented X4 Attention: Selective Sustained Attention: Appears intact Selective Attention: Appears intact Memory: Impaired Memory Impairment: Decreased short term memory Decreased Short Term Memory: Functional basic (Pt did not utilize swallowing precautions of no straw) Awareness: Impaired Awareness Impairment: Emergent impairment Problem Solving: Appears intact Problem Solving Impairment: Verbal basic;Functional basic Executive Function: Decision Making Organizing: Appears intact Organizing Impairment: Verbal basic;Functional basic Decision Making: Impaired Decision Making Impairment: Functional basic Behaviors: Restless;Poor frustration tolerance Safety/Judgment: Appears intact Comprehension Auditory Comprehension Overall Auditory Comprehension: Appears within functional limits for tasks assessed Yes/No Questions: Within Functional Limits Commands: Within Functional Limits Conversation: Simple Visual Recognition/Discrimination Discrimination: Within Function Limits Reading Comprehension Reading Status: Not tested Expression Expression Primary Mode of Expression: Verbal Verbal Expression Overall Verbal Expression: Appears within functional limits for tasks assessed Initiation: No impairment Level of Generative/Spontaneous Verbalization: Conversation Repetition: No impairment Naming: No impairment Pragmatics: No impairment Other Verbal Expression Comments: Pt utilizing PMSV during all waking hours and  able to produce adequate phonation for functional communication.  Written Expression Dominant Hand: Right Written Expression: Not tested Oral/Motor Oral Motor/Sensory Function Overall Oral Motor/Sensory Function: Appears within functional limits for tasks assessed Motor Speech Overall Motor Speech: Appears within functional limits for tasks assessed Respiration: Within functional limits (SpO2:98% with PMSV on room air) Phonation: Normal (with PMSV) Resonance: Within functional limits Articulation: Within functional limitis Intelligibility: Intelligible Motor Planning: Witnin functional limits Motor Speech Errors: Not applicable   Recommendations for other services: None  Discharge Criteria: Patient will be discharged from SLP if patient refuses treatment 3 consecutive times without medical reason, if treatment goals not met, if there is a change in medical status, if patient makes no progress towards goals or if patient is discharged from hospital.  The above assessment, treatment plan, treatment alternatives and goals were discussed and mutually agreed upon: by patient and by family  Tallen Schnorr 09/01/2011 12:23 PM  Clinical/Bedside Swallow Evaluation Patient Details  Name: [name] MRN: [id number] DOB: [birth year] Today's Date: 09/01/2011  Past Medical History:  Past Medical History  Diagnosis Date  . Ovarian cyst   . Pneumonia     hx x4  . Cancer     precancerous cervix  . GERD (gastroesophageal reflux disease)   . Headache   . Hiatal hernia   . PONV (postoperative nausea and vomiting) 08/06/11  . Difficult intubation   . Aspiration pneumonia   . Respiratory failure    Past Surgical History:  Past Surgical History  Procedure Date  . Cesarean section 87  . Knee surgery 14    rt arthroscopy  . Ovarian cyst removal 80  . Abdominal hysterectomy 88  . Orif elbow fracture 08/06/2011    Procedure: OPEN REDUCTION INTERNAL FIXATION (ORIF)  ELBOW/OLECRANON FRACTURE;  Surgeon: Sharma Covert;  Location: [facility] OR;  Service: Orthopedics;  Laterality: Left;  . Direct laryngoscopy 08/19/2011    Procedure: DIRECT LARYNGOSCOPY;  Surgeon: Antony Contras;  Location: [facility] ORS;  Service: ENT;  Laterality: N/A;  . Tracheostomy tube placement 08/19/2011    Procedure: TRACHEOSTOMY;  Surgeon: Antony Contras;  Location: [facility] OR[facility];  Service: ENT;  Laterality: N/A;  tracheoscopy   Assessment/Recommendations/Treatment Plan  Pt seen with regular textures and thin liquids via cup and straw. Pt without overt coughing/thorat clear with liquids via straw but did demonstrate a slight change in vocal  quality. Pt recommended to continue current diet of regular textures and thin liquids via cup.   SLP Assessment Risk for Aspiration: Mild Other Related Risk Factors: History of pneumonia;History of GERD;Tracheostomy  Recommendations Solid Consistency: Regular Liquid Consistency: Thin Liquid Administration via: Cup;No straw Medication Administration: Whole meds with liquid Supervision: Patient able to self feed Compensations: Small sips/bites;Slow rate Postural Changes and/or Swallow Maneuvers: Seated upright 90 degrees (PMSV with all PO's) Other Recommendations: Place PMSV during PO intake  Treatment Plan Speech Therapy Frequency: min 3x week Treatment Duration: 2 weeks Interventions: Aspiration precaution training;Compensatory techniques;Diet toleration management by SLP;Patient/family education;Group dysphagia treatment  Prognosis Prognosis for Safe Diet Advancement: Good  Individuals Consulted Consulted and Agree with Results and Recommendations: Patient;Family member/caregiver Family Member Consulted: Daughter  Swallowing Goals  SLP Swallowing Goals Patient will consume recommended diet without observed clinical signs of aspiration with: Modified independent assistance Patient will utilize recommended strategies during swallow to increase swallowing  safety with: Modified independent assistance  Swallow Study Prior Functional Status  Type of Home: House Lives With: Significant other;Other (Comment) (lives with boyfriend and cares for 9-yr old adopted grandson) Receives Help From: Family (daughter) 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; per pt)  General  Date of Onset: 09/01/11 Type of Study: Bedside swallow evaluation Diet Prior to this Study: Regular;Thin liquids Temperature Spikes Noted: No Respiratory Status: Room air Trach Size and Type: With PMSV in place;Uncuffed;#6 History of Intubation: Yes Behavior/Cognition: Alert;Cooperative Oral Cavity - Dentition: Adequate natural dentition Vision: Functional for self-feeding Patient Positioning: Upright in bed Baseline Vocal Quality: Normal Volitional Cough: Strong Volitional Swallow: Able to elicit Ice chips: Not tested  Oral Motor/Sensory Function  Overall Oral Motor/Sensory Function: Appears within functional limits for tasks assessed  Consistency Results  Ice Chips Ice chips: Not tested  Thin Liquid Thin Liquid: Within functional limits Presentation: Cup  Nectar Thick Liquid Nectar Thick Liquid: Not tested  Honey Thick Liquid Honey Thick Liquid: Not tested  Puree Puree: Not tested  Solid Solid: Within functional limits Presentation: Self Fed   Isaias Dowson 09/01/2011,12:24 PM

## 2011-09-02 DIAGNOSIS — Z5189 Encounter for other specified aftercare: Secondary | ICD-10-CM

## 2011-09-02 DIAGNOSIS — J9600 Acute respiratory failure, unspecified whether with hypoxia or hypercapnia: Secondary | ICD-10-CM

## 2011-09-02 DIAGNOSIS — S52123A Displaced fracture of head of unspecified radius, initial encounter for closed fracture: Secondary | ICD-10-CM

## 2011-09-02 DIAGNOSIS — R5381 Other malaise: Secondary | ICD-10-CM

## 2011-09-02 LAB — DIFFERENTIAL
Basophils Relative: 0 % (ref 0–1)
Eosinophils Absolute: 0.3 10*3/uL (ref 0.0–0.7)
Monocytes Absolute: 0.4 10*3/uL (ref 0.1–1.0)
Monocytes Relative: 6 % (ref 3–12)
Neutro Abs: 3 10*3/uL (ref 1.7–7.7)

## 2011-09-02 LAB — CBC
HCT: 33.9 % — ABNORMAL LOW (ref 36.0–46.0)
Hemoglobin: 10.4 g/dL — ABNORMAL LOW (ref 12.0–15.0)
MCH: 23.7 pg — ABNORMAL LOW (ref 26.0–34.0)
MCHC: 30.7 g/dL (ref 30.0–36.0)

## 2011-09-02 NOTE — Progress Notes (Signed)
Physical Therapy Session Note  Patient Details  Name: [name] MRN: [id number] Date of Birth: 1956-05-21  Today's Date: 09/02/2011 Time: 8295-6213 Time Calculation (min): 30 min  Precautions: Precautions Precautions: Fall Required Braces or Orthoses: Yes Other Brace/Splint: Bilat ASO during mobility Restrictions Weight Bearing Restrictions: Yes LUE Weight Bearing: Non weight bearing LLE Weight Bearing: Weight bearing as tolerated Other Position/Activity Restrictions: no weight bearing through left elbow  Short Term Goals: PT Short Term Goal 1: Pt will gait 25' with min A in controlled environment  PT Short Term Goal 2: Pt will perform functional transfers with min A PT Short Term Goal 3: Pt will negotiate 3 stairs with min A  General Chart Reviewed: Yes Vital Signs HR<119bpm and SpO2>95% on room air throughout entire session. Pain Pain Assessment Pain Assessment: 0-10 Pain Score:   6 Location: R shoulder/neck Action: Emotional support; RN made aware. Mobility Bed Mobility Bed Mobility: Yes Supine to Sit: 5: Supervision Supine to Sit Details: Verbal cues for technique Sitting - Scoot to Edge of Bed: 5: Supervision Sit to Supine - Right: 5: Supervision Sit to Supine - Right Details: Verbal cues for technique Transfers Transfers: Yes Sit to Stand: 3: Mod assist Sit to Stand Details: Verbal cues for technique Stand to Sit: 4: Min assist  Gait ~21ft min A and HHA focusing on tall posture and gluteal activation; verbal cues for gluteal activation and tall posture.  Gait step through pattern, shortened steps, flexed trunk, bilat trunk lean and bilat LE circumduction. Pt's fatigue exacerbates these impairments.  Pt with increased fatigue this session.  Sit<>stand 10x R UE support off bed min A for bilat LE strength; without verbal cues pt had better weight shifts during these repetitions.  Kinetron 2 x 1' @ 45cm/s for bilat LE strength.  At end of session, w/c  mobility ~147ft back to bed supervision bilat LE propulsion.   Therapy/Group: Individual Therapy  Christianne Dolin 09/02/2011, 3:41 PM

## 2011-09-02 NOTE — Progress Notes (Signed)
Pt alert and oriented  Pt pain is controlled with current regimen. Pt transfers with assist of two to the bedside commode,  Pt continentn of urine .

## 2011-09-02 NOTE — Progress Notes (Signed)
Patient ID: [name], female   DOB: 1956-03-27, 55 y.o.   MRN: [id number] Subjective/Complaints: Review of Systems  Musculoskeletal: Positive for joint pain.  All other systems reviewed and are negative.   Slept fairly well, denies dizziness or SOB  Objective: Vital Signs: Blood pressure 141/78, pulse 126, temperature 98.9 F (37.2 C), temperature source Oral, resp. rate 18, SpO2 96.00%. No results found. No results found for this basename: WBC:2,HGB:2,HCT:2,PLT:2 in the last 72 hours No results found for this basename: NA:2,K:2,CL:2,CO2:2,GLUCOSE:2,BUN:2,CREATININE:2,CALCIUM:2 in the last 72 hours CBG (last 3)  No results found for this basename: GLUCAP:3 in the last 72 hours  Wt Readings from Last 3 Encounters:  08/30/11 84.1 kg (185 lb 6.5 oz)  08/30/11 84.1 kg (185 lb 6.5 oz)  08/05/11 93.8 kg (206 lb 12.7 oz)    Physical Exam:  General appearance: alert, cooperative and no distress Head: Normocephalic, without obvious abnormality, atraumatic Eyes: conjunctivae/corneas clear. PERRL, EOM's intact. Fundi benign. Ears: normal TM's and external ear canals both ears Nose: Nares normal. Septum midline. Mucosa normal. No drainage or sinus tenderness. Throat: lips, mucosa, and tongue normal; teeth and gums normal and trach site clean. #6 trach with PMV Neck: no adenopathy, no carotid bruit, no JVD, supple, symmetrical, trachea midline and thyroid not enlarged, symmetric, no tenderness/mass/nodules Back: symmetric, no curvature. ROM normal. No CVA tenderness. Resp: clear to auscultation bilaterally Cardio: regular rate and rhythm, S1, S2 normal, no murmur, click, rub or gallop GI: soft, non-tender; bowel sounds normal; no masses,  no organomegaly Extremities: extremities normal, atraumatic, no cyanosis or edema Pulses: 2+ and symmetric Skin: Skin color, texture, turgor normal. No rashes or lesions Neurologic: Patient with generalized weakness in all 4 limbs prox greater  than distal.  Patient limited due to pain at left elbow and shoulder. Musc: -50 degrees ext at left elbow. Incision/Wound: wounds clean and intact   Assessment/Plan: 1. Functional deficits secondary to acute respirator failure and recent left radius and fibular fx's  which require 3+ hours per day of interdisciplinary therapy in a comprehensive inpatient rehab setting. Physiatrist is providing close team supervision and 24 hour management of active medical problems listed below. Physiatrist and rehab team continue to assess barriers to discharge/monitor patient progress toward functional and medical goals. Mobility: Bed Mobility Bed Mobility: Yes Supine to Sit: 3: Mod assist Sitting - Scoot to Edge of Bed: 5: Supervision Sit to Supine - Right: 5: Supervision Transfers Transfers: Yes Sit to Stand: 3: Mod assist Stand to Sit: 4: Min assist Stand Pivot Transfers: 3: Mod assist Ambulation/Gait Ambulation/Gait Assistance: 3: Mod assist Ambulation/Gait Assistance Details (indicate cue type and reason): verbal cues for tall posture and pursed lip breathing Ambulation Distance (Feet): 8 Feet Assistive device: 1 person hand held assist Gait Pattern: Step-through pattern;Decreased step length - left;Decreased step length - right;Right circumduction;Left circumduction;Shuffle;Lateral trunk lean to right;Lateral trunk lean to left;Trunk flexed Stairs: Yes Stairs Assistance: 3: Mod assist Stair Management Technique: One rail Right;Forwards Number of Stairs: 3  Height of Stairs: 4  Wheelchair Mobility Wheelchair Mobility: Yes Wheelchair Assistance: 5: Supervision Wheelchair Propulsion: Right upper extremity;Both lower extermities Wheelchair Parts Management: Needs assistance (needs help for parts management) Distance: >169ft ADL:   Cognition: Cognition Overall Cognitive Status: Impaired Arousal/Alertness: Awake/alert Orientation Level: Oriented X4 Attention: Selective Sustained  Attention: Appears intact Selective Attention: Appears intact Memory: Impaired Memory Impairment: Decreased short term memory Decreased Short Term Memory: Functional basic (Pt did not utilize swallowing precautions of no straw) Awareness: Impaired Awareness Impairment: Emergent  impairment Problem Solving: Appears intact Problem Solving Impairment: Verbal basic;Functional basic Executive Function: Decision Making Organizing: Appears intact Organizing Impairment: Verbal basic;Functional basic Decision Making: Impaired Decision Making Impairment: Functional basic Behaviors: Restless;Poor frustration tolerance Safety/Judgment: Appears intact Cognition Arousal/Alertness: Awake/alert Orientation Level: Oriented X4  1. Deconditioning after respiratory failure-Follow critical care medicine as needed, has asymptomatic tachycardia, last EKG 12/3 was NSR, last hgb was 9.5 on 12/15 will recheck CBC   2. 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 Speech therapy followup with PMV valve. Current plan is to continue tracheostomy tube until followup in the outpatient setting with Dr. Jenne Pane of ENT. Check oxygen saturations every shift and applied 2 L oxygen for saturations less than 90%. I really think that we can downsize to #4 as she is comfortable and has few secretions.  Will discuss with ENT first.  3. DVT Prophylaxis/Anticoagulation: SCDs and support hose. Monitor for deep vein thrombosis. Recent upper and lower Dopplers negative   4. Pain Management: Currently using Tylenol only and will monitor with increased activity. Pain is an issue at the left elbow   5. left radial head fracture-S/P open reduction internal fixation November 24. Nonweightbearing at the left elbow. Removable splint for comfort. Patient may do active range of motion of elbow wrist and forearm with no passive ROM. Elbow remains quite limited with a flexion contracture. I'm unsure that she will ever have full extension  in this arm again unfortunately.   6. Left distal fibula fracture-weightbearing as tolerated per Dr. Melvyn Novas. Pain not a huge issue at the ankle.    7. Hypertension-Lasix daily. Monitor with increased activity   8. Anxiety/depression-continue Cymbalta 30 mg daily and Seroquel 100 mg twice daily. Provide emotional support and positive reinforcement. Trazodone was quite helpful last night for sleep.        LOS (Days) 2 A FACE TO FACE EVALUATION WAS PERFORMED  Kizzy Olafson E 09/02/2011, 8:49 AM

## 2011-09-02 NOTE — Progress Notes (Signed)
[facility]patient [facility] Individual Statement of Services  Patient Name:  [name]  Date:  09/02/2011  Welcome to the [facility]patient [facility].  Our goal is to provide you with an individualized program based on your diagnosis and situation, designed to meet your specific needs.  With this comprehensive rehabilitation program, you will be expected to participate in at least 3 hours of rehabilitation therapies Monday-Friday, with modified therapy programming on the weekends.  Your rehabilitation program will include the following services:  Physical Therapy (PT), Occupational Therapy (OT), Speech Therapy (ST), 24 hour per day rehabilitation nursing, Therapeutic Recreaction (TR), Case Management (RN and Child psychotherapist), Rehabilitation Medicine, Nutrition Services and Pharmacy Services  Weekly team conferences will be held on Tuesday  to discuss your progress.  Your RN Case Designer, television/film set will talk with you frequently to get your input and to update you on team discussions.  Team conferences with you and your family in attendance may also be held.  Depending on your progress and recovery, your program may change.  Your RN Case Estate agent will coordinate services and will keep you informed of any changes.  Your RN Sports coach and SW names and contact numbers are listed  below.  The following services may also be recommended but are not provided by the Inpatient [facility]:   Driving Evaluations  Home Health Rehabiltiation Services  Outpatient Rehabilitatation [facility]  Vocational Rehabilitation   Arrangements will be made to provide these services after discharge if needed.  Arrangements include referral to agencies that provide these services.  Your insurance has been verified to be:  [employer] Your primary doctor is:  Dr. Gershon Crane  Pertinent information will be shared with your doctor and your insurance  company.  ELOS:  10-14 days                           Goals: Supervision  Case Manager: Melanee Spry, [facility] [phone]  Social Worker:  [place], [place] [phone]  Information discussed with and copy given to patient by: [name], 09/02/2011, 5:20 PM

## 2011-09-02 NOTE — Progress Notes (Signed)
Occupational Therapy Note  Patient Details  Name: [name] MRN: [id number] Date of Birth: April 07, 1956 Today's Date: 09/02/2011 Time:  1100-1200  (60 mins. ) Pain:  6/10. Individual Therapy  Pt. Lying in bed upon OT arrival.  Pt had already bathed and dressed.  Addressed AROM to left elbow, forearm, and wrist.  OT provided stabilization to upper arm as pt. Moved.  Pt achieved approximately -45 degrees of elbow extension.  Pt. Stated she was suppose to get a splint for her left arm.  Called Dr. Orlan Leavens for clarification on note he wrote on Dec. 17.  He stated on phone that the splint was for comfort measure only..  OT discussed the options and pt chose not to have the splint fabricated at this time.      Humberto Seals 09/02/2011, 11:48 AM

## 2011-09-02 NOTE — Progress Notes (Signed)
Speech Language Pathology Therapy Note  Patient Details  Name: [name] MRN: [id number] Date of Birth: [birth year]  Today's Date: 09/02/2011 Time: 0930-1015 Time Calculation (min): 45 min  Precautions: Precautions Precautions: Fall Required Braces or Orthoses: Yes Other Brace/Splint: Bilat ASO during mobility Restrictions Weight Bearing Restrictions: Yes LUE Weight Bearing: Non weight bearing LLE Weight Bearing: Weight bearing as tolerated Other Position/Activity Restrictions: no weight bearing through left elbow   Skilled Therapeutic Interventions: Treatment focus on complex functional problem solving. Pt completed complex task with organizing/balancing a checkbook with Mod I. Supervision verbal cues to recall function of medications. Educated pt on importance of taking medications correctly and agreed to utilize a pill box with medications during therapy to trial for possible use at home. Min A to recall precautions for non weightbearing on ULE during a transfer to commode. Pt's SpO2 99% with adequate phonation for functional communication.   Pain: No/Denies Pain   Therapy/Group: Individual Therapy  Lyan Moyano 09/02/2011 11:24 AM

## 2011-09-02 NOTE — Progress Notes (Signed)
Physical Therapy Session Note  Patient Details  Name: [name] MRN: [id number] Date of Birth: 07-Oct-1955  Today's Date: 09/02/2011 Time: 0801-0859 Time Calculation (min): 58 min  Precautions: Precautions Precautions: Fall Required Braces or Orthoses: Yes Other Brace/Splint: Bilat ASO during mobility Restrictions Weight Bearing Restrictions: Yes LUE Weight Bearing: Non weight bearing LLE Weight Bearing: Weight bearing as tolerated Other Position/Activity Restrictions: no weight bearing through left elbow  Short Term Goals: PT Short Term Goal 1: Pt will gait 25' with min A in controlled environment  PT Short Term Goal 2: Pt will perform functional transfers with min A PT Short Term Goal 3: Pt will negotiate 3 stairs with min A  General Chart Reviewed: Yes Vital Signs HR <154bpm and SpO2 >94% on room air throughout entire session.  Pt c/o dizziness when standing; verbal cues for pursed lip breathing with good return demonstration.  Sx's resolve in <20sec. Pain Pain Assessment Pain Assessment: Yes Pain Score: 6 Pain Location: Bilateral shoulders, bilat arms, bilat knees, bilat ankles. Action: RN made aware, medication given, emotional support, frequent rest breaks PRN. Mobility Bed Mobility Bed Mobility: Yes Supine to Sit: 5: Supervision Sitting - Scoot to Edge of Bed: 5: Supervision Transfers Transfers: Yes Sit to Stand: 3: Mod assist Sit to Stand Details: Verbal cues for technique Stand to Sit: 4: Min assist  Gait ~11ft min A with HHA, focusing on standing tall and not leaning laterally or anteriorly; verbal cues to stand tall and squeeze gluteals.  Several standing rest breaks with verbal cues for pursed lip breathing when pt c/o dizziness.  Pt had good return demonstration and sx's resolved <20sec.  Gait step through pattern, shortened steps, flexed trunk, bilat trunk lean and bilat LE circumduction.  Pt's fatigue exacerbates these impairments.  Pt ambulating  farther each session, but fatigue continues to be limiting factor, requiring frequent rest breaks and verbal cues for pursed lip breathing.  Attempted sit<>stand, focusing on decreased anterior weight shift and chest up, but unable to perform this activity because of pain in pt's R knee.  Stairs mod A R rail RUE support ascend/Lrail RUE support descend x 2 trials; verbal cues to activate gluteals and stand tall.  1st trial, 6 steps 4".  2nd trial 4 steps 6".  Obstacle course stepping around and over objects with sidestepping min A to simulate obstacle avoidance in home avoidance to increase safety awareness and decrease risk of falls.  Kinetron 2 x 1' at 45cm/s to increase bilat LE strength.  Pt said, "it's easier on my knees than the steps."  Therapy/Group: Individual Therapy  Christianne Dolin 09/02/2011, 12:12 PM

## 2011-09-03 MED ORDER — PANTOPRAZOLE SODIUM 40 MG PO TBEC
40.0000 mg | DELAYED_RELEASE_TABLET | Freq: Two times a day (BID) | ORAL | Status: DC
  Administered 2011-09-03 – 2011-09-08 (×10): 40 mg via ORAL
  Filled 2011-09-03 (×9): qty 1

## 2011-09-03 NOTE — Progress Notes (Signed)
Physical Therapy Note  Patient Details  Name: [name] MRN: [id number] Date of Birth: [birth year] Today's Date: 09/03/2011 Time: 0715-0815 (60 min)  Precautions: Precautions  Precautions: Fall  Required Braces or Orthoses: Yes  Other Brace/Splint: Bilat ASO during mobility  Restrictions  Weight Bearing Restrictions: Yes  LUE Weight Bearing: Non weight bearing  LLE Weight Bearing: Weight bearing as tolerated  Other Position/Activity Restrictions: no weight bearing through left elbow  Short Term Goals:  PT Short Term Goal 1: Pt will gait 25' with min A in controlled environment  PT Short Term Goal 2: Pt will perform functional transfers with min A  PT Short Term Goal 3: Pt will negotiate 3 stairs with min A  General  Chart Reviewed: Yes  Vital Signs  HR<119bpm and SpO2>95% on room air throughout entire session.  Pain  Pain Assessment  Pain Assessment: 0-10  Pain Score: 3  Location: L shoulder/neck  Action: Emotional support; RN made aware.  Mobility  Bed Mobility  Bed Mobility: Yes  Supine to Sit: 5: Supervision  Supine to Sit Details: Verbal cues for technique  Sitting - Scoot to Edge of Bed: 5: Supervision  Sit to Supine - Right: 5: Supervision  Sit to Supine - Right Details: Verbal cues for technique  Transfers  Transfers: Yes  Sit to Stand: 3: Mod assist  Sit to Stand Details: Verbal cues for technique  Stand to Sit: 4: Min assist   Gait 120 ft min A and HHA focusing on tall posture and gluteal activation; verbal cues for gluteal activation and tall posture. Gait step through pattern, shortened steps, flexed trunk, bilat trunk lean and bilat LE circumduction. Pt's fatigue exacerbates these impairments. Therapeutic Activity with bed mobility min-A, transfers sit-stand with min-A. Therapeutic Exercises B LE's in supine and assisted with donning B knee sleeves and B ASO ankle braces prior to ambulation.  Individual Treatment Session  Jodelle Gross 09/03/2011, 7:43 AM

## 2011-09-03 NOTE — Progress Notes (Signed)
OccupationalTherapy Note  Patient Details  Name: Savita Runner MRN: [id number] Date of Birth: 1956/08/26 Today's Date: 09/03/2011  Time in/out: 11:30-12:15 Total minutes: 45  Pain: 7/10, left UE; RN aware  Skilled intervention: ADL-retraining w/c level at sink side with emphasis on pain management during ADL, compensatory strategies for impaired left UE function, and effective use of DME.  Patient was distracted by pain this session but able to sustain attention for 8-10 minute sessions, x3, to complete assisted upper body ADL and assisted toilet hygiene and clothing management.  Lower body ADL was limited to assisted peri-care and assisted bathing of buttocks only d/t time restriction.  Patient completed her tracheostomy care with only setup assist but required mod to max assist with ADL due to report of pain and weakness.   Patient states she has not practiced use of AE to extend her reach but is receptive to use of AE to improve independence in ADL.  Individual session   Georgeanne Nim 09/03/2011, 4:28 PM

## 2011-09-03 NOTE — Progress Notes (Addendum)
Patient ID: [name], female   DOB: 12-30-1955, 55 y.o.   MRN: [id number] Patient ID: [name], female   DOB: 07-23-1956, 55 y.o.   MRN: [id number] Subjective/Complaints: Review of Systems  Respiratory: Positive for cough and shortness of breath.   Cardiovascular: Negative for chest pain and palpitations.  Musculoskeletal: Positive for joint pain.  All other systems reviewed and are negative.   Slept fairly well, denies dizziness or SOB  Objective: Vital Signs: Blood pressure 131/81, pulse 90, temperature 98.8 F (37.1 C), temperature source Oral, resp. rate 18, SpO2 99.00%. No results found.  Basename 09/02/11 0930  WBC 7.2  HGB 10.4*  HCT 33.9*  PLT 276   No results found for this basename: NA:2,K:2,CL:2,CO2:2,GLUCOSE:2,BUN:2,CREATININE:2,CALCIUM:2 in the last 72 hours CBG (last 3)  No results found for this basename: GLUCAP:3 in the last 72 hours  Wt Readings from Last 3 Encounters:  08/30/11 185 lb 6.5 oz (84.1 kg)  08/30/11 185 lb 6.5 oz (84.1 kg)  08/05/11 206 lb 12.7 oz (93.8 kg)    Physical Exam:  General appearance: alert, cooperative and no distress Head: Normocephalic, without obvious abnormality, atraumatic Eyes: conjunctivae/corneas clear. PERRL, EOM's intact. Fundi benign. Ears: normal TM's and external ear canals both ears Nose: Nares normal. Septum midline. Mucosa normal. No drainage or sinus tenderness. Throat: lips, mucosa, and tongue normal; teeth and gums normal and trach site clean. #6 trach with PMV Neck: no adenopathy, no carotid bruit, no JVD, supple, symmetrical, trachea midline and thyroid not enlarged, symmetric, no tenderness/mass/nodules Back: symmetric, no curvature. ROM normal. No CVA tenderness. Resp: clear to auscultation bilaterally Cardio: regular rate and rhythm, S1, S2 normal, no murmur, click, rub or gallop GI: soft, non-tender; bowel sounds normal; no masses,  no organomegaly Extremities: extremities normal,  atraumatic, no cyanosis or edema Pulses: 2+ and symmetric Skin: Skin color, texture, turgor normal. No rashes or lesions Neurologic: Patient with generalized weakness in all 4 limbs prox greater than distal.  Patient limited due to pain at left elbow and shoulder. Musc: -50 degrees ext at left elbow. Incision/Wound: wounds clean and intact   Assessment/Plan: 1. Functional deficits secondary to acute respirator failure and recent left radius and fibular fx's  which require 3+ hours per day of interdisciplinary therapy in a comprehensive inpatient rehab setting. Physiatrist is providing close team supervision and 24 hour management of active medical problems listed below. Physiatrist and rehab team continue to assess barriers to discharge/monitor patient progress toward functional and medical goals. Mobility: Bed Mobility Bed Mobility: Yes Supine to Sit: 5: Supervision Sitting - Scoot to Edge of Bed: 5: Supervision Sit to Supine - Right: 5: Supervision Transfers Transfers: Yes Sit to Stand: 3: Mod assist Stand to Sit: 4: Min assist Stand Pivot Transfers: 3: Mod assist Ambulation/Gait Ambulation/Gait Assistance: 3: Mod assist Ambulation/Gait Assistance Details (indicate cue type and reason): verbal cues for tall posture and pursed lip breathing Ambulation Distance (Feet): 8 Feet Assistive device: 1 person hand held assist Gait Pattern: Step-through pattern;Decreased step length - left;Decreased step length - right;Right circumduction;Left circumduction;Shuffle;Lateral trunk lean to right;Lateral trunk lean to left;Trunk flexed Stairs: Yes Stairs Assistance: 3: Mod assist Stair Management Technique: One rail Right;Forwards Number of Stairs: 3  Height of Stairs: 4  Wheelchair Mobility Wheelchair Mobility: Yes Wheelchair Assistance: 5: Supervision Wheelchair Propulsion: Right upper extremity;Both lower extermities Wheelchair Parts Management: Needs assistance (needs help for parts  management) Distance: >175ft ADL:   Cognition: Cognition Overall Cognitive Status: Impaired Arousal/Alertness: Awake/alert Orientation Level: Oriented X4  Attention: Selective Sustained Attention: Appears intact Selective Attention: Appears intact Memory: Impaired Memory Impairment: Decreased short term memory Decreased Short Term Memory: Functional basic (Pt did not utilize swallowing precautions of no straw) Awareness: Impaired Awareness Impairment: Emergent impairment Problem Solving: Appears intact Problem Solving Impairment: Verbal basic;Functional basic Executive Function: Decision Making Organizing: Appears intact Organizing Impairment: Verbal basic;Functional basic Decision Making: Impaired Decision Making Impairment: Functional basic Behaviors: Restless;Poor frustration tolerance Safety/Judgment: Appears intact Cognition Arousal/Alertness: Awake/alert Orientation Level: Oriented X4  1. Deconditioning after respiratory failure-Follow critical care medicine as needed, has asymptomatic tachycardia, last EKG 12/3 was NSR, last hgb was 9.5 on 12/15 will recheck CBC   2. 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 Speech therapy followup with PMV valve. Current plan is to continue tracheostomy tube until followup in the outpatient setting with Dr. Jenne Pane of ENT. Check oxygen saturations every shift and applied 2 L oxygen for saturations less than 90%. I really think that we can downsize to #4 as she is comfortable and has few secretions.  Will discuss with ENT first.  3. DVT Prophylaxis/Anticoagulation: SCDs and support hose. Monitor for deep vein thrombosis. Recent upper and lower Dopplers negative   4. Pain Management: Currently using Tylenol only and will monitor with increased activity. Pain is an issue at the left elbow   5. left radial head fracture-S/P open reduction internal fixation November 24. Nonweightbearing at the left elbow. Removable splint for  comfort. Patient may do active range of motion of elbow wrist and forearm with no passive ROM. Elbow remains quite limited with a flexion contracture. I'm unsure that she will ever have full extension in this arm again unfortunately.   6. Left distal fibula fracture-weightbearing as tolerated per Dr. Melvyn Novas. Pain not a huge issue at the ankle.    7. Hypertension-Lasix daily. Monitor with increased activity   8. Anxiety/depression-continue Cymbalta 30 mg daily and Seroquel 100 mg twice daily. Provide emotional support and positive reinforcement. Trazodone was quite helpful last night for sleep.   9. Tachycardic episodes seem to be related to her deconditioning and other non-acute issues (ie mucus in trach tube etc). She is not having sx's of palpitations. Will watch. BP 131/81  Pulse 90  Temp(Src) 98.8 F (37.1 C) (Oral)  Resp 18  SpO2 99%        LOS (Days) 3 A FACE TO FACE EVALUATION WAS PERFORMED  Alex Plotnikov 09/03/2011, 9:12 AM

## 2011-09-03 NOTE — Progress Notes (Signed)
Pt alert/oriented x 3, up with 2+max assist to stand/pivot, #6 cuffless Shiley with Passey meure valve prn, trach care given today pt tolerated well, continuous pulse ox at 95% to 100%  On room air, buttocks with red, using barrier cream to site, microguard powder to bilateral  under breast, continent of bowel/bladder, last BM 20 th, will continue with plan of care

## 2011-09-03 NOTE — Progress Notes (Signed)
Progress Notes  Speech Language Pathology Therapy Note  Patient Details  Name: Shaily Librizzi MRN: [id number] Date of Birth: [birth year]  Today's Date: 09/03/2011 Time: 1030-1100 Time Calculation (min): 30 min  Precautions: Precautions Precautions: Fall Required Braces or Orthoses: Yes Other Brace/Splint: Bilat ASO during mobility Restrictions Weight Bearing Restrictions: Yes LUE Weight Bearing: Non weight bearing LLE Weight Bearing: Weight bearing as tolerated Other Position/Activity Restrictions: no weight bearing through left elbow  Skilled Therapeutic Interventions/Progress Updates: Session focused on cognitive skills with a complex task.  SLP facilitated session with medication management and loading pill box with minimal assist semantic cues to label function and frequency of given medication; supervision semantic cues faded to modified independence with self monitoring accuracy of loading pill box after set up assist; supervision semantic cues for safety with locking wheelchair brake on left prior to adjusting self in chair.  Pain Pain Assessment Pain Assessment: No/denies pain Pain Score: 0-No pain  Therapy/Group: Individual Therapy  Charlane Ferretti., CCC-SLP [phone] Kaleiah Kutzer 09/03/2011 12:26 PM

## 2011-09-03 NOTE — Progress Notes (Signed)
Physical Therapy Note  Patient Details  Name: [name] MRN: [id number] Date of Birth: 01-Jun-1956 Today's Date: 09/03/2011 Time: 1430-1530 (60') Pain: 3/10 L Elbow  Gait (15 minutes) 1106ft min A with HHA, focusing on standing tall and not leaning laterally or anteriorly; verbal cues to    stand tall and squeeze gluteals.  Gait step through pattern, shortened steps, flexed trunk, bilat trunk lean and bilat LE    circumduction.    Therapeutic Exercises; (15') seated and supine B LE ankle pumps, quad setting, gluteal setting, heel slides, hip      Abduction/adduction.  Therapeutic Activity (30') Transfer training supine<->sit<->stand with Supervised/Mod-I assist.  Assist to bathroom/toilet transfers and with clothing management at end of session.  Group Treatment Session  Jodelle Gross 09/03/2011, 3:10 PM

## 2011-09-04 MED ORDER — TRAZODONE HCL 50 MG PO TABS
50.0000 mg | ORAL_TABLET | Freq: Every day | ORAL | Status: DC
  Administered 2011-09-04 – 2011-09-07 (×4): 50 mg via ORAL
  Filled 2011-09-04 (×4): qty 1

## 2011-09-04 NOTE — Progress Notes (Signed)
Pt alert/oriented x 3, up with 2+ max assist WBAT to RLE, #6 Shiley with non disposable inner cannula, trach care given at 10 am and 4 pm today, on room air with sats 95-100%, continuous pulse ox on, buttocks with red using barrier cream, will continue with plan of care

## 2011-09-04 NOTE — Progress Notes (Addendum)
Occupational Therapy Note  Patient Details  Name: [name] MRN: [id number] Date of Birth: 1956/06/01 Today's Date: 09/04/2011 Pain;  4/10 left arm Time:  0800-0910   ( 70 minutes)  Individual Therapy Precautions:  LUE AROM only, no PROM Skilled OT intervention to address ADL retraining at shower level, functional mobility, LUE AROM, Standing balance.  Pt ambulated to shower.  Covered valve with plastic mask.  Pt tolerated shower with rest breaks as needed.  She used LUE as active assist throughout session  Humberto Seals 09/04/2011, 1:24 PM

## 2011-09-04 NOTE — Progress Notes (Signed)
Occupational Therapy Session Note  Patient Details  Name: [name] MRN: [id number] Date of Birth: 1956/03/10  Today's Date: 09/04/2011 Time: 1300-1330 Time Calculation (min):  Precautions: Precautions Precautions: Fall Required Braces or Orthoses: Yes Other Brace/Splint: Bilat ASO during mobility Restrictions Weight Bearing Restrictions: Yes LUE Weight Bearing: Non weight bearing LLE Weight Bearing: Weight bearing as tolerated Other Position/Activity Restrictions: no weight bearing through left elbow  Short Term Goals: OT Short Term Goal 1: Pt will follow HEP for left AAROM program with min a OT Short Term Goal 2: Pt will transfer to toilet stand pivot consistantly with min A OT Short Term Goal 3: Pt will perform toileting with min A consistantly OT Short Term Goal 4: Pt will perform tub shower transfer with tub bench with min A OT Short Term Goal 5: Pt will don LB clothing with min A  Skilled Therapeutic Interventions/Progress Updates:    Engaged in LUE AROM exercises.  Pt was able to achieve -30 elbow extension.  She tolerated the exercises with mild discomfort.  Addresses elbow flexion/extension, forearm sup/pronation (- 40 supination), and wrist flexion/extension.      Pain :  6/10; Pt declined pain pills   Therapy/Group: Individual Therapy  Humberto Seals 09/04/2011, 4:53 PM

## 2011-09-04 NOTE — Progress Notes (Signed)
Physical Therapy Note  Patient Details  Name: [name] MRN: [id number] Date of Birth: 1956-01-28 Today's Date: 09/04/2011  0945-1040 (55 minutes) group treatment Pain- no complaint of pain Vitals- Oxygen Saturation 96% RA (resting)/ pulse 119 BPM (resting) Pt. Participated in lower extremity group to improve strength and increase tolerance to activity. Pt performed the following: 1 bilateral heel slides, hip abduction, bridging, SAQs, ankle pumps X 20, sit to stand X 5 without UE assist Nustep (crosstrainer) LEs only X 5 minutes (Oxygen Sats > 92%RA/ pulse 120) Transfers- Stand pivot min to SBA   Arad Burston,JIM 09/04/2011, 12:42 PM

## 2011-09-04 NOTE — Progress Notes (Signed)
Physical Therapy Session Note  Patient Details  Name: [name] MRN: [id number] Date of Birth: 09-15-1955  Today's Date: 09/04/2011 Time: 8295-6213 Time Calculation (min): 45 min  Precautions: Precautions Precautions: Fall Required Braces or Orthoses: Yes Other Brace/Splint: Bilat ASO during mobility Restrictions Weight Bearing Restrictions: Yes LUE Weight Bearing: Non weight bearing LLE Weight Bearing: Weight bearing as tolerated Other Position/Activity Restrictions: no weight bearing through left elbow  Short Term Goals: PT Short Term Goal 1: Pt will gait 25' with min A in controlled environment  PT Short Term Goal 2: Pt will perform functional transfers with min A PT Short Term Goal 3: Pt will negotiate 3 stairs with min A  Skilled Therapeutic Interventions/Progress Updates: Gait training room>< gym, x 157' x 2, with min hand hold assist of R hand.  Sit > stand from lower surfaces of bed in ADL room, and pink armchair in pt's room resulted in pt attempting to use L hand to push off , and  required min A due to pt's pain/ unstable feeling of R knee joint; chronic PTA.    Gait with close S in controlled envirorment, on tile floor x 30', with increased L lateral displacement of trunk, decreased R stance time.  Gait on carpet resulted in 1 incidence of tripping, recovered with min hand hold assist.  Dynamic standing balance activities in ADL kitchen, reaching high and low for items with R hand, opening oven, etc, with pt problem solving I'ly for using R hand, and orienting her body in space. Pt attempted rocking heels><toes in standing, with RUE support on wall, but unable due to bil ankle pain.  Gait speed in 10 m timed test = 1.43'/sec, (with min hand hold assist) = decreased functional efficiency, and risk for recurrent falls.      Vital Signs Pulse Rate: 132  Oxygen Therapy SpO2: 98% O2 Device: None (Room air) Pain Pain Assessment Pain Score:   7 (R knee; chronic PTA; pt  declined meds until after tx)   Exercises: in sitting, strengthening bil LEs in non-painful range: 10 x 1 each short arc quad knee extensions, PF; pt attempted standing calf raises, but was unable due to bil ankle pain.        Therapy/Group: Individual Therapy  Ethelean Colla 09/04/2011, 3:47 PM

## 2011-09-04 NOTE — Progress Notes (Signed)
Patient ID: [name], female   DOB: 1956-01-29, 55 y.o.   MRN: [id number] Patient ID: [name], female   DOB: [birth year], 55 y.o.   MRN: [id number] Patient ID: [name], female   DOB: 05/31/1956, 55 y.o.   MRN: [id number] Subjective/Complaints: Review of Systems  Respiratory: Positive for cough and shortness of breath.   Cardiovascular: Negative for chest pain and palpitations.  Musculoskeletal: Positive for joint pain.  All other systems reviewed and are negative.   C/o insomnia w/o Trazodone and GERD on once a day Prilosec, denies dizziness or SOB  Objective: Vital Signs: Blood pressure 119/77, pulse 69, temperature 98.8 F (37.1 C), temperature source Oral, resp. rate 18, SpO2 98.00%. No results found.  Basename 09/02/11 0930  WBC 7.2  HGB 10.4*  HCT 33.9*  PLT 276   No results found for this basename: NA:2,K:2,CL:2,CO2:2,GLUCOSE:2,BUN:2,CREATININE:2,CALCIUM:2 in the last 72 hours CBG (last 3)  No results found for this basename: GLUCAP:3 in the last 72 hours  Wt Readings from Last 3 Encounters:  08/30/11 185 lb 6.5 oz (84.1 kg)  08/30/11 185 lb 6.5 oz (84.1 kg)  08/05/11 206 lb 12.7 oz (93.8 kg)    Physical Exam:  General appearance: alert, cooperative and no distress Head: Normocephalic, without obvious abnormality, atraumatic Eyes: conjunctivae/corneas clear. PERRL, EOM's intact. Fundi benign. Ears: normal TM's and external ear canals both ears Nose: Nares normal. Septum midline. Mucosa normal. No drainage or sinus tenderness. Throat: lips, mucosa, and tongue normal; teeth and gums normal and trach site clean. #6 trach with PMV Neck: no adenopathy, no carotid bruit, no JVD, supple, symmetrical, trachea midline and thyroid not enlarged, symmetric, no tenderness/mass/nodules Back: symmetric, no curvature. ROM normal. No CVA tenderness. Resp: clear to auscultation bilaterally Cardio: regular rate and rhythm, S1, S2 normal, no murmur, click, rub  or gallop GI: soft, non-tender; bowel sounds normal; no masses,  no organomegaly Extremities: extremities normal, atraumatic, no cyanosis or edema Pulses: 2+ and symmetric Skin: Skin color, texture, turgor normal. No rashes or lesions Neurologic: Patient with generalized weakness in all 4 limbs prox greater than distal.  Patient limited due to pain at left elbow and shoulder. Musc: -50 degrees ext at left elbow. Incision/Wound: wounds clean and intact   Assessment/Plan: 1. Functional deficits secondary to acute respirator failure and recent left radius and fibular fx's  which require 3+ hours per day of interdisciplinary therapy in a comprehensive inpatient rehab setting. Physiatrist is providing close team supervision and 24 hour management of active medical problems listed below. Physiatrist and rehab team continue to assess barriers to discharge/monitor patient progress toward functional and medical goals. Mobility: Bed Mobility Bed Mobility: Yes Supine to Sit: 5: Supervision Sitting - Scoot to Edge of Bed: 5: Supervision Sit to Supine - Right: 5: Supervision Transfers Transfers: Yes Sit to Stand: 3: Mod assist Stand to Sit: 4: Min assist Stand Pivot Transfers: 3: Mod assist Ambulation/Gait Ambulation/Gait Assistance: 3: Mod assist Ambulation/Gait Assistance Details (indicate cue type and reason): verbal cues for tall posture and pursed lip breathing Ambulation Distance (Feet): 8 Feet Assistive device: 1 person hand held assist Gait Pattern: Step-through pattern;Decreased step length - left;Decreased step length - right;Right circumduction;Left circumduction;Shuffle;Lateral trunk lean to right;Lateral trunk lean to left;Trunk flexed Stairs: Yes Stairs Assistance: 3: Mod assist Stair Management Technique: One rail Right;Forwards Number of Stairs: 3  Height of Stairs: 4  Wheelchair Mobility Wheelchair Mobility: Yes Wheelchair Assistance: 5: Supervision Wheelchair Propulsion:  Right upper extremity;Both lower extermities Wheelchair Parts Management: Needs  assistance (needs help for parts management) Distance: >115ft ADL:   Cognition: Cognition Overall Cognitive Status: Impaired Arousal/Alertness: Awake/alert Orientation Level: Oriented X4 Attention: Selective Sustained Attention: Appears intact Selective Attention: Appears intact Memory: Impaired Memory Impairment: Decreased short term memory Decreased Short Term Memory: Functional basic (Pt did not utilize swallowing precautions of no straw) Awareness: Impaired Awareness Impairment: Emergent impairment Problem Solving: Appears intact Problem Solving Impairment: Verbal basic;Functional basic Executive Function: Decision Making Organizing: Appears intact Organizing Impairment: Verbal basic;Functional basic Decision Making: Impaired Decision Making Impairment: Functional basic Behaviors: Restless;Poor frustration tolerance Safety/Judgment: Appears intact Cognition Arousal/Alertness: Awake/alert Orientation Level: Oriented X4  1. Deconditioning after respiratory failure-Follow critical care medicine as needed, has asymptomatic tachycardia, last EKG 12/3 was NSR, last hgb was 9.5 on 12/15 will recheck CBC   2. 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 Speech therapy followup with PMV valve. Current plan is to continue tracheostomy tube until followup in the outpatient setting with Dr. Jenne Pane of ENT. Check oxygen saturations every shift and applied 2 L oxygen for saturations less than 90%. I really think that we can downsize to #4 as she is comfortable and has few secretions.  Will discuss with ENT first.  3. DVT Prophylaxis/Anticoagulation: SCDs and support hose. Monitor for deep vein thrombosis. Recent upper and lower Dopplers negative   4. Pain Management: Currently using Tylenol only and will monitor with increased activity. Pain is an issue at the left elbow   5. left radial head  fracture-S/P open reduction internal fixation November 24. Nonweightbearing at the left elbow. Removable splint for comfort. Patient may do active range of motion of elbow wrist and forearm with no passive ROM. Elbow remains quite limited with a flexion contracture. I'm unsure that she will ever have full extension in this arm again unfortunately.   6. Left distal fibula fracture-weightbearing as tolerated per Dr. Melvyn Novas. Pain not a huge issue at the ankle.    7. Hypertension-Lasix daily. Monitor with increased activity   8. Anxiety/depression-continue Cymbalta 30 mg daily and Seroquel 100 mg twice daily. Provide emotional support and positive reinforcement. Trazodone was quite helpful last night for sleep.   9. Tachycardic episodes seem to be related to her deconditioning and other non-acute issues (ie mucus in trach tube etc). She is not having sx's of palpitations. Will watch. Improved -12/23 BP 119/77  Pulse 69  Temp(Src) 98.8 F (37.1 C) (Oral)  Resp 18  SpO2 98%  10. GERD - increase Protonix to bid  11. Insomnia - restarted Trazodone at 50 mg qhs        LOS (Days) 4 A FACE TO FACE EVALUATION WAS PERFORMED  Alex Plotnikov 09/04/2011, 8:56 AM

## 2011-09-05 NOTE — Progress Notes (Signed)
Occupational Therapy Session Note  Patient Details  Name: [name] MRN: [id number] Date of Birth: 1956-01-17  Today's Date: 09/05/2011 Time: 0900-0955 Time Calculation (min): 55 min  Precautions: Precautions Precautions: Fall Required Braces or Orthoses: Yes Other Brace/Splint: Bilat ASO during mobility Restrictions Weight Bearing Restrictions: Yes LUE Weight Bearing: Non weight bearing LLE Weight Bearing: Weight bearing as tolerated Other Position/Activity Restrictions: no weight bearing through left elbow  Short Term Goals: OT Short Term Goal 1: Pt will follow HEP for left AAROM program with min a OT Short Term Goal 2: Pt will transfer to toilet stand pivot consistantly with min A OT Short Term Goal 3: Pt will perform toileting with min A consistantly OT Short Term Goal 4: Pt will perform tub shower transfer with tub bench with min A OT Short Term Goal 5: Pt will don LB clothing with min A  Skilled Therapeutic Interventions/Progress Updates:  Self care retraining to include grooming at EOB (declined to perform at sink), ambulate to clean up after brush teeth and brush hair secondary to patient responding to encouragement to do as much for herself as safely possible.  Patient asked this clinician to perform tasks that patient is completely able to perform.  After discussion about patient attempting to perform tasks before asking others, she agreed that she needed to do more for herself with others present to be certain patient is safe with all activities.  Patient declined to walk to therapy apartment, agreed to sit in W/C and propel using her feet and required 1 rest break.  Patient close supervision for tub bench transfer (simulated).  Patient agreed that the bench she has at home will work fine.  Pain Pain Score:  5 Pain Type: Acute pain Pain Location: overall especially knees Pain Descriptors: Aching Pain Frequency: Intermittent Pain Onset: Gradual Pain  Intervention(s): premedicated   Therapy/Group: Individual Therapy  Skyler Dusing 09/05/2011, 12:08 PM

## 2011-09-05 NOTE — Progress Notes (Signed)
Physical Therapy Note  Patient Details  Name: [name] MRN: [id number] Date of Birth: 1956/03/06 Today's Date: 09/05/2011  TIme: 1300-1330 30 minutes  Pt c/o ankle pain with gait, RN aware, rests as needed.  Gait with close supervision 150', 165' with one standing rest break. Pt demos increased endurance this session.  Standing therex for LE strength and endurance hip abd, ext, HS curls, mini squats x 15 bilaterally.  Pt educated to exercise in pain free range to decrease knee pain during/after exercise.  Individual therapy   Geran Haithcock 09/05/2011, 3:13 PM

## 2011-09-05 NOTE — Progress Notes (Signed)
Patient up with 1 assist, non weight bearing on the right lower extremety, weight bearing as tolerated to left lower extremety. Complains of pain in left arm adminstered tylenol as needed and repositioned for comfort. Continent of bowel and bladder, calls appropriately for toileting. Trach number 6 shiley cuffless in place, O2 sat remains 95 or greater passy meir valve in place. Tolerating regular diet.

## 2011-09-05 NOTE — Progress Notes (Signed)
Physical Therapy Note  Patient Details  Name: [name] MRN: [id number] Date of Birth: [birth year] Today's Date: 09/05/2011  Time: 956-213 55 minutes  C/o L shoulder pain, Repositioned, RN aware.  Gait training short distances in household environment with close supervision, pt with improved balance reactions today.  Standing dynamic balance with toileting, dressing tasks with min A for steadying with reaching out of BOS.  Household and controlled environment gait training with close supervision-min A for LOB on carpet with obstacle negotiation, cues to slow down and concentrate on gait.  Multiple repetitions of sit to stand to increase LE strength and practice variety of surfaces for home.  Pt supervision with increased times and cues for technique.  Gait training with cane due to pt request to "feel steadier and help my knee"  Pt does not follow instructions for correct technique with cane but has no LOB in controlled environment with SPC. Pt states "I might use my cane at home if my knee hurts"  PT educated pt on correct technique, pt verbalizes but does not demo understanding  Individual therapy   DONAWERTH,KAREN 09/05/2011, 9:30 AM

## 2011-09-05 NOTE — Progress Notes (Signed)
Progress Notes  Speech Language Pathology Therapy Note  Patient Details  Name: [name] MRN: [id number] Date of Birth: 07/05/1956  Today's Date: 09/05/2011 Time: 1330-1410 Time Calculation (min): 40 min  Precautions: Precautions Precautions: Fall Required Braces or Orthoses: Yes Other Brace/Splint: Bilat ASO during mobility Restrictions Weight Bearing Restrictions: Yes LUE Weight Bearing: Non weight bearing LLE Weight Bearing: Weight bearing as tolerated Other Position/Activity Restrictions: no weight bearing through left elbow  Skilled Therapeutic Interventions/Progress Updates: Session (double) focused on discussion regarding anticipating accomodations needed to facilitate safety upon discharge to go home (medication box, 24/7 supervision, walker, moving slowly, etc.) with alternating attention to card game that targets thought organization and problem solving.  SLP facilitate session with supervision semantic sue faded to modified independence by end of session.     Pain Pain Assessment Pain Assessment: No/denies pain Pain Score: 0-No pain  Therapy/Group: Individual Therapy  Charlane Ferretti., CCC-SLP [phone] Tiny Chaudhary 09/05/2011 4:03 PM

## 2011-09-05 NOTE — Progress Notes (Signed)
Patient ID: [name], female   DOB: 08/22/1956, 55 y.o.   MRN: [id number] Patient ID: [name], female   DOB: 1956-05-11, 55 y.o.   MRN: [id number] Patient ID: [name], female   DOB: 17-Aug-1956, 55 y.o.   MRN: [id number] Patient ID: [name], female   DOB: 06/08/1956, 55 y.o.   MRN: [id number] Subjective/Complaints: Review of Systems  Respiratory: Positive for cough and shortness of breath.   Cardiovascular: Negative for chest pain and palpitations.  Musculoskeletal: Positive for joint pain.  All other systems reviewed and are negative.   C/o insomnia w/o Trazodone and GERD on once a day Prilosec, denies dizziness or SOB  Objective: Vital Signs: Blood pressure 148/52, pulse 114, temperature 98.5 F (36.9 C), temperature source Oral, resp. rate 18, SpO2 97.00%. No results found. No results found for this basename: WBC:2,HGB:2,HCT:2,PLT:2 in the last 72 hours No results found for this basename: NA:2,K:2,CL:2,CO2:2,GLUCOSE:2,BUN:2,CREATININE:2,CALCIUM:2 in the last 72 hours CBG (last 3)  No results found for this basename: GLUCAP:3 in the last 72 hours  Wt Readings from Last 3 Encounters:  08/30/11 84.1 kg (185 lb 6.5 oz)  08/30/11 84.1 kg (185 lb 6.5 oz)  08/05/11 93.8 kg (206 lb 12.7 oz)    Physical Exam:  General appearance: alert, cooperative and no distress Head: Normocephalic, without obvious abnormality, atraumatic Eyes: conjunctivae/corneas clear. PERRL, EOM's intact. Fundi benign. Ears: normal TM's and external ear canals both ears Nose: Nares normal. Septum midline. Mucosa normal. No drainage or sinus tenderness. Throat: lips, mucosa, and tongue normal; teeth and gums normal and trach site clean. #6 trach with PMV Neck: no adenopathy, no carotid bruit, no JVD, supple, symmetrical, trachea midline and thyroid not enlarged, symmetric, no tenderness/mass/nodules Back: symmetric, no curvature. ROM normal. No CVA tenderness. Resp: clear to  auscultation bilaterally speaking well around trach. Cardio: regular rate and rhythm, S1, S2 normal, no murmur, click, rub or gallop GI: soft, non-tender; bowel sounds normal; no masses,  no organomegaly Extremities: extremities normal, atraumatic, no cyanosis or edema Pulses: 2+ and symmetric Skin: Skin color, texture, turgor normal. No rashes or lesions Neurologic: Patient with generalized weakness 3+ to 4/5. Marland Kitchen  Patient limited due to pain at left elbow and shoulder still. Musc: -40 to -50 degrees ext at left elbow. Incision/Wound: wounds clean and intact.    Assessment/Plan: 1. Functional deficits secondary to acute respirator failure and recent left radius and fibular fx's  which require 3+ hours per day of interdisciplinary therapy in a comprehensive inpatient rehab setting. Physiatrist is providing close team supervision and 24 hour management of active medical problems listed below. Physiatrist and rehab team continue to assess barriers to discharge/monitor patient progress toward functional and medical goals. Mobility: Bed Mobility Bed Mobility: Yes Supine to Sit: 5: Supervision Sitting - Scoot to Edge of Bed: 5: Supervision Sit to Supine - Right: 5: Supervision Transfers Transfers: Yes Sit to Stand: 3: Mod assist Stand to Sit: 4: Min assist Stand Pivot Transfers: 3: Mod assist Ambulation/Gait Ambulation/Gait Assistance: 4: Min assist Ambulation/Gait Assistance Details (indicate cue type and reason): verbal cues for tall posture and pursed lip breathing Ambulation Distance (Feet): 157 Feet Assistive device: 1 person hand held assist Gait Pattern: Shuffle;Left circumduction;Right circumduction;Decreased hip/knee flexion - right;Decreased hip/knee flexion - left Gait velocity: 1.43'/sec, = household ambulator, risk for recurrent falls; decreased functional efficiciency Stairs: Yes Stairs Assistance: 3: Mod assist Stair Management Technique: One rail Right;Forwards Number of  Stairs: 3  Height of Stairs: 4  Wheelchair Mobility Wheelchair Mobility:  Yes Wheelchair Assistance: 5: Supervision Wheelchair Propulsion: Right upper extremity;Both lower extermities Wheelchair Parts Management: Needs assistance (needs help for parts management) Distance: >132ft ADL:   Cognition: Cognition Overall Cognitive Status: Impaired Arousal/Alertness: Awake/alert Orientation Level: Oriented X4 Attention: Selective Sustained Attention: Appears intact Selective Attention: Appears intact Memory: Impaired Memory Impairment: Decreased short term memory Decreased Short Term Memory: Functional basic (Pt did not utilize swallowing precautions of no straw) Awareness: Impaired Awareness Impairment: Emergent impairment Problem Solving: Appears intact Problem Solving Impairment: Verbal basic;Functional basic Executive Function: Decision Making Organizing: Appears intact Organizing Impairment: Verbal basic;Functional basic Decision Making: Impaired Decision Making Impairment: Functional basic Behaviors: Restless;Poor frustration tolerance Safety/Judgment: Appears intact Cognition Arousal/Alertness: Awake/alert Orientation Level: Oriented X4  1. Deconditioning after respiratory failure-Follow critical care medicine as needed, has asymptomatic tachycardia, last EKG 12/3 was NSR, last hgb was 9.5 on 12/15 will recheck CBC   2. 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 Speech therapy followup with PMV valve. Current plan is to continue tracheostomy tube until followup in the outpatient setting with Dr. Jenne Pane of ENT. Check oxygen saturations every shift and applied 2 L oxygen for saturations less than 90%. I really think that we can downsize to #4 as she is comfortable and has few secretions.  Will discuss with ENT first. D/c continuous O2 monitoring  3. DVT Prophylaxis/Anticoagulation: SCDs and support hose. Monitor for deep vein thrombosis. Recent upper and lower  Dopplers negative   4. Pain Management: Currently using Tylenol only and will monitor with increased activity. Pain is an issue at the left elbow but she's working through it.   5. left radial head fracture-S/P open reduction internal fixation November 24. Nonweightbearing at the left elbow. Removable splint for comfort. Patient may do active range of motion of elbow wrist and forearm with no passive ROM. Elbow still pretty tight.  6. Left distal fibula fracture-weightbearing as tolerated per Dr. Melvyn Novas. Pain not a huge issue at the ankle.    7. Hypertension-Lasix daily. Monitor with increased activity   8. Anxiety/depression-continue Cymbalta 30 mg daily and Seroquel 100 mg twice daily. Provide emotional support and positive reinforcement. Trazodone was quite helpful last night for sleep.   9. Tachycardic episodes seem to be related to her deconditioning and other non-acute issues (ie mucus in trach tube etc). She is not having sx's of palpitations. Will watch. Improved -12/23 BP 148/52  Pulse 114  Temp(Src) 98.5 F (36.9 C) (Oral)  Resp 18  SpO2 97%  10. GERD - increase Protonix to bid  11. Insomnia - restarted Trazodone at 50 mg qhs which has helped        LOS (Days) 5 A FACE TO FACE EVALUATION WAS PERFORMED  Tayte Mcwherter T 09/05/2011, 9:33 AM

## 2011-09-06 MED ORDER — TRAMADOL HCL 50 MG PO TABS
50.0000 mg | ORAL_TABLET | Freq: Three times a day (TID) | ORAL | Status: DC | PRN
  Administered 2011-09-06 – 2011-09-08 (×6): 50 mg via ORAL
  Filled 2011-09-06 (×6): qty 1

## 2011-09-06 NOTE — Progress Notes (Signed)
Supervise transfer bed to wc to toilet. Poor mobility to Left arm/NWB. Continent Bowel/bladder. BM today. Pt requires staff assist to cleanse. C/o pain to left arm and left knee. Responds to current pain medication regimen. Added Ultram to medications. Pt very needy at times. Many requests made of staff, patient encouraged to do some of the actions for self. Trach site intact. No signs of infection around stoma. Cleansed and suction performed. Continue plan of care.

## 2011-09-06 NOTE — Progress Notes (Signed)
Patient ID: [name], female   DOB: [birth year], 55 y.o.   MRN: [id number] Patient ID: [name], female   DOB: [birth year], 55 y.o.   MRN: [id number] Patient ID: [name], female   DOB: [birth year], 55 y.o.   MRN: [id number] Patient ID: [name], female   DOB: Sep 17, 1955, 55 y.o.   MRN: [id number] Patient ID: [name], female   DOB: 04-Jun-1956, 55 y.o.   MRN: [id number] Subjective/Complaints: Review of Systems  Respiratory: Positive for cough and shortness of breath.   Cardiovascular: Negative for chest pain and palpitations.  Musculoskeletal: Positive for joint pain.  All other systems reviewed and are negative.     Objective: Vital Signs: Blood pressure 125/76, pulse 96, temperature 98.2 F (36.8 C), temperature source Oral, resp. rate 18, SpO2 99.00%. No results found. No results found for this basename: WBC:2,HGB:2,HCT:2,PLT:2 in the last 72 hours No results found for this basename: NA:2,K:2,CL:2,CO2:2,GLUCOSE:2,BUN:2,CREATININE:2,CALCIUM:2 in the last 72 hours CBG (last 3)  No results found for this basename: GLUCAP:3 in the last 72 hours  Wt Readings from Last 3 Encounters:  08/30/11 84.1 kg (185 lb 6.5 oz)  08/30/11 84.1 kg (185 lb 6.5 oz)  08/05/11 93.8 kg (206 lb 12.7 oz)    Physical Exam:  General appearance: alert, cooperative and no distress Head: Normocephalic, without obvious abnormality, atraumatic Eyes: conjunctivae/corneas clear. PERRL, EOM's intact. Fundi benign. Ears: normal TM's and external ear canals both ears Nose: Nares normal. Septum midline. Mucosa normal. No drainage or sinus tenderness. Throat: lips, mucosa, and tongue normal; teeth and gums normal and trach site clean. #6 trach with PMV Neck: no adenopathy, no carotid bruit, no JVD, supple, symmetrical, trachea midline and thyroid not enlarged, symmetric, no tenderness/mass/nodules Back: symmetric, no curvature. ROM normal. No CVA tenderness. Resp: clear to  auscultation bilaterally speaking well around trach. Comfortable breathing. Cardio: regular rate and rhythm, S1, S2 normal, no murmur, click, rub or gallop GI: soft, non-tender; bowel sounds normal; no masses,  no organomegaly Extremities: extremities normal, atraumatic, no cyanosis or edema Pulses: 2+ and symmetric Skin: Skin color, texture, turgor normal. No rashes or lesions Neurologic: Patient with generalized weakness 3+ to 4/5. Marland Kitchen  Patient limited due to pain at left elbow and shoulder still. Musc: -40 to -50 degrees ext at left elbow. Incision/Wound: wounds clean and intact.    Assessment/Plan: 1. Functional deficits secondary to acute respirator failure and recent left radius and fibular fx's  which require 3+ hours per day of interdisciplinary therapy in a comprehensive inpatient rehab setting. Physiatrist is providing close team supervision and 24 hour management of active medical problems listed below. Physiatrist and rehab team continue to assess barriers to discharge/monitor patient progress toward functional and medical goals. Mobility: Bed Mobility Bed Mobility: Yes Supine to Sit: 5: Supervision Sitting - Scoot to Edge of Bed: 5: Supervision Sit to Supine - Right: 5: Supervision Transfers Transfers: Yes Sit to Stand: 3: Mod assist Stand to Sit: 4: Min assist Stand Pivot Transfers: 3: Mod assist Ambulation/Gait Ambulation/Gait Assistance: 4: Min assist Ambulation/Gait Assistance Details (indicate cue type and reason): verbal cues for tall posture and pursed lip breathing Ambulation Distance (Feet): 157 Feet Assistive device: 1 person hand held assist Gait Pattern: Shuffle;Left circumduction;Right circumduction;Decreased hip/knee flexion - right;Decreased hip/knee flexion - left Gait velocity: 1.43'/sec, = household ambulator, risk for recurrent falls; decreased functional efficiciency Stairs: Yes Stairs Assistance: 3: Mod assist Stair Management Technique: One rail  Right;Forwards Number of Stairs: 3  Height of Stairs: 4  Wheelchair  Mobility Wheelchair Mobility: Yes Wheelchair Assistance: 5: Supervision Wheelchair Propulsion: Right upper extremity;Both lower extermities Wheelchair Parts Management: Needs assistance (needs help for parts management) Distance: >182ft ADL:   Cognition: Cognition Overall Cognitive Status: Impaired Arousal/Alertness: Awake/alert Orientation Level: Oriented X4 Attention: Selective Sustained Attention: Appears intact Selective Attention: Appears intact Memory: Impaired Memory Impairment: Decreased short term memory Decreased Short Term Memory: Functional basic (Pt did not utilize swallowing precautions of no straw) Awareness: Impaired Awareness Impairment: Emergent impairment Problem Solving: Appears intact Problem Solving Impairment: Verbal basic;Functional basic Executive Function: Decision Making Organizing: Appears intact Organizing Impairment: Verbal basic;Functional basic Decision Making: Impaired Decision Making Impairment: Functional basic Behaviors: Restless;Poor frustration tolerance Safety/Judgment: Appears intact Cognition Arousal/Alertness: Awake/alert Orientation Level: Oriented X4  1. Deconditioning after respiratory failure-Follow critical care medicine as needed, has asymptomatic tachycardia, last EKG 12/3 was NSR, last hgb was 9.5 on 12/15 will recheck CBC   2. 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 Speech therapy followup with PMV valve. Current plan is to continue tracheostomy tube until followup in the outpatient setting with Dr. Jenne Pane of ENT. Check oxygen saturations every shift and applied 2 L oxygen for saturations less than 90%. I really think that we can downsize to #4 as she is comfortable and has few secretions.  Will discuss with ENT first. Off continuous monitoring.  3. DVT Prophylaxis/Anticoagulation: SCDs and support hose. Monitor for deep vein thrombosis. Recent  upper and lower Dopplers negative   4. Pain Management: Currently using Tylenol only and will monitor with increased activity. Pain is an issue at the left elbow but she's working through it.   5. left radial head fracture-S/P open reduction internal fixation November 24. Nonweightbearing at the left elbow. Removable splint for comfort. Patient may do active range of motion of elbow wrist and forearm with no passive ROM. Elbow still pretty tight.  6. Left distal fibula fracture-weightbearing as tolerated per Dr. Melvyn Novas. Pain not a huge issue at the ankle.    7. Hypertension-Lasix daily. Monitor with increased activity   8. Anxiety/depression-continue Cymbalta 30 mg daily and Seroquel 100 mg twice daily. Trazodone for sleep.  Mood pretty well controlled at present.   9. Tachycardic episodes seem to be related to her deconditioning and other non-acute issues (ie mucus in trach tube etc). She is not having sx's of palpitations. Will watch. Improved -12/23 BP 125/76  Pulse 96  Temp(Src) 98.2 F (36.8 C) (Oral)  Resp 18  SpO2 99%  10. GERD - increase Protonix to bid  11. Insomnia - restarted Trazodone at 50 mg qhs which has helped        LOS (Days) 6 A FACE TO FACE EVALUATION WAS PERFORMED  Jeffrey Voth T 09/06/2011, 5:37 AM

## 2011-09-07 MED ORDER — DICLOFENAC SODIUM 1 % TD GEL
Freq: Three times a day (TID) | TRANSDERMAL | Status: DC
  Administered 2011-09-07 (×3): via TOPICAL
  Filled 2011-09-07: qty 100

## 2011-09-07 NOTE — Progress Notes (Signed)
Speech Language Pathology Discharge Summary   Long term goals set: 4  Long term goals met: 4  Comments on progress toward goals: Pt has made great progress and has met all LTG's this admission. Currently, pt is Mod I for utilization and care of PMSV and is Mod I for utilization of swallowing strategies and is consuming a regular diet with thin liquids via straw without overt s/s of aspiration. Pt is also Mod I for complex problem solving, working memory, divided attention, and anticipatory awareness. Pt does not need skilled SLP f/u at this time. Pt will d/c home with family.   Reasons goals not met: N/A  Equipment acquired: N/A  Reasons for discharge: treatment goals met  Follow-up: N/A  Patient/family agrees with progress made and goals achieved: Yes  [name] 09/07/2011

## 2011-09-07 NOTE — Progress Notes (Signed)
Supervise transfer bed to wc to toilet. Poor mobility to Left arm/NWB. Continent Bowel/bladder. BM today. C/o pain to left arm and left knee. Responds to current pain medication regimen. Added Volteran to medications. Pt not making as many staff requests as previous day. Trach site intact. No signs of infection around stoma. Trach care performed. Continue plan of care.

## 2011-09-07 NOTE — Progress Notes (Signed)
Physical Therapy Session Note  Patient Details  Name: [name] MRN: [id number] Date of Birth: 01-Feb-1956  Today's Date: 09/07/2011 Time: 0803-0901 Time Calculation (min): 58 min  Precautions: Precautions Precautions: Fall Required Braces or Orthoses: Yes Other Brace/Splint: bilat ASO, bilat knee neoprene braces, right wrist brace Restrictions Weight Bearing Restrictions: Yes LUE Weight Bearing: Non weight bearing RLE Weight Bearing: Weight bearing as tolerated LLE Weight Bearing: Weight bearing as tolerated Other Position/Activity Restrictions: No weight bearing through left elbow  Short Term Goals: PT Short Term Goal 1: Pt will gait 25' with min A in controlled environment- met  PT Short Term Goal 2: Pt will perform functional transfers with min A- met PT Short Term Goal 3: Pt will negotiate 3 stairs with min A- progressing toward goal  Skilled Therapeutic Interventions/Progress Updates:     General Chart Reviewed: Yes Family/Caregiver Present: No  Pain Pain Assessment Pain Assessment: 0-10 Pain Score:   6 Faces Pain Scale: Hurts a little bit Pain Type: Acute pain Pain Location: Arm Pain Orientation: Left Pain Descriptors: Aching Pain Onset: On-going Patients Stated Pain Goal: 0 Pain Intervention(s): RN made aware;Emotional support Multiple Pain Sites: Yes 2nd Pain Site Pain Score: 6 Pain Type: Chronic pain Pain Location: Knee Pain Descriptors: Aching (RN made aware, rest breaks prn, donned orthotics)  Other Treatments  Patient with 6/10 left UE and bilat knee aching pain this morning. RN made aware. Donned bilat knee neoprene braces, bilat ASO, and right wrist orthosis for increased comfort, education throughout on positioning of orthotics and sequencing of donning and doffing them. Patient verbalized understanding and returned demonstration with mod assist of PT. Dynamic gait training in home environment with min assist, no AD, focus on obstacle  negotiation and tight turns. Toilet transfer and toileting min assist for steadying, focus on safety and problem solving. Dynamic gait training in open environment, no AD, min assist with obstacle and threshold negotiation, sidestepping, carpet, and head turns, focus on balance reactions and reaction time > 150 feet x 2. Patient with increased bilat knee pain following gait. Returned to room and positioned for comfort.  Therapy/Group: Individual Therapy  Romeo Rabon 09/07/2011, 9:14 AM

## 2011-09-07 NOTE — Progress Notes (Signed)
Social Work Patient ID: [name], female   DOB: [birth year], 55 y.o.   MRN: [id number]  Team feels pt ready for discharge tomorrow after therapies.  Spoke with pt and Pam -PA to inform. Discussed with pt need of wheelchair and OP therapies. She is agreeable and wants wheelchair that fits Her better.  Referral made to [facility]-wheelchair and [facility]-OPPT,OPOT. Appt for 1/4 9:15-11:15.  Pt reports she is ready for discharge and MD needs to medically clear her.

## 2011-09-07 NOTE — Progress Notes (Signed)
Occupational Therapy Note  Patient Details  Name: [name] MRN: [id number] Date of Birth: April 13, 1956 Today's Date: 09/07/2011  Session 1: 07-1154 ( ) No c/o pain 1:1 Pt declines showering and dressing today due to already dressed and donned braces with PT earlier.  Planned for ADL tomorrow at shower level down in ADL apartment, focus on d/c planning, toileting, toilet transfers, functional ambulation without an AD, active movement in left UE with exercises activity performed by pt. Issued a trach cover for showering at home to protect trach with PMSV on.  Session 2: 13:30-1400(80min) No c/o pain 1:1 continue to discuss d/c planning, focus on functional ambulation with and without 4 pronged cane around room and bathroom, mod I with toileting with wipe, sit to stand, preparing w/c to stand, manvering around obstacles in room demonstrating safe mobility, using left hand as an active assist to nondominant, gathering all items needed for shower tomorrow down in the ADL apartment   [name] 09/07/2011, 1:56 PM

## 2011-09-07 NOTE — Progress Notes (Signed)
Speech Language Pathology Therapy Note  Patient Details  Name: [name] MRN: [id number] Date of Birth: [birth year]  Today's Date: 09/07/2011 Time: 0900-0945 Time Calculation (min): 45 min  Precautions: Precautions Precautions: Fall Required Braces or Orthoses: Yes Other Brace/Splint: bilat ASO, bilat knee neoprene braces, right wrist brace Restrictions Weight Bearing Restrictions: Yes LUE Weight Bearing: Non weight bearing RLE Weight Bearing: Weight bearing as tolerated LLE Weight Bearing: Weight bearing as tolerated Other Position/Activity Restrictions: No weight bearing through left elbow  Skilled Therapeutic Interventions: Treatment focus on utilization of pill box for complex medication management. Pt overall Mod I for problem solving and organization. Pt reports decreased working memory but is Mod I for use of utilization of compensatory strategies (writing information down). Mod I for use of PMSV. Observed with thin liquids via straw without overt s/s of aspiration.   Precautions/Restrictions  Precautions Precautions: Fall Required Braces or Orthoses: Yes Other Brace/Splint: bilat ASO, bilat knee neoprene braces, right wrist brace Restrictions Weight Bearing Restrictions: Yes LUE Weight Bearing: Non weight bearing RLE Weight Bearing: Weight bearing as tolerated LLE Weight Bearing: Weight bearing as tolerated Other Position/Activity Restrictions: No weight bearing through left elbow General    Vital Signs Therapy Vitals Temp: 98.6 F (37 C) Temp src: Oral Pulse Rate: 93  Resp: 18  BP: 127/80 mmHg Patient Position, if appropriate: Lying Oxygen Therapy SpO2: 99 % O2 Device: Trach collar FiO2 (%): 28 % O2 Flow Rate (L/min): 5 L/min Pulse Oximetry Type: Intermittent Pain Pain Assessment Pain Assessment: 0-10 Pain Score:   6 Faces Pain Scale: Hurts a little bit Pain Type: Acute pain Pain Location: Arm Pain Orientation: Left Pain Descriptors:  Aching Pain Onset: On-going Patients Stated Pain Goal: 0 Pain Intervention(s): RN made aware;Emotional support Multiple Pain Sites: Yes 2nd Pain Site Pain Score: 6 Pain Type: Chronic pain Pain Location: Knee Pain Descriptors: Aching (RN made aware, rest breaks prn, donned orthotics)  Oral/Motor: Oral Motor/Sensory Function Overall Oral Motor/Sensory Function: Appears within functional limits for tasks assessed Motor Speech Overall Motor Speech: Appears within functional limits for tasks assessed Respiration: Within functional limits (SpO2:98% with PMSV on room air) Phonation: Normal (with PMSV) Resonance: Within functional limits Articulation: Within functional limitis Intelligibility: Intelligible Motor Planning: Witnin functional limits Motor Speech Errors: Not applicable Comprehension: Auditory Comprehension Overall Auditory Comprehension: Appears within functional limits for tasks assessed Yes/No Questions: Within Functional Limits Commands: Within Functional Limits Conversation: Simple Visual Recognition/Discrimination Discrimination: Within Function Limits Reading Comprehension Reading Status: Not tested Expression: Expression Primary Mode of Expression: Verbal Verbal Expression Overall Verbal Expression: Appears within functional limits for tasks assessed Initiation: No impairment Level of Generative/Spontaneous Verbalization: Conversation Repetition: No impairment Naming: No impairment Pragmatics: No impairment Other Verbal Expression Comments: Pt utilizing PMSV during all waking hours and able to produce adequate phonation for functional communication.  Written Expression Dominant Hand: Right Written Expression: Not tested  Therapy/Group: Individual Therapy  [name] 09/07/2011 9:50 AM

## 2011-09-07 NOTE — Progress Notes (Signed)
Subjective/Complaints: Review of Systems  Respiratory: Positive for cough and shortness of breath.   Cardiovascular: Negative for chest pain and palpitations.  Musculoskeletal: Positive for joint pain.  All other systems reviewed and are negative.  likes flow by humidified air at night Complains of hand and knee pain   Objective: Vital Signs: Blood pressure 116/73, pulse 99, temperature 98.1 F (36.7 C), temperature source Oral, resp. rate 18, SpO2 99.00%. No results found. No results found for this basename: WBC:2,HGB:2,HCT:2,PLT:2 in the last 72 hours No results found for this basename: NA:2,K:2,CL:2,CO2:2,GLUCOSE:2,BUN:2,CREATININE:2,CALCIUM:2 in the last 72 hours CBG (last 3)  No results found for this basename: GLUCAP:3 in the last 72 hours  Wt Readings from Last 3 Encounters:  08/30/11 84.1 kg (185 lb 6.5 oz)  08/30/11 84.1 kg (185 lb 6.5 oz)  08/05/11 93.8 kg (206 lb 12.7 oz)    Physical Exam:  General appearance: alert, cooperative and no distress Head: Normocephalic, without obvious abnormality, atraumatic Eyes: conjunctivae/corneas clear. PERRL, EOM's intact. Fundi benign. Ears: normal TM's and external ear canals both ears Nose: Nares normal. Septum midline. Mucosa normal. No drainage or sinus tenderness. Throat: lips, mucosa, and tongue normal; teeth and gums normal and trach site clean. #6 trach with PMV Neck: no adenopathy, no carotid bruit, no JVD, supple, symmetrical, trachea midline and thyroid not enlarged, symmetric, no tenderness/mass/nodules Back: symmetric, no curvature. ROM normal. No CVA tenderness. Resp: clear to auscultation bilaterally speaking well around trach. Comfortable breathing. Cardio: regular rate and rhythm, S1, S2 normal, no murmur, click, rub or gallop GI: soft, non-tender; bowel sounds normal; no masses,  no organomegaly Extremities: extremities normal, atraumatic, no cyanosis or edema Pulses: 2+ and symmetric Skin: Skin color, texture,  turgor normal. No rashes or lesions Neurologic: Patient with generalized weakness 3+ to 4/5. Marland Kitchen  Patient limited due to pain at left elbow and shoulder still. Musc: -40 to -50 degrees ext at left elbow. Incision/Wound: wounds clean and intact.    Assessment/Plan: 1. Functional deficits secondary to acute respirator failure and recent left radius and fibular fx's  which require 3+ hours per day of interdisciplinary therapy in a comprehensive inpatient rehab setting. Physiatrist is providing close team supervision and 24 hour management of active medical problems listed below. Physiatrist and rehab team continue to assess barriers to discharge/monitor patient progress toward functional and medical goals. Mobility: Bed Mobility Bed Mobility: Yes Supine to Sit: 5: Supervision Sitting - Scoot to Edge of Bed: 5: Supervision Sit to Supine - Right: 5: Supervision Transfers Transfers: Yes Sit to Stand: 3: Mod assist Stand to Sit: 4: Min assist Stand Pivot Transfers: 3: Mod assist Ambulation/Gait Ambulation/Gait Assistance: 4: Min assist Ambulation/Gait Assistance Details (indicate cue type and reason): verbal cues for tall posture and pursed lip breathing Ambulation Distance (Feet): 157 Feet Assistive device: 1 person hand held assist Gait Pattern: Shuffle;Left circumduction;Right circumduction;Decreased hip/knee flexion - right;Decreased hip/knee flexion - left Gait velocity: 1.43'/sec, = household ambulator, risk for recurrent falls; decreased functional efficiciency Stairs: Yes Stairs Assistance: 3: Mod assist Stair Management Technique: One rail Right;Forwards Number of Stairs: 3  Height of Stairs: 4  Wheelchair Mobility Wheelchair Mobility: Yes Wheelchair Assistance: 5: Supervision Wheelchair Propulsion: Right upper extremity;Both lower extermities Wheelchair Parts Management: Needs assistance (needs help for parts management) Distance: >161ft ADL:   Cognition: Cognition Overall  Cognitive Status: Impaired Arousal/Alertness: Awake/alert Orientation Level: Oriented X4 Attention: Selective Sustained Attention: Appears intact Selective Attention: Appears intact Memory: Impaired Memory Impairment: Decreased short term memory Decreased Short Term  Memory: Functional basic (Pt did not utilize swallowing precautions of no straw) Awareness: Impaired Awareness Impairment: Emergent impairment Problem Solving: Appears intact Problem Solving Impairment: Verbal basic;Functional basic Executive Function: Decision Making Organizing: Appears intact Organizing Impairment: Verbal basic;Functional basic Decision Making: Impaired Decision Making Impairment: Functional basic Behaviors: Restless;Poor frustration tolerance Safety/Judgment: Appears intact Cognition Arousal/Alertness: Awake/alert Orientation Level: Oriented X4  1. Deconditioning after respiratory failure-Follow critical care medicine as needed, has asymptomatic tachycardia, last EKG 12/3 was NSR, last hgb was 9.5 on 12/15 will recheck CBC   2. 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 Speech therapy followup with PMV valve. Current plan is to continue tracheostomy tube until followup in the outpatient setting with Dr. Jenne Pane of ENT. Check oxygen saturations every shift and applied 2 L oxygen for saturations less than 90%. I really think that we can downsize to #4 as she is comfortable and has few secretions.  Will discuss with ENT first. Off continuous monitoring currently.   3. DVT Prophylaxis/Anticoagulation: SCDs and support hose. Monitor for deep vein thrombosis. Recent upper and lower Dopplers negative   4. Pain Management: Currently using Tylenol only and will monitor with increased activity. Pain is an issue at the left elbow but she's working through it.  Discussed it with patient today and told her rehab will take months for the elbow  -add voltaren gel for hand/knees.   5. left radial head  fracture-S/P open reduction internal fixation November 24. Nonweightbearing at the left elbow. Removable splint for comfort. Patient may do active range of motion of elbow wrist and forearm with no passive ROM. Elbow still pretty tight.  6. Left distal fibula fracture-weightbearing as tolerated per Dr. Melvyn Novas. Pain not a huge issue at the ankle.    7. Hypertension-Lasix daily. Monitor with increased activity   8. Anxiety/depression-continue Cymbalta 30 mg daily and Seroquel 100 mg twice daily. Trazodone for sleep.  Mood pretty well controlled at present.   9. Tachycardic episodes seem to be related to her deconditioning and other non-acute issues (ie mucus in trach tube etc). HR gradully showing improvement. BP 116/73  Pulse 99  Temp(Src) 98.1 F (36.7 C) (Oral)  Resp 18  SpO2 99%  10. GERD - increase Protonix to bid  11. Insomnia - restarted Trazodone at 50 mg qhs which has helped        LOS (Days) 7 A FACE TO FACE EVALUATION WAS PERFORMED  Kayna Suppa T 09/07/2011, 6:53 AM

## 2011-09-08 MED ORDER — POTASSIUM CHLORIDE CRYS ER 20 MEQ PO TBCR: 40.0000 meq | EXTENDED_RELEASE_TABLET | Freq: Every day | ORAL | Status: DC

## 2011-09-08 MED ORDER — FUROSEMIDE 40 MG PO TABS: 40.0000 mg | ORAL_TABLET | Freq: Every day | ORAL | Status: DC

## 2011-09-08 NOTE — Patient Care Conference (Signed)
Inpatient RehabilitationTeam Conference Note Date: 09/08/2011   Time: 4:32 PM    Patient Name: [name]      Medical Record Number: [id number]  Date of Birth: 11-26-1955 Sex: Female         Room/Bed: 4031/4031-01 Payor Info: Payor: COVENTRY  Plan: MAILHANDLERS/COVENTRY  Product Type: *No Product type*     Admitting Diagnosis: Respiratory Failure  Admit Date/Time:  08/31/2011 12:32 PM Admission Comments: No comment available   Primary Diagnosis:  ARF (acute respiratory failure) Principal Problem: ARF (acute respiratory failure)  Patient Active Problem List  Diagnoses Date Noted  . ARF (acute respiratory failure) 08/31/2011  . Respiratory failure 08/30/2011  . Aspiration pneumonia 08/30/2011  . Hypoxia 08/30/2011  . 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 Physical deconditioning 08/30/2011  . Encephalopathy acute 08/30/2011  . Stridor 08/18/2011  . Radial head fracture, closed 08/06/2011    Class: Acute  . ACUTE SINUSITIS, UNSPECIFIED 06/23/2009  . ANXIETY 04/10/2009  . DEPRESSION 04/10/2009  . SHOULDER PAIN, CHRONIC 04/10/2009  . HIP PAIN, CHRONIC 04/10/2009  . NECK PAIN, CHRONIC 04/10/2009    Expected Discharge Date: Expected Discharge Date: 09/08/11  Team Members Present: Physician: Dr. Faith Rogue Case Manager Present: Melanee Spry, RN Social Worker Present: Amada Jupiter, LCSW PT Present: Reggy Eye, PT OT Present: Roney Mans, OT;Ardis Rowan, Corky Crafts, OT SLP Present: Fae Pippin, SLP RN Present: Carmie End    Current Status/Progress Goal Weekly Team Focus  Medical   tolerating trach well and using pmv.  pain controlled  increased cardiopulmonary tolerance  trach mgt   Bowel/Bladder   Continent of bowel and bladder. No constipation  Continent of bowel and bladder      Swallow/Nutrition/ Hydration   Regular and thin, Mod I  Mod I  D/c from SLP caseload   ADL's   mod I  mod I  d/c planning   Mobility   supervision - min A  mod I   activity tolerance, balance   Communication   Mod I with PMSV  Mod I  D/c from SLP caseload   Safety/Cognition/ Behavioral Observations  Mod I  Mod I  D/C from SLP caseload   Pain   Moderately controlled by current pain regimen  Maintain pt goal of <3/10       Skin   No signs of infection.   Maintain skin integrity         *See Interdisciplinary Assessment and Plan and progress notes for long and short-term goals  Barriers to Discharge: none    Possible Resolutions to Barriers:  n/a    Discharge Planning/Teaching Needs:  home with family and boyfriend to assist as needed - d/c today      Team Discussion: Completing confirmation of trach care education w/ Resp.Therapy.     Revisions to Treatment Plan: none    Continued Need for Acute Rehabilitation Level of Care: The patient requires daily medical management by a physician with specialized training in physical medicine and rehabilitation for the following conditions: Daily direction of a multidisciplinary physical rehabilitation program to ensure safe treatment while eliciting the highest outcome that is of practical value to the patient.: Yes Daily medical management of patient stability for increased activity during participation in an intensive rehabilitation regime.: Yes Daily analysis of laboratory values and/or radiology reports with any subsequent need for medication adjustment of medical intervention for : Post surgical problems;Neurological problems;Other  Pt pleased about d/c.  Has demonstrated to Resp. Therapy her ability to perform trach care.  Trach supplies, including home suction machine, have been ordered.  Pt is on room air & using PMV.  [name] 09/08/2011, 4:32 PM

## 2011-09-08 NOTE — Progress Notes (Signed)
Patient discharge to home at 1400 with family, shiley number 6 cuffless trach intact, respiratory care management of trach, respiratory therapy performed education and teaching of trach prior to discharge. Patient to be going home with home care and emergency trach supplies. Patient and family was present for all education and teaching.

## 2011-09-08 NOTE — Progress Notes (Signed)
Social Work  Discharge Note  The overall goal for the admission was met for:   Discharge location: Yes - home with boyfriend  Length of Stay: Yes - 8 days  Discharge activity level: Yes - mod independent  Home/community participation: Yes  Services provided included: MD, RD, PT, OT, SLP, RN, CM, TR, Pharmacy and SW  Financial Services: Private Insurance: [employer]  Follow-up services arranged: Outpatient: PT and OT at [facility], DME: trach supplies (with RT visit) via [facility] and Patient/Family has no preference for HH/DME agencies  Comments (or additional information):  Patient/Family verbalized understanding of follow-up arrangements: Yes  Individual responsible for coordination of the follow-up plan: patient  Confirmed correct DME delivered: [name] 09/08/2011    Tessla Spurling

## 2011-09-08 NOTE — Progress Notes (Signed)
Patient ID: [name], female   DOB: April 16, 1956, 55 y.o.   MRN: [id number] Subjective/Complaints: Review of Systems  Respiratory: Positive for cough and shortness of breath.   Cardiovascular: Negative for chest pain and palpitations.  Musculoskeletal: Positive for joint pain.  All other systems reviewed and are negative.  Excited about progress. Hopes to go home today.   Objective: Vital Signs: Blood pressure 116/91, pulse 93, temperature 98.7 F (37.1 C), temperature source Oral, resp. rate 18, weight 87.3 kg (192 lb 7.4 oz), SpO2 98.00%. No results found. No results found for this basename: WBC:2,HGB:2,HCT:2,PLT:2 in the last 72 hours No results found for this basename: NA:2,K:2,CL:2,CO2:2,GLUCOSE:2,BUN:2,CREATININE:2,CALCIUM:2 in the last 72 hours CBG (last 3)  No results found for this basename: GLUCAP:3 in the last 72 hours  Wt Readings from Last 3 Encounters:  09/07/11 87.3 kg (192 lb 7.4 oz)  08/30/11 84.1 kg (185 lb 6.5 oz)  08/30/11 84.1 kg (185 lb 6.5 oz)    Physical Exam:  General appearance: alert, cooperative and no distress Head: Normocephalic, without obvious abnormality, atraumatic Eyes: conjunctivae/corneas clear. PERRL, EOM's intact. Fundi benign. Ears: normal TM's and external ear canals both ears Nose: Nares normal. Septum midline. Mucosa normal. No drainage or sinus tenderness. Throat: lips, mucosa, and tongue normal; teeth and gums normal and trach site clean. #6 trach with PMV Neck: no adenopathy, no carotid bruit, no JVD, supple, symmetrical, trachea midline and thyroid not enlarged, symmetric, no tenderness/mass/nodules Back: symmetric, no curvature. ROM normal. No CVA tenderness. Resp: clear to auscultation bilaterally speaking well around trach. Comfortable breathing. Cardio: regular rate and rhythm, S1, S2 normal, no murmur, click, rub or gallop GI: soft, non-tender; bowel sounds normal; no masses,  no organomegaly Extremities: extremities  normal, atraumatic, no cyanosis or edema Pulses: 2+ and symmetric Skin: Skin color, texture, turgor normal. No rashes or lesions Neurologic: Patient with generalized weakness 3+ to 4/5. Marland Kitchen  Patient limited due to pain at left elbow and shoulder still. Musc: -30 degrees ext at left elbow.  Better activity tolerance at elbow Incision/Wound: wounds clean and intact.    Assessment/Plan: 1. Functional deficits secondary to acute respirator failure and recent left radius and fibular fx's  which require 3+ hours per day of interdisciplinary therapy in a comprehensive inpatient rehab setting. Physiatrist is providing close team supervision and 24 hour management of active medical problems listed below. Physiatrist and rehab team continue to assess barriers to discharge/monitor patient progress toward functional and  medical goals.  I'm ok with her leaving today.  Mobility: Bed Mobility Bed Mobility: Yes Supine to Sit: 5: Supervision Sitting - Scoot to Edge of Bed: 5: Supervision Sit to Supine - Right: 5: Supervision Transfers Transfers: Yes Sit to Stand: 3: Mod assist Stand to Sit: 4: Min assist Stand Pivot Transfers: 3: Mod assist Ambulation/Gait Ambulation/Gait Assistance: 4: Min assist Ambulation/Gait Assistance Details (indicate cue type and reason): verbal cues for tall posture and pursed lip breathing Ambulation Distance (Feet): 157 Feet Assistive device: 1 person hand held assist Gait Pattern: Shuffle;Left circumduction;Right circumduction;Decreased hip/knee flexion - right;Decreased hip/knee flexion - left Gait velocity: 1.43'/sec, = household ambulator, risk for recurrent falls; decreased functional efficiciency Stairs: Yes Stairs Assistance: 3: Mod assist Stair Management Technique: One rail Right;Forwards Number of Stairs: 3  Height of Stairs: 4  Wheelchair Mobility Wheelchair Mobility: Yes Wheelchair Assistance: 5: Supervision Wheelchair Propulsion: Right upper  extremity;Both lower extermities Wheelchair Parts Management: Needs assistance (needs help for parts management) Distance: >149ft ADL:   Cognition: Cognition Overall  Cognitive Status: Impaired Arousal/Alertness: Awake/alert Orientation Level: Oriented X4 Attention: Divided Sustained Attention: Appears intact Selective Attention: Appears intact Divided Attention: Appears intact Memory: Impaired Memory Impairment: Decreased short term memory (Mod I for strategies) Decreased Short Term Memory: Verbal complex;Functional complex Awareness: Appears intact Awareness Impairment: Emergent impairment Problem Solving: Appears intact Problem Solving Impairment: Verbal basic;Functional basic Executive Function: Decision Making Organizing: Appears intact Organizing Impairment: Verbal basic;Functional basic Decision Making: Appears intact Decision Making Impairment: Functional basic Behaviors: Restless;Poor frustration tolerance Safety/Judgment: Appears intact Cognition Arousal/Alertness: Awake/alert Orientation Level: Oriented X4  1. Deconditioning after respiratory failure-Follow critical care medicine as needed, has asymptomatic tachycardia, last EKG 12/3 was NSR, last hgb was 9.5 on 12/15 will recheck CBC   2. 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 Speech therapy followup with PMV valve. Current plan is to continue tracheostomy tube until followup in the outpatient setting with Dr. Jenne Pane of ENT. Check oxygen saturations every shift and applied 2 L oxygen for saturations less than 90%. At this point, she can go home and f/u with ENT for trach mgt. Need to teach trach care before d/c  3. DVT Prophylaxis/Anticoagulation: SCDs and support hose. Monitor for deep vein thrombosis. Recent upper and lower Dopplers negative   4. Pain Management: Currently using Tylenol only and will monitor with increased activity. Pain is an issue at the left elbow but she's working through it.   Discussed it with patient today and told her rehab will take months for the elbow  -wont' send home on voltaren gel.   5. left radial head fracture-S/P open reduction internal fixation November 24. Nonweightbearing at the left elbow. Removable splint for comfort. Patient may do active range of motion of elbow wrist and forearm with no passive ROM. Elbow ROM is improving.  6. Left distal fibula fracture-weightbearing as tolerated per Dr. Melvyn Novas. Pain not a huge issue at the ankle.    7. Hypertension-Lasix daily. Monitor with increased activity   8. Anxiety/depression-continue Cymbalta 30 mg daily and Seroquel 100 mg twice daily. Trazodone for sleep.  Mood pretty well controlled at present.   9. Tachycardic episodes seem to be related to her deconditioning and other non-acute issues (ie mucus in trach tube etc). HR gradully showing improvement. BP 116/91  Pulse 93  Temp(Src) 98.7 F (37.1 C) (Oral)  Resp 18  Wt 87.3 kg (192 lb 7.4 oz)  SpO2 98%  10. GERD - increase Protonix to bid  11. Insomnia - restarted Trazodone at 50 mg qhs which has helped        LOS (Days) 8 A FACE TO FACE EVALUATION WAS PERFORMED  SWARTZ,ZACHARY T 09/08/2011, 6:43 AM

## 2011-09-08 NOTE — Progress Notes (Signed)
Physical Therapy Discharge Summary  Patient Details  Name: Twylah Bennetts MRN: [id number] Date of Birth: 10/06/1955 Today's Date: 09/08/2011  Patient has met 7 of 7 long term goals due to improved balance, improved gait, decreased pain, improved activity tolerance, improved functional mobility.  Patient to discharge at an ambulatory level Modified Independent.   Patient has been educated and demonstrated safely all mobility needs for discharge.  Recommendation:  Pt will benefit from outpatient PT services to continue to address deficits in strength, gait and activity tolerance and further increase functional independence.  Equipment: No equipment provided.  Pt already owns cane.  Patient/family agrees with progress made and goals achieved: Yes  PT treatment 1300-1325 25 minutes Individual treatment  Pt with no c/o pain.  Gait training with cane with mod I 200' x 2.  Stair training with 1 railing to simulate home environment 1 flight with mod I.  Curb training to simulate home entry with cane with mod I.  Car transfers to simulated sedan height car with mod I.  Pt educated on energy conservation for home and PT recommendation for outpatient PT follow up.  Pt demonstrates and verbalizes understanding of all functional mobility.  Amoura Ransier 09/08/2011, 2:40 PM

## 2011-09-08 NOTE — Progress Notes (Signed)
Patient ID: [name], female   DOB: 06-13-1956, 55 y.o.   MRN: [id number] Updates & confirmation of d/c & f/up arrangements faxed to [facility] at [facility].

## 2011-09-08 NOTE — Discharge Summary (Signed)
[name], REDER         ACCOUNT NO.:  [id number]  MEDICAL RECORD NO.:  [id number]  LOCATION:  4031                         FACILITY:  [facility]  PHYSICIAN:  Ranelle Oyster, M.D.DATE OF BIRTH:  December 15, 1955  DATE OF ADMISSION:  08/31/2011 DATE OF DISCHARGE:  09/08/2011                              DISCHARGE SUMMARY   DISCHARGE DIAGNOSES: 1. Deconditioning after respiratory failure. 2. Tracheostomy. 3. Sequential compression devices for deep vein thrombosis     prophylaxis. 4. Pain management. 5. Left radial head fracture with open reduction and fixation. 6. Left distal fibula fracture. 7. Hypertension. 8. Anxiety with depression.  HISTORY OF PRESENT ILLNESS:  This is a 55 year old right-handed female with recent left elbow surgery with open reduction and internal fixation of displaced radial head fracture per Dr. Orlan Leavens on November 24 as well as left distal fibula fracture.  She was discharged home, admitted November 25 with altered mental status with persistent hypoxia, felt to be related to possible narcotics and sleeping pill.  Urine drug screen positive for opiates and benzodiazepines.  The patient with progressive respiratory distress, received followup by Critical Care Medicine for suggested aspiration event.  She ultimately received tracheostomy, changed to a #6 cuffless on December 12.  Overall, mental status has improved, but still altered suggesting encephalopathy.  No cranial CT scan was noted.  Working with Passy-Muir valve per Speech Therapy.  She remained nonweightbearing to the left upper extremity after a recent elbow surgery.  Dopplers of upper and lower extremities were negative for deep vein thrombosis.  She was weightbearing as tolerated to the left lower extremity.  Tracheostomy tube remained in place per Speech Therapy.  Recent direct laryngoscopy per Dr. Jenne Pane noted marked edematous at the vocal folds were markedly inflamed and continued  a tracheostomy tube.  She required moderate assist ambulate 5 feet, was admitted for comprehensive rehab program.  PAST MEDICAL HISTORY:  See discharge diagnoses.  SOCIAL HISTORY:  Lives with boyfriend as well as family assistance as needed.  Two-level home, half bath on the main level, 2 steps to entry.  FUNCTIONAL HISTORY PRIOR TO ADMISSION:  Independent with homemaking and ambulation prior to a recent elbow fracture.  FUNCTIONAL STATUS UPON ADMISSION TO REHAB SERVICES:  Moderate assist ambulate 5 feet with one-person handheld assistance.  PHYSICAL EXAMINATION:  VITAL SIGNS:  Blood pressure 151/91, pulse 99, temperature 99 respirations 16. GENERAL:  This was an alert female, well developed. HEENT:  Normocephalic. LUNGS:  Clear to auscultation. CARDIAC:  Regular rate and rhythm. ABDOMEN:  Soft, nontender.  Good bowel sounds. MUSCULOSKELETAL:  Left elbow with limited active and passive range of motion.  Could not extend past 50 degrees and had significant pain on range of motion at the left shoulder abduction.  Tracheostomy tube intact. EXTREMITIES:  Left lower extremity grossly graded 3/5 proximal, 4/5 distally.  REHABILITATION HOSPITAL COURSE:  The patient was admitted to inpatient rehab services with therapies initiated on a 3-hour daily basis consisting of physical therapy, occupational therapy, speech therapy, and rehabilitation nursing.  The following issues were addressed during the patient's rehabilitation stay.  Pertaining to Ms. Langsam's deconditioning after respiratory failure, tracheostomy tube intact. Oxygen saturations greater than 90% on room air.  She had  a #6 cuffless in place.  She is to follow up with Dr. Jenne Pane to the ENT for tracheostomy management as advised.  She was maintaining a regular diet. She continued with sequential compression devices for deep vein thrombosis prophylaxis with venous Doppler studies negative.  Noted recent left radial head  fracture with open reduction and internal fixation on November 24.  She was nonweightbearing at the elbow per Orthopedic Services as well as weightbearing as tolerated for left distal fibula fracture.  Her blood pressures are well controlled on low- dose Lasix.  She exhibited no signs of fluid overload.  She did have a long history of anxiety and depression.  She continued with Cymbalta as well as Seroquel with emotional support provided.  The patient received weekly collaborative interdisciplinary team conferences to discuss estimated length of stay, family teaching, and any barriers to discharge.  She was continent of bowel and bladder.  She required supervision to minimal assist for basic mobility with handheld assistance.  Needing some assistance for adjusting her clothing prior to toileting.  She was ambulating 150 feet with minimal assistance. Overall, her strength and endurance greatly improved.  She was encouraged overall progress.  Plan is to be discharge to home with ongoing therapies as dictated per [employer].  DISCHARGE MEDICATIONS: 1. Cymbalta 30 mg daily. 2. Lasix 40 mg daily. 3. Hydrocodone 2 tablets every 6 hours as needed pain. 4. Protonix 40 mg twice daily. 5. Klor-Con 40 mEq daily. 6. Seroquel 100 mg twice daily. 7. Voltaren gel applied to affected area 3 times daily.  Her diet was regular.  SPECIAL INSTRUCTIONS:  The patient should follow up with Dr. Jenne Pane of ENT to establish tracheostomy management, currently with a #6 cuffless trach.  She should make contact to see him within 2 weeks.  She will see Dr. Faith Rogue back at the outpatient rehab center as advised.  Home health therapies had been arranged.  She should see Dr. Orlan Leavens in Orthopedic Services in 2 weeks, call for appointment.     Mariam Dollar, P.A.   ______________________________ Ranelle Oyster, M.D.    DA/MEDQ  D:  09/08/2011  T:  09/08/2011  Job:  [id number]

## 2011-09-08 NOTE — Progress Notes (Signed)
Occupational Therapy Discharge Summary  Patient Details  Name: [name] MRN: [id number] Date of Birth: 08/20/1956 Today's Date: 09/08/2011  Patient has met 11 of 11 long term goals due to improved activity tolerance, improved balance, postural control, functional use of  LEFT upper extremity, improved attention, improved awareness and improved coordination.  Patient's care partner is independent to provide the necessary physical and cognitive assistance at discharge.  Pt currently can perform functional ambulation with a cane mod I, mod I for toilet transfers and toileting, supervision for transfer into tub  With tub bench and for bathing; mod I for dressing without braces.  Pt issued a trach collar protector for showering and educated Harvie Heck (significant other) on donning it.  Pt also has bilateral neoprene braces for knees and bilateral ASO  she wears for prolonged standing and activity on her feet;  she does need assist to don these- Harvie Heck can assist.  Recommendation:  Patient will continue to received OT services in an outpatient setting to continue to advance functional skills in the area of ADL.  Equipment: Equipment provided: tub bench  Patient/family agrees with progress made and goals achieved: Yes  GRAD DAY: Education with pt and Randy on trach collar shower protector, tub bench use, bathroom set up, home safety precautions, reminders of precautions for left UE.  Perform bathing at shower level in ADL apartment using tub bench for transfer into tub, sit to stand mod I- pt only needed setup to don trach collar protector, mod I for dressing in a chair, using a stool to for donning shoes.  [name] 09/08/2011, 1:12 PM

## 2011-09-16 ENCOUNTER — Ambulatory Visit
Admission: RE | Admit: 2011-09-16 | Discharge: 2011-09-16 | Disposition: A | Discharge status: Home / Self Care | Payer: No Typology Code available for payment source | Source: Ambulatory Visit | Attending: Family Medicine | Admitting: Family Medicine

## 2011-09-16 ENCOUNTER — Ambulatory Visit (INDEPENDENT_AMBULATORY_CARE_PROVIDER_SITE_OTHER): Payer: No Typology Code available for payment source | Admitting: Family Medicine

## 2011-09-16 ENCOUNTER — Encounter: Payer: Self-pay | Admitting: Family Medicine

## 2011-09-16 ENCOUNTER — Other Ambulatory Visit: Payer: Self-pay | Admitting: Family Medicine

## 2011-09-16 ENCOUNTER — Ambulatory Visit
Payer: No Typology Code available for payment source | Attending: Physical Medicine & Rehabilitation | Admitting: Physical Therapy

## 2011-09-16 ENCOUNTER — Ambulatory Visit: Payer: No Typology Code available for payment source | Admitting: Occupational Therapy

## 2011-09-16 VITALS — BP 114/72 | HR 129 | Temp 98.2°F | Wt 193.0 lb

## 2011-09-16 DIAGNOSIS — M6281 Muscle weakness (generalized): Secondary | ICD-10-CM | POA: Insufficient documentation

## 2011-09-16 DIAGNOSIS — R5381 Other malaise: Secondary | ICD-10-CM | POA: Insufficient documentation

## 2011-09-16 DIAGNOSIS — Z1231 Encounter for screening mammogram for malignant neoplasm of breast: Secondary | ICD-10-CM

## 2011-09-16 DIAGNOSIS — IMO0001 Reserved for inherently not codable concepts without codable children: Secondary | ICD-10-CM | POA: Insufficient documentation

## 2011-09-16 DIAGNOSIS — J690 Pneumonitis due to inhalation of food and vomit: Secondary | ICD-10-CM

## 2011-09-16 DIAGNOSIS — J9690 Respiratory failure, unspecified, unspecified whether with hypoxia or hypercapnia: Secondary | ICD-10-CM

## 2011-09-16 DIAGNOSIS — J9600 Acute respiratory failure, unspecified whether with hypoxia or hypercapnia: Secondary | ICD-10-CM

## 2011-09-16 MED ORDER — TRAZODONE HCL 50 MG PO TABS: 25.0000 mg | ORAL_TABLET | Freq: Every day | ORAL | Status: DC

## 2011-09-16 MED ORDER — PANTOPRAZOLE SODIUM 40 MG PO TBEC: 40.0000 mg | DELAYED_RELEASE_TABLET | Freq: Two times a day (BID) | ORAL | Status: DC

## 2011-09-16 MED ORDER — ZOLPIDEM TARTRATE 5 MG PO TABS: 2.5000 mg | ORAL_TABLET | Freq: Every evening | ORAL | Status: DC | PRN

## 2011-09-16 MED ORDER — HYDROCODONE-ACETAMINOPHEN 5-325 MG PO TABS: 1.0000 | ORAL_TABLET | Freq: Four times a day (QID) | ORAL | Status: AC | PRN

## 2011-09-16 NOTE — Progress Notes (Signed)
  Subjective:    Patient ID: [name], female    DOB: September 23, 1955, 56 y.o.   MRN: [id number]  HPI Here for follow up after several hospital stays. She fell on 07-30-11 and had a left radial head fracture and a left distal fibula fracture. A Cam walker was applied to the leg, and she underwent an ORIF surgery per Dr. Gilman Schmidt on 08-06-11. She was sent home the next day on 08-07-11 and was found unresponsive on the floor by her husband. It was felt that she had an aspiration event and became hypoxic. She was readmitted and was placed on a ventilator. A tracheostomy was opened per Dr. Christia Reading. She spent 3 weeks in ICU. She has recovered from this, though the tracheostomy is still in place. She spent 8 days in rehab under the care of Dr. Faith Rogue, and went home on 09-08-11. She has continued with outpatient PT since then. She feels better although she is still weak.    Review of Systems  Constitutional: Positive for fatigue.  HENT: Negative.   Respiratory: Negative.   Cardiovascular: Negative.   Gastrointestinal: Negative.   Neurological: Negative.        Objective:   Physical Exam  Constitutional: She appears well-developed and well-nourished.  Neck:       Janina Mayo is in place   Cardiovascular: Normal rate, regular rhythm, normal heart sounds and intact distal pulses.   Pulmonary/Chest: Effort normal and breath sounds normal.  Lymphadenopathy:    She has no cervical adenopathy.          Assessment & Plan:  Recovering well as above. We will see her back in one month for a cpx and fasting labs.

## 2011-09-18 ENCOUNTER — Encounter (HOSPITAL_COMMUNITY): Payer: Self-pay | Admitting: *Deleted

## 2011-09-18 ENCOUNTER — Emergency Department (HOSPITAL_COMMUNITY): Payer: No Typology Code available for payment source

## 2011-09-18 ENCOUNTER — Emergency Department (HOSPITAL_COMMUNITY)
Admission: EM | Admit: 2011-09-18 | Discharge: 2011-09-18 | Disposition: A | Discharge status: Home / Self Care | Payer: No Typology Code available for payment source | Attending: Emergency Medicine | Admitting: Emergency Medicine

## 2011-09-18 DIAGNOSIS — M25539 Pain in unspecified wrist: Secondary | ICD-10-CM | POA: Insufficient documentation

## 2011-09-18 DIAGNOSIS — S022XXA Fracture of nasal bones, initial encounter for closed fracture: Secondary | ICD-10-CM

## 2011-09-18 DIAGNOSIS — Z87828 Personal history of other (healed) physical injury and trauma: Secondary | ICD-10-CM | POA: Insufficient documentation

## 2011-09-18 DIAGNOSIS — W010XXA Fall on same level from slipping, tripping and stumbling without subsequent striking against object, initial encounter: Secondary | ICD-10-CM | POA: Insufficient documentation

## 2011-09-18 DIAGNOSIS — W19XXXA Unspecified fall, initial encounter: Secondary | ICD-10-CM

## 2011-09-18 MED ORDER — CEPHALEXIN 250 MG PO CAPS: 250.0000 mg | ORAL_CAPSULE | Freq: Four times a day (QID) | ORAL | Status: AC

## 2011-09-18 NOTE — ED Notes (Signed)
Pt was recently in ICU for pna and breaking L arm and leg, pt has stoma, no difficulty breathing.

## 2011-09-18 NOTE — ED Notes (Signed)
Was walking outside to car and went to make a step when she fell, hitting face/nose, also having severe R wrist pain where she tried to catch herself. Pt denies LOC after fall. Pt has small abrasion on nose, small amount of bleeding. Was recently in ICU with pna, L broken arm, L broken leg. Pt does have stoma.

## 2011-09-18 NOTE — ED Provider Notes (Signed)
History     CSN: [id number]  Arrival date & time 09/18/11  1311   First MD Initiated Contact with Patient 09/18/11 1354      Chief Complaint  Patient presents with  . Fall  . Abrasion    nose    (Consider location/radiation/quality/duration/timing/severity/associated sxs/prior treatment) Patient is a 56 y.o. female presenting with fall. The history is provided by the patient. No language interpreter was used.  Fall The accident occurred 1 to 2 hours ago. The fall occurred while walking. She landed on concrete. The point of impact was the right wrist (face). The pain is present in the right wrist (nose). The pain is at a severity of 7/10. The pain is moderate. She was ambulatory at the scene. There was no entrapment after the fall. There was no drug use involved in the accident. There was no alcohol use involved in the accident. Pertinent negatives include no fever, no numbness, no abdominal pain, no nausea, no vomiting, no headaches and no loss of consciousness. The symptoms are aggravated by activity. She has tried ice for the symptoms.  Pt here today with c/o she tripped and  fell with R wrist, R knee and nasal pain.  Swelling and abrasion to nose with slight deformity. No defomity or swelling to the R wrist.  Abrasion to the R knee.   States that she was recently discharged after a stay in rehab from a prior fall where she was surf fishing and broke her LLE and LUE.  She then went home and aspirated and returned to a stay in critical care and was trached.  She now has a plugged trach.    Past Medical History  Diagnosis Date  . Ovarian cyst   . Pneumonia     hx x4  . Cancer     precancerous cervix  . GERD (gastroesophageal reflux disease)   . Headache   . Hiatal hernia   . PONV (postoperative nausea and vomiting) 08/06/11  . Difficult intubation   . Aspiration pneumonia   . Respiratory failure     Past Surgical History  Procedure Date  . Cesarean section 87  . Knee surgery  14    rt arthroscopy  . Ovarian cyst removal 80  . Abdominal hysterectomy 88  . Orif elbow fracture 08/06/2011    Procedure: OPEN REDUCTION INTERNAL FIXATION (ORIF) ELBOW/OLECRANON FRACTURE;  Surgeon: Sharma Covert;  Location: [facility] OR;  Service: Orthopedics;  Laterality: Left;  . Direct laryngoscopy 08/19/2011    Procedure: DIRECT LARYNGOSCOPY;  Surgeon: Antony Contras;  Location: [facility];  Service: ENT;  Laterality: N/A;  . Tracheostomy tube placement 08/19/2011    Procedure: TRACHEOSTOMY;  Surgeon: Antony Contras;  Location: [facility];  Service: ENT;  Laterality: N/A;  tracheoscopy    History reviewed. No pertinent family history.  History  Substance Use Topics  . Smoking status: Never Smoker   . Smokeless tobacco: Never Used  . Alcohol Use: 1.0 oz/week    2 drink(s) per week    OB History    Grav Para Term Preterm Abortions TAB SAB Ect Mult Living                  Review of Systems  Constitutional: Negative for fever.  Gastrointestinal: Negative for nausea, vomiting and abdominal pain.  Neurological: Negative for loss of consciousness, numbness and headaches.  All other systems reviewed and are negative.    Allergies  Meperidine hcl  Home Medications   Current  Outpatient Rx  Name Route Sig Dispense Refill  . DOCUSATE SODIUM 100 MG PO CAPS Oral Take 100 mg by mouth 2 (two) times daily.      . DULOXETINE HCL 30 MG PO CPEP Oral Take 30 mg by mouth daily.     . FUROSEMIDE 40 MG PO TABS Oral Take 1 tablet (40 mg total) by mouth daily. 30 tablet 1  . HYDROCODONE-ACETAMINOPHEN 5-325 MG PO TABS Oral Take 1 tablet by mouth every 6 (six) hours as needed for pain. 30 tablet 0  . PANTOPRAZOLE SODIUM 40 MG PO TBEC Oral Take 1 tablet (40 mg total) by mouth 2 (two) times daily. 60 tablet 11  . POTASSIUM CHLORIDE CRYS ER 20 MEQ PO TBCR Oral Take 2 tablets (40 mEq total) by mouth daily. 30 tablet 1  . QUETIAPINE FUMARATE 100 MG PO TABS Oral Take 1 tablet (100 mg total) by mouth 2 (two)  times daily.    . SULFAMETHOXAZOLE-TMP DS 800-160 MG PO TABS Oral Take 1 tablet by mouth 2 (two) times daily. 3 day course of therapy started 09/17/11; not completed.     . TRAZODONE HCL 50 MG PO TABS Oral Take 0.5 tablets (25 mg total) by mouth at bedtime. 30 tablet 0  . ZOLPIDEM TARTRATE 5 MG PO TABS Oral Take 0.5 tablets (2.5 mg total) by mouth at bedtime as needed for sleep. 15 tablet 0    BP 126/86  Pulse 112  Temp(Src) 98.1 F (36.7 C) (Oral)  Resp 18  SpO2 98%  Physical Exam  Nursing note and vitals reviewed. Constitutional: She is oriented to person, place, and time. She appears well-developed and well-nourished. No distress.  HENT:  Head: Normocephalic.       Nasal edema and slight defomity.  Eyes: Conjunctivae and EOM are normal. Pupils are equal, round, and reactive to light.  Neck: Normal range of motion.  Cardiovascular: Normal rate and regular rhythm.   Pulmonary/Chest: Effort normal and breath sounds normal.  Abdominal: Soft.  Musculoskeletal: She exhibits tenderness.       R wrist tender. Tender nose.   Neurological: She is alert and oriented to person, place, and time.  Skin: Skin is warm.       Abrasion to bridge of nose with bleeding cleaned with ns and bacitracin applied.  No bony matter protruding through.  Psychiatric: She has a normal mood and affect.    ED Course  Procedures (including critical care time)  Labs Reviewed - No data to display No results found.   No diagnosis found.    MDM  Fall today tripped and sustained R wrist pain and fractured nasal bridge.  Has appointment withENT doc tomorrow at 930am for prior trach from a fall and aspiration 1 month ago.  R wrist negative for fracture.         Jethro Bastos, NP 09/19/11 1231

## 2011-09-22 ENCOUNTER — Encounter: Payer: No Typology Code available for payment source | Admitting: Occupational Therapy

## 2011-09-22 ENCOUNTER — Encounter: Payer: No Typology Code available for payment source | Attending: Neurosurgery | Admitting: Neurosurgery

## 2011-09-22 ENCOUNTER — Ambulatory Visit: Payer: No Typology Code available for payment source | Admitting: Physical Therapy

## 2011-09-22 DIAGNOSIS — S42309S Unspecified fracture of shaft of humerus, unspecified arm, sequela: Secondary | ICD-10-CM | POA: Insufficient documentation

## 2011-09-22 DIAGNOSIS — M25539 Pain in unspecified wrist: Secondary | ICD-10-CM

## 2011-09-22 DIAGNOSIS — R5381 Other malaise: Secondary | ICD-10-CM | POA: Insufficient documentation

## 2011-09-22 DIAGNOSIS — F329 Major depressive disorder, single episode, unspecified: Secondary | ICD-10-CM | POA: Insufficient documentation

## 2011-09-22 DIAGNOSIS — W19XXXA Unspecified fall, initial encounter: Secondary | ICD-10-CM | POA: Insufficient documentation

## 2011-09-22 DIAGNOSIS — F411 Generalized anxiety disorder: Secondary | ICD-10-CM | POA: Insufficient documentation

## 2011-09-22 DIAGNOSIS — S022XXA Fracture of nasal bones, initial encounter for closed fracture: Secondary | ICD-10-CM | POA: Insufficient documentation

## 2011-09-22 DIAGNOSIS — I10 Essential (primary) hypertension: Secondary | ICD-10-CM | POA: Insufficient documentation

## 2011-09-22 DIAGNOSIS — F3289 Other specified depressive episodes: Secondary | ICD-10-CM | POA: Insufficient documentation

## 2011-09-22 DIAGNOSIS — X58XXXS Exposure to other specified factors, sequela: Secondary | ICD-10-CM | POA: Insufficient documentation

## 2011-09-22 DIAGNOSIS — Z8781 Personal history of (healed) traumatic fracture: Secondary | ICD-10-CM | POA: Insufficient documentation

## 2011-09-22 DIAGNOSIS — Z930 Tracheostomy status: Secondary | ICD-10-CM | POA: Insufficient documentation

## 2011-09-22 DIAGNOSIS — G894 Chronic pain syndrome: Secondary | ICD-10-CM

## 2011-09-22 NOTE — ED Provider Notes (Signed)
Medical screening examination/treatment/procedure(s) were performed by non-physician practitioner and as supervising physician I was immediately available for consultation/collaboration.   Gwyneth Sprout, MD 09/22/11 [phone]

## 2011-09-23 NOTE — Assessment & Plan Note (Signed)
The patient was seen in the hospital in [facility] from December 19 to December 27 after respiratory failure.  The patient had a left radial head fracture with open reduction and fixation with Dr. Melvyn Novas and subsequently after discharge home was found unresponsive with altered mental status and hypoxia thought to be related to narcotics and sleeping pills.  She was drug screen positive for opiates and benzodiazepines.  She since that time was hospitalized with tracheostomy and respiratory failure, then moved to the rehab floor for that period of time mentioned above.  The patient tells me today that she is followed by Dr. Melvyn Novas for her arm, still like she is doing much better.  She is in physical therapy and [facility] outpatient therapy at this time and not doing well with that.  She did see Dr. Jenne Pane, ENT in town for a nose fracture.  She fell this past week and fractured her septum.  He has left her trach in place until she sees him tomorrow.  He is going to decide if he is going to operate on her nose and she is leaning toward not having it done.  Her eyes are raccoon today from her fall but she states they are improving greatly. The patient also sees Dr. Eloisa Northern in [place] for pain management.  He currently has her on trazodone and Ambien at night, hydrocodone and ibuprofen during the day as well as Cymbalta.  She used to take Lyrica, she is no longer taking it.  The patient rates her average pain at a 7.  It is a burning, dull, aching type pain.  General activity level is 2-6.  Pain is worse with standing.  Rest, therapy, and medication helps.  She walks with and without assistance.  She does use a cane or walker at times.  She climbs steps.  She does not drive.  She can walk about 20 minutes at a time.  Functionally she is on disability. She states she is employed by the [employer] but is going to retire.  REVIEW OF SYSTEMS:   Notable for difficulties described above as well as some weakness, trouble walking, dizziness, constipation, night sweats. No suicidal thoughts.  PAST MEDICAL HISTORY:  Significant for the difficulties mentioned above as well as knee arthroscopic surgery in January 2012, hysterectomy in 1987, C-section in 1985, ovarian cyst removal in 1978.  She is widowed and lives alone with her son.  FAMILY HISTORY:  Significant for alcohol abuse on her father's side and cancer.  PHYSICAL EXAMINATION:  Her blood pressure is 121/65, pulse 113, respirations 16, O2 sats 98 on room air.  Constitutionally she is mildly obese.  She is alert and oriented x3.  She has a slightly altered gait. Strength testing is not checked in the left lower extremity due to history of fractures.  Right side appears to be intact.  Sensation is intact.  Extraocular movements are intact.  Tongue is midline.  Shoulder shrugs are equal.  Speech is clear and fluent.  ASSESSMENT: 1. Deconditioning after respiratory failure. 2. Persistent tracheostomy. 3. Acute nasal fracture cared for by Dr. Jenne Pane. 4. History of pain management, Dr. Haskel Khan. 5. Left radial head fracture with open reduction and internal     fixation, Dr. Melvyn Novas. 6. Left distal tib fibula fracture healed on its own. 7. History of hypertension. 8. Anxiety and depression.  PLAN: 1. She will follow up with Dr. Riley Kill in 6-8 weeks. 2. She will finish her physical  therapy at [facility] outpatient therapy as     recommended by them. 3. She will continue to follow with Dr. Jenne Pane regarding her nasal     fracture. 4. She will continue with Dr. Haskel Khan in [place] regarding her     pain management and her primary care     doctor, Dr. Clent Ridges will follow her up regarding her need for Lasix and     subsequent potassium.  Her questions were encouraged and answered     and again she will follow up with Dr. Riley Kill in about a month and a     half.     [name] L. Blima Dessert Electronically Signed    RLW/MedQ D:  09/22/2011 13:38:49  T:  09/23/2011 07:17:15  Job #:  [id number]

## 2011-09-28 ENCOUNTER — Ambulatory Visit: Payer: No Typology Code available for payment source | Admitting: Physical Therapy

## 2011-10-05 ENCOUNTER — Ambulatory Visit: Payer: No Typology Code available for payment source | Admitting: Physical Therapy

## 2011-10-26 ENCOUNTER — Other Ambulatory Visit (INDEPENDENT_AMBULATORY_CARE_PROVIDER_SITE_OTHER): Payer: No Typology Code available for payment source

## 2011-10-26 ENCOUNTER — Encounter: Payer: No Typology Code available for payment source | Admitting: Physical Medicine & Rehabilitation

## 2011-10-26 DIAGNOSIS — Z0000 Encounter for general adult medical examination without abnormal findings: Secondary | ICD-10-CM

## 2011-10-26 LAB — CBC WITH DIFFERENTIAL/PLATELET
Basophils Absolute: 0 10*3/uL (ref 0.0–0.1)
Eosinophils Absolute: 0.1 10*3/uL (ref 0.0–0.7)
Eosinophils Relative: 2 % (ref 0.0–5.0)
MCV: 73.7 fl — ABNORMAL LOW (ref 78.0–100.0)
Monocytes Absolute: 0.5 10*3/uL (ref 0.1–1.0)
Neutrophils Relative %: 40.5 % — ABNORMAL LOW (ref 43.0–77.0)
Platelets: 272 10*3/uL (ref 150.0–400.0)
RDW: 18.7 % — ABNORMAL HIGH (ref 11.5–14.6)
WBC: 5.9 10*3/uL (ref 4.5–10.5)

## 2011-10-26 LAB — BASIC METABOLIC PANEL
BUN: 14 mg/dL (ref 6–23)
Chloride: 107 mEq/L (ref 96–112)
Creatinine, Ser: 0.8 mg/dL (ref 0.4–1.2)
Glucose, Bld: 100 mg/dL — ABNORMAL HIGH (ref 70–99)

## 2011-10-26 LAB — HEPATIC FUNCTION PANEL
ALT: 23 U/L (ref 0–35)
AST: 29 U/L (ref 0–37)
Albumin: 3.6 g/dL (ref 3.5–5.2)
Alkaline Phosphatase: 109 U/L (ref 39–117)
Bilirubin, Direct: 0 mg/dL (ref 0.0–0.3)
Total Bilirubin: 0.6 mg/dL (ref 0.3–1.2)
Total Protein: 6.8 g/dL (ref 6.0–8.3)

## 2011-10-26 LAB — LIPID PANEL
Cholesterol: 136 mg/dL (ref 0–200)
HDL: 48.8 mg/dL (ref >39.00)
LDL Cholesterol: 71 mg/dL (ref 0–99)
Total CHOL/HDL Ratio: 3
Triglycerides: 80 mg/dL (ref 0.0–149.0)
VLDL: 16 mg/dL (ref 0.0–40.0)

## 2011-10-26 LAB — POCT URINALYSIS DIPSTICK
Blood, UA: NEGATIVE
Ketones, UA: NEGATIVE
Protein, UA: NEGATIVE
Spec Grav, UA: 1.02
Urobilinogen, UA: 0.2
pH, UA: 5.5

## 2011-10-28 NOTE — Progress Notes (Signed)
Quick Note:  Pt aware ______ 

## 2011-11-01 ENCOUNTER — Encounter: Payer: Self-pay | Admitting: Family Medicine

## 2011-11-01 ENCOUNTER — Ambulatory Visit (INDEPENDENT_AMBULATORY_CARE_PROVIDER_SITE_OTHER): Payer: No Typology Code available for payment source | Admitting: Family Medicine

## 2011-11-01 VITALS — BP 140/88 | HR 121 | Temp 97.8°F | Ht 62.5 in | Wt 190.0 lb

## 2011-11-01 DIAGNOSIS — Z0000 Encounter for general adult medical examination without abnormal findings: Secondary | ICD-10-CM

## 2011-11-01 MED ORDER — FERROUS SULFATE 325 (65 FE) MG PO TABS: 325.0000 mg | ORAL_TABLET | Freq: Every day | ORAL | Status: AC

## 2011-11-01 MED ORDER — AZITHROMYCIN 250 MG PO TABS: ORAL_TABLET | ORAL | Status: AC

## 2011-11-01 NOTE — Progress Notes (Signed)
  Subjective:    Patient ID: [name], female    DOB: November 29, 1955, 56 y.o.   MRN: [id number]  HPI 56 yr old female for a cpx. Her main complaint today is 5 days of sinus pressure, PND, ST, an a dry cough. No fever.    Review of Systems  Constitutional: Negative.   HENT: Negative.   Eyes: Negative.   Respiratory: Negative.   Cardiovascular: Negative.   Gastrointestinal: Negative.   Genitourinary: Negative for dysuria, urgency, frequency, hematuria, flank pain, decreased urine volume, enuresis, difficulty urinating, pelvic pain and dyspareunia.  Musculoskeletal: Negative.   Skin: Negative.   Neurological: Negative.   Hematological: Negative.   Psychiatric/Behavioral: Negative.        Objective:   Physical Exam  Constitutional: She is oriented to person, place, and time. She appears well-developed and well-nourished. No distress.  HENT:  Head: Normocephalic and atraumatic.  Right Ear: External ear normal.  Left Ear: External ear normal.  Nose: Nose normal.  Mouth/Throat: Oropharynx is clear and moist. No oropharyngeal exudate.  Eyes: Conjunctivae and EOM are normal. Pupils are equal, round, and reactive to light. No scleral icterus.  Neck: Normal range of motion. Neck supple. No JVD present. No thyromegaly present.  Cardiovascular: Normal rate, regular rhythm, normal heart sounds and intact distal pulses.  Exam reveals no gallop and no friction rub.   No murmur heard. Pulmonary/Chest: Effort normal and breath sounds normal. No respiratory distress. She has no wheezes. She has no rales. She exhibits no tenderness.  Abdominal: Soft. Bowel sounds are normal. She exhibits no distension and no mass. There is no tenderness. There is no rebound and no guarding.  Musculoskeletal: Normal range of motion. She exhibits no edema and no tenderness.  Lymphadenopathy:    She has no cervical adenopathy.  Neurological: She is alert and oriented to person, place, and time. She has normal  reflexes. No cranial nerve deficit. She exhibits normal muscle tone. Coordination normal.  Skin: Skin is warm and dry. No rash noted. No erythema.  Psychiatric: She has a normal mood and affect. Her behavior is normal. Judgment and thought content normal.          Assessment & Plan:  Well exam. We will add an iron supplement daily for her anemia. Treat the sinusitis with a Zpack.

## 2011-11-07 ENCOUNTER — Other Ambulatory Visit: Payer: Self-pay | Admitting: Family Medicine

## 2011-11-07 ENCOUNTER — Telehealth: Payer: Self-pay | Admitting: *Deleted

## 2011-11-07 NOTE — Telephone Encounter (Signed)
Pt was seen last week and was presribed a Zpack.  She has completed the prescription and feels that she needs another one.  Starting to feel better but still has congestion.  Please send rx to [facility].

## 2011-11-08 MED ORDER — AZITHROMYCIN 250 MG PO TABS: ORAL_TABLET | ORAL | Status: AC

## 2011-11-08 NOTE — Telephone Encounter (Signed)
Call in another Zpack  

## 2011-11-08 NOTE — Telephone Encounter (Signed)
Script sent e-scribe and spoke with pt. 

## 2011-11-29 ENCOUNTER — Encounter: Payer: Self-pay | Admitting: Physical Medicine & Rehabilitation

## 2011-12-08 ENCOUNTER — Encounter: Payer: Self-pay | Admitting: Physical Medicine & Rehabilitation

## 2013-04-24 IMAGING — CR DG WRIST COMPLETE 3+V*L*
4 series · 4 of 4 positions shown · non-contrast
Comparison: None.

CLINICAL DATA: Fall, left wrist pain

LEFT WRIST - COMPLETE 3+ VIEW

[x wrist pa left]
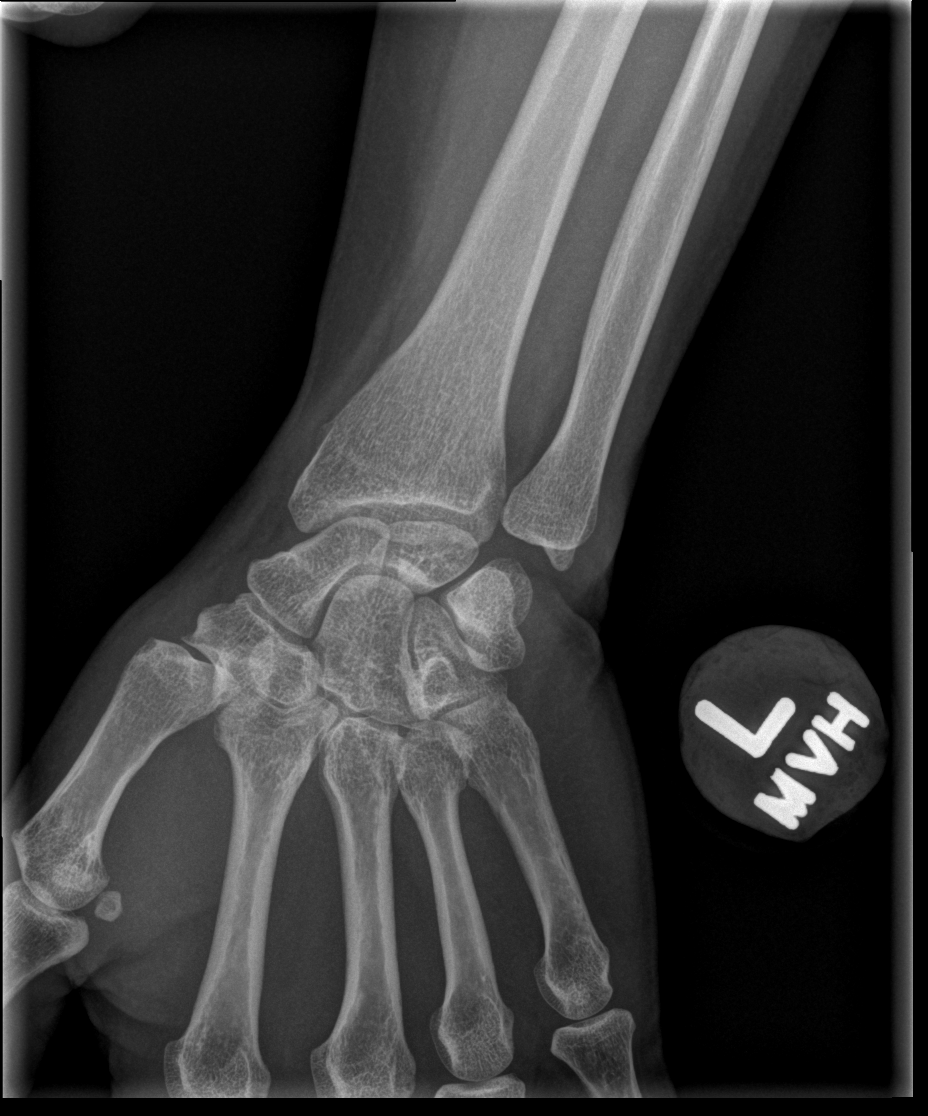

[x wrist obl left]
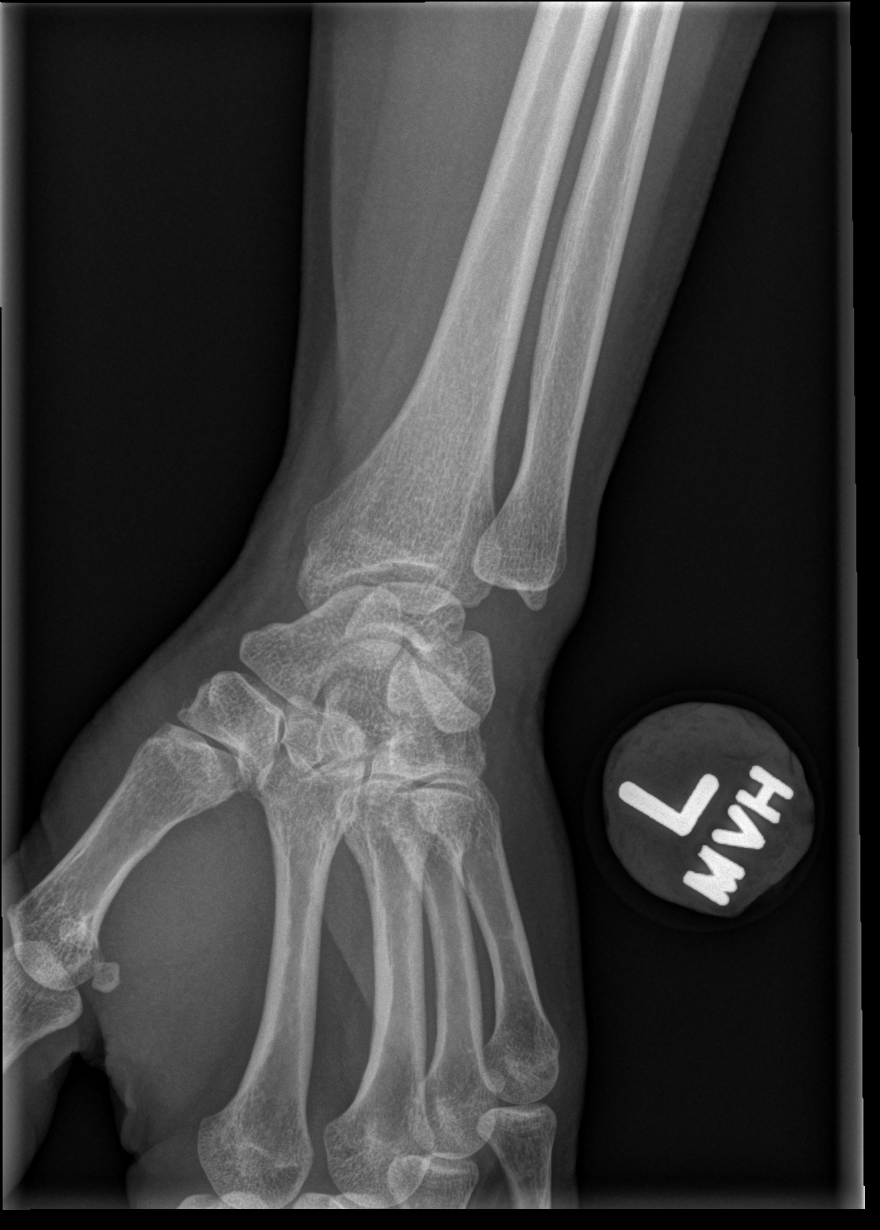

[x wrist lat left]
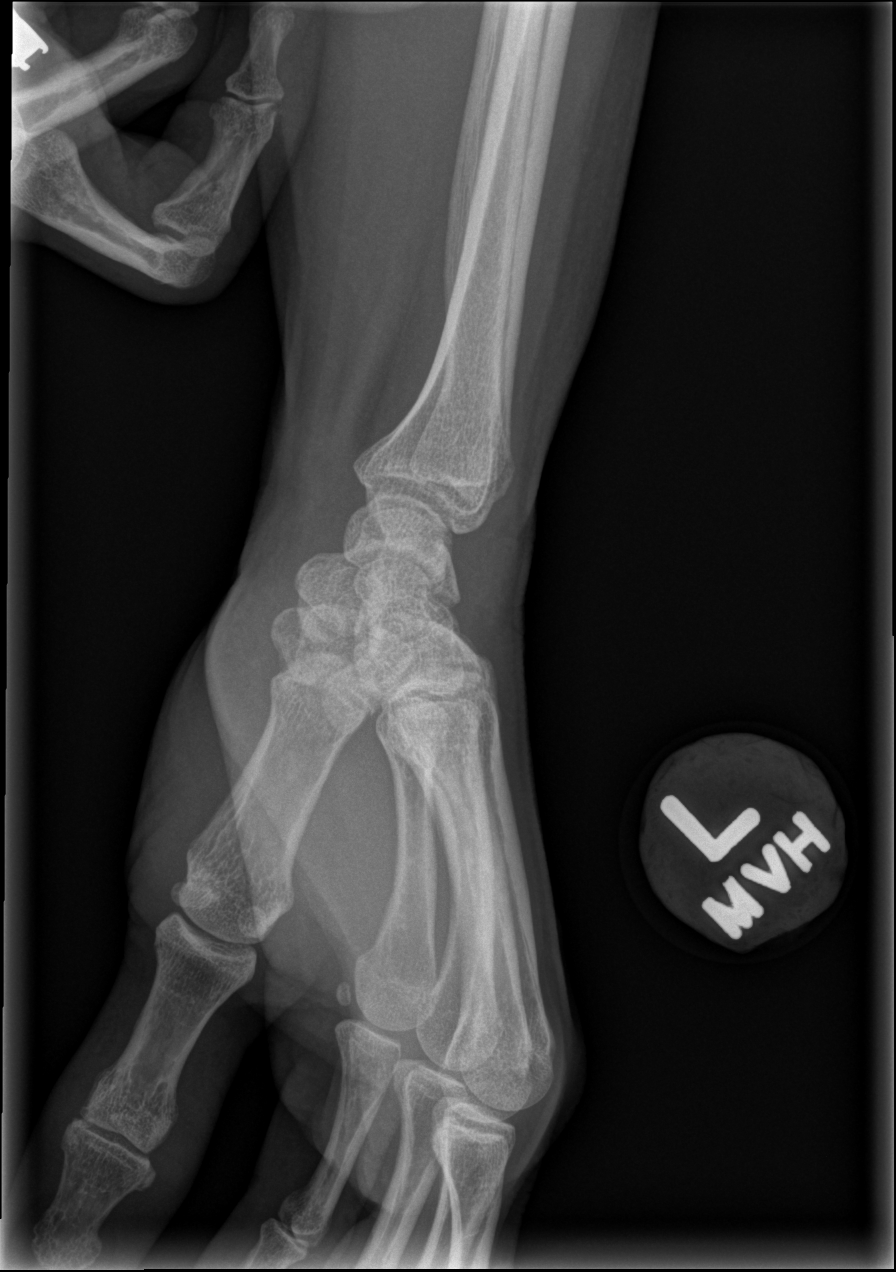

[x wrist navicular view left]
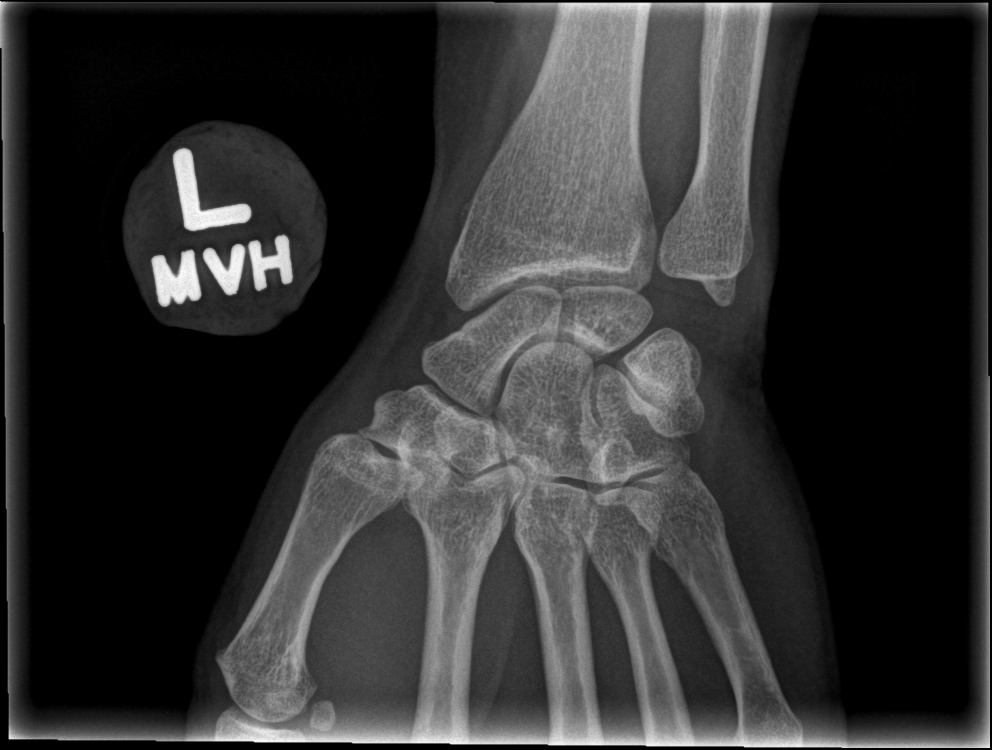

[4 of 4 positions shown; findings below may reference images not displayed]

FINDINGS: No fracture or dislocation.  No soft tissue abnormality.
No radiopaque foreign body.
IMPRESSION: No acute osseous abnormality.

## 2013-04-24 IMAGING — CR DG HUMERUS 2V *L*
2 series · 2 of 2 positions shown · non-contrast
Comparison: None.

CLINICAL DATA: Fall.  Left arm injury and pain.

LEFT HUMERUS - 2+ VIEW

[t humerus ap left (1 of 2)]
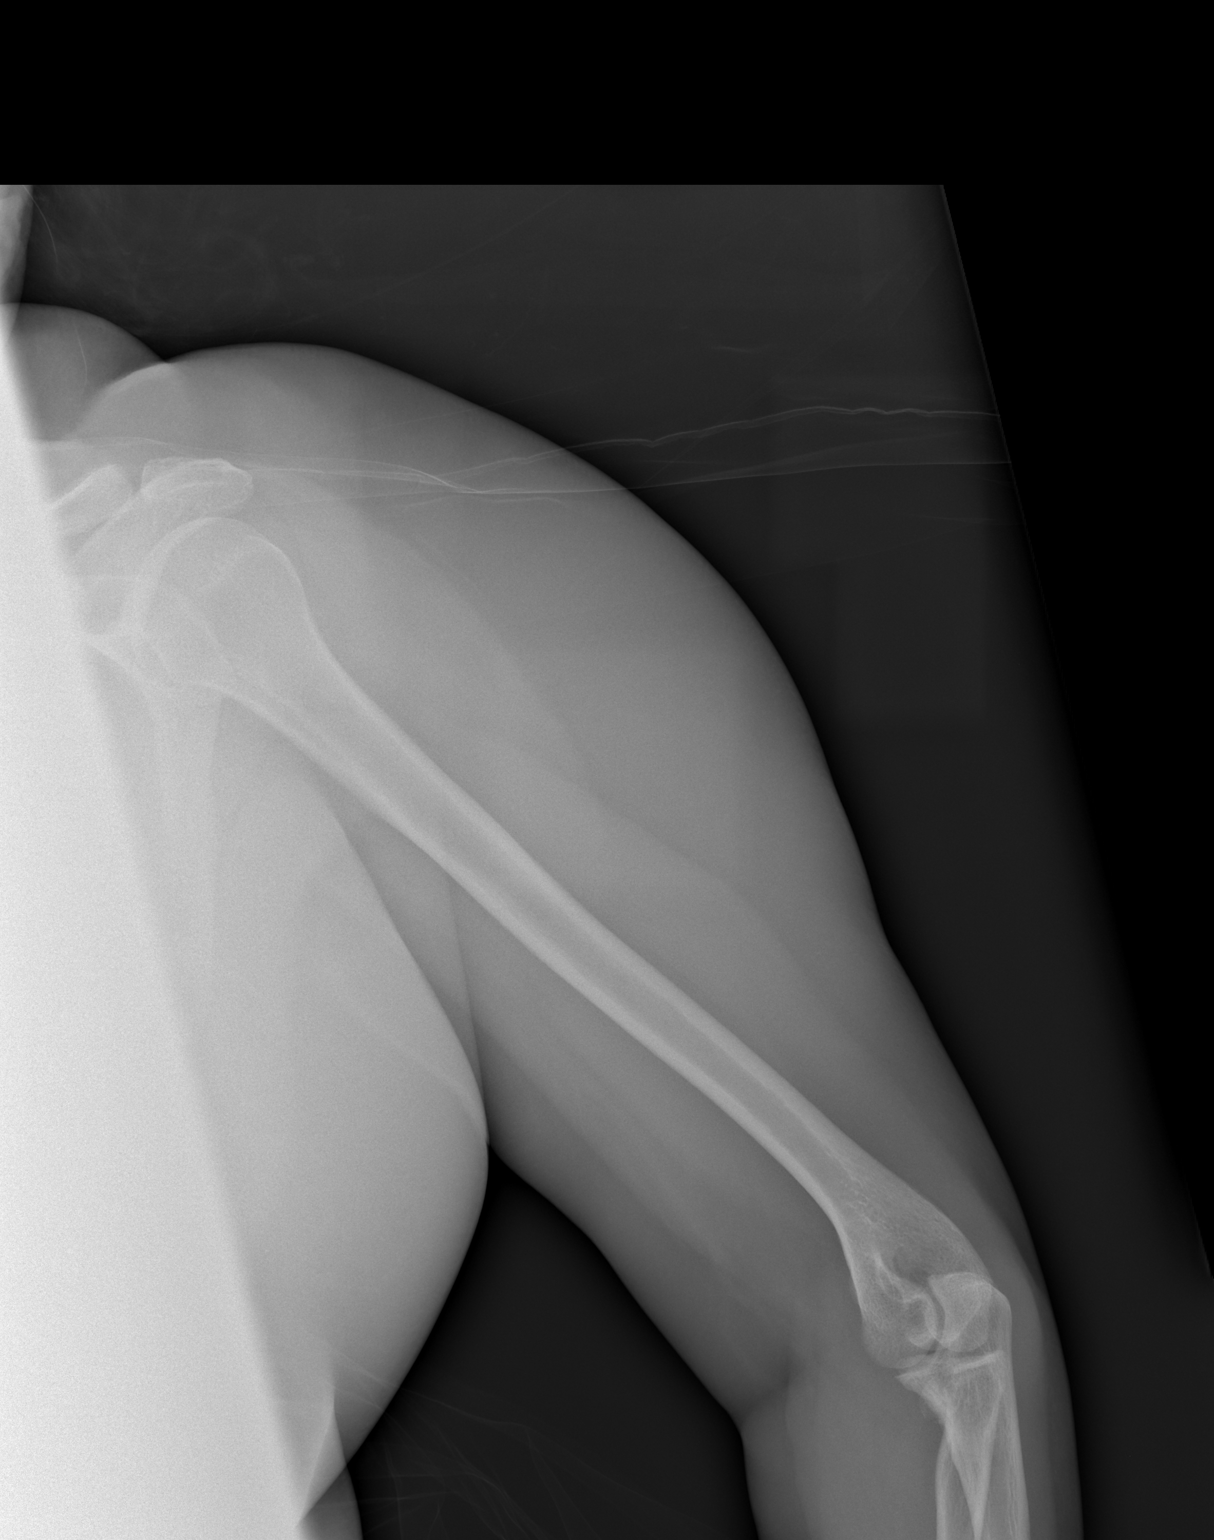

[t humerus ap left (2 of 2)]
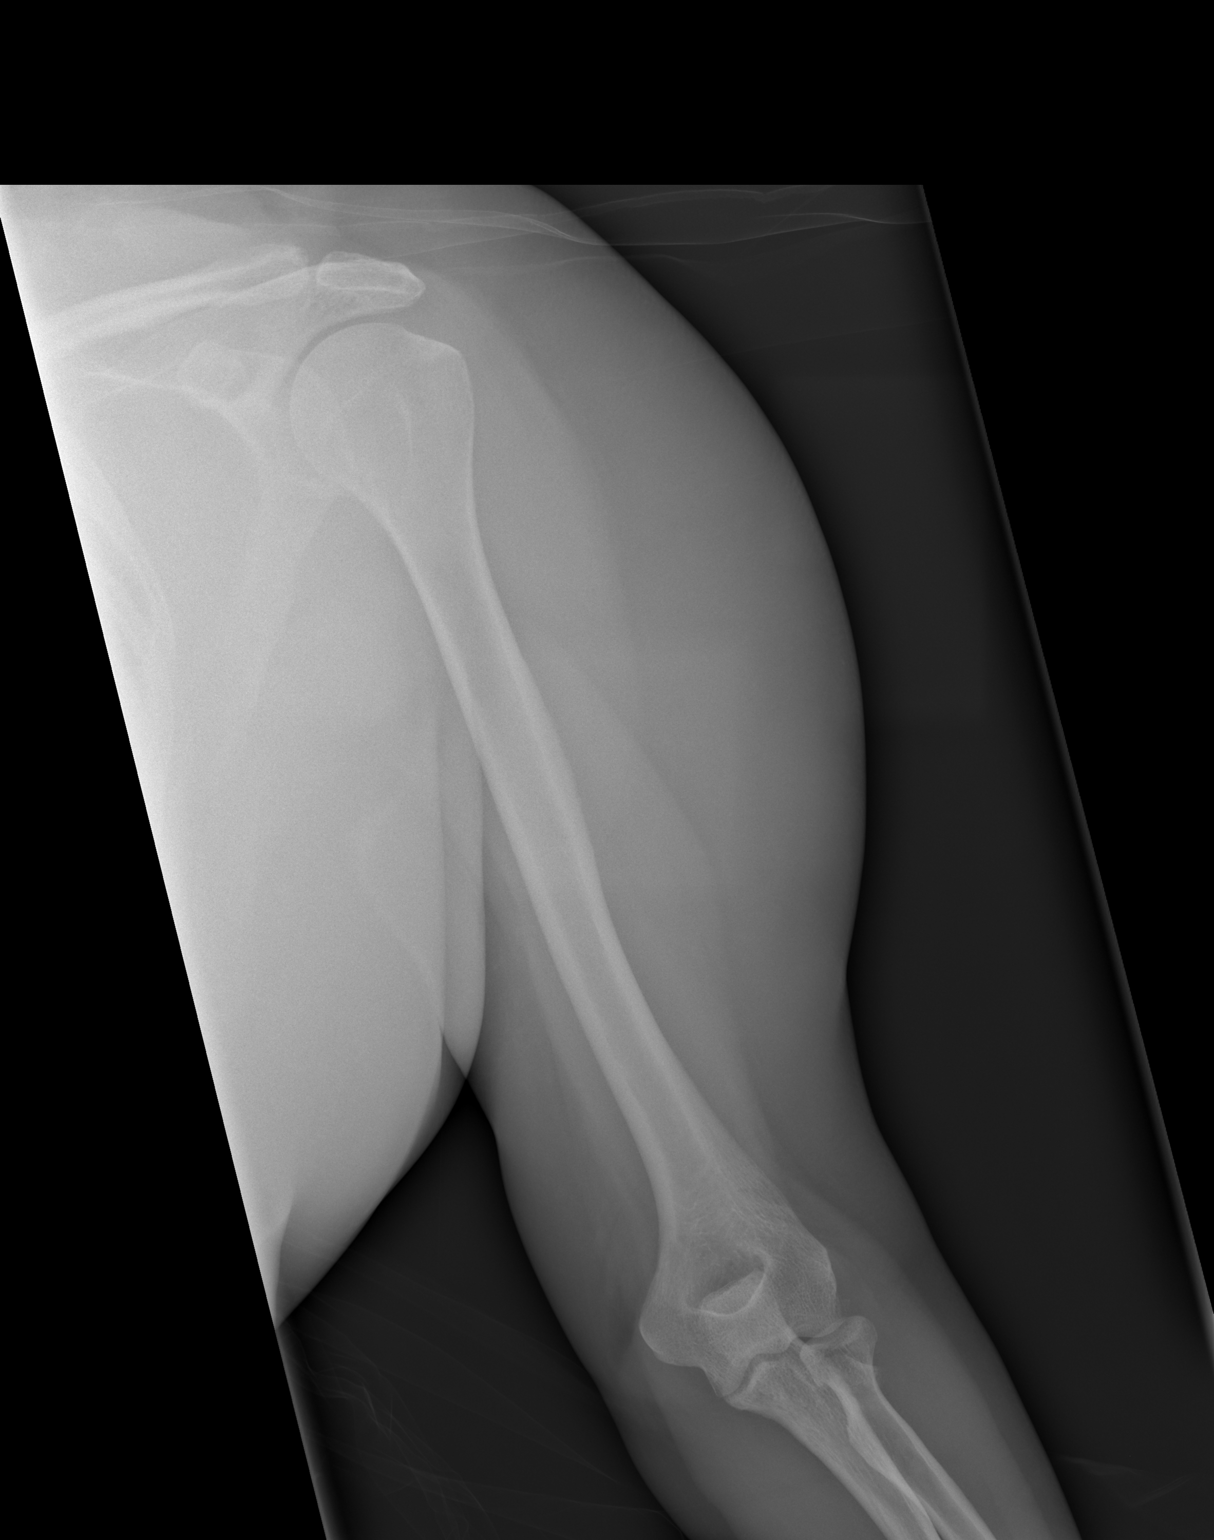

[2 of 2 positions shown; findings below may reference images not displayed]

FINDINGS: No evidence of fracture or other bone lesion.  Soft
tissues are unremarkable.
IMPRESSION: Negative.

## 2013-08-12 HISTORY — PX: COLONOSCOPY: SHX174

## 2013-08-16 ENCOUNTER — Other Ambulatory Visit: Payer: Self-pay

## 2013-08-20 ENCOUNTER — Other Ambulatory Visit (INDEPENDENT_AMBULATORY_CARE_PROVIDER_SITE_OTHER): Payer: BC Managed Care – PPO

## 2013-08-20 DIAGNOSIS — Z0000 Encounter for general adult medical examination without abnormal findings: Secondary | ICD-10-CM

## 2013-08-20 LAB — HEPATIC FUNCTION PANEL
ALT: 27 U/L (ref 0–35)
Bilirubin, Direct: 0 mg/dL (ref 0.0–0.3)
Total Bilirubin: 0.7 mg/dL (ref 0.3–1.2)
Total Protein: 7.2 g/dL (ref 6.0–8.3)

## 2013-08-20 LAB — CBC WITH DIFFERENTIAL/PLATELET
Basophils Absolute: 0 10*3/uL (ref 0.0–0.1)
Basophils Relative: 0.4 % (ref 0.0–3.0)
Eosinophils Absolute: 0.1 10*3/uL (ref 0.0–0.7)
Eosinophils Relative: 2.1 % (ref 0.0–5.0)
HCT: 45.8 % (ref 36.0–46.0)
Lymphs Abs: 3 10*3/uL (ref 0.7–4.0)
MCHC: 33 g/dL (ref 30.0–36.0)
MCV: 89.6 fl (ref 78.0–100.0)
Monocytes Absolute: 0.4 10*3/uL (ref 0.1–1.0)
Neutro Abs: 2.8 10*3/uL (ref 1.4–7.7)
Neutrophils Relative %: 43.9 % (ref 43.0–77.0)
Platelets: 229 10*3/uL (ref 150.0–400.0)
RDW: 15.4 % — ABNORMAL HIGH (ref 11.5–14.6)

## 2013-08-20 LAB — POCT URINALYSIS DIPSTICK
Ketones, UA: NEGATIVE
Leukocytes, UA: NEGATIVE
Nitrite, UA: NEGATIVE
Protein, UA: NEGATIVE
pH, UA: 5.5

## 2013-08-20 LAB — LIPID PANEL
Cholesterol: 170 mg/dL (ref 0–200)
HDL: 51.9 mg/dL (ref >39.00)
LDL Cholesterol: 98 mg/dL (ref 0–99)
Triglycerides: 100 mg/dL (ref 0.0–149.0)

## 2013-08-20 LAB — BASIC METABOLIC PANEL
BUN: 12 mg/dL (ref 6–23)
CO2: 27 mEq/L (ref 19–32)
Calcium: 9.1 mg/dL (ref 8.4–10.5)
GFR: 71.78 mL/min (ref >60.00)
Glucose, Bld: 100 mg/dL — ABNORMAL HIGH (ref 70–99)

## 2013-08-20 LAB — TSH: TSH: 1.34 u[IU]/mL (ref 0.35–5.50)

## 2013-09-17 ENCOUNTER — Encounter: Payer: No Typology Code available for payment source | Admitting: Family Medicine

## 2013-09-17 ENCOUNTER — Encounter: Payer: Self-pay | Admitting: Family Medicine

## 2013-09-17 ENCOUNTER — Ambulatory Visit (INDEPENDENT_AMBULATORY_CARE_PROVIDER_SITE_OTHER): Payer: Federal, State, Local not specified - PPO | Admitting: Family Medicine

## 2013-09-17 VITALS — BP 120/80 | HR 97 | Temp 98.4°F | Ht 61.5 in | Wt 200.0 lb

## 2013-09-17 DIAGNOSIS — Z0000 Encounter for general adult medical examination without abnormal findings: Secondary | ICD-10-CM

## 2013-09-17 NOTE — Progress Notes (Signed)
Pre visit review using our clinic review tool, if applicable. No additional management support is needed unless otherwise documented below in the visit note. 

## 2013-09-17 NOTE — Progress Notes (Signed)
   Subjective:    Patient ID: [name], female    DOB: [birth year], 58 y.o.   MRN: [id number]  HPI 58 yr old female for a cpx. She feels well.    Review of Systems  Constitutional: Negative.   HENT: Negative.   Eyes: Negative.   Respiratory: Negative.   Cardiovascular: Negative.   Gastrointestinal: Negative.   Genitourinary: Negative for dysuria, urgency, frequency, hematuria, flank pain, decreased urine volume, enuresis, difficulty urinating, pelvic pain and dyspareunia.  Musculoskeletal: Negative.   Skin: Negative.   Neurological: Negative.   Psychiatric/Behavioral: Negative.        Objective:   Physical Exam  Constitutional: She is oriented to person, place, and time. She appears well-developed and well-nourished. No distress.  HENT:  Head: Normocephalic and atraumatic.  Right Ear: External ear normal.  Left Ear: External ear normal.  Nose: Nose normal.  Mouth/Throat: Oropharynx is clear and moist. No oropharyngeal exudate.  Eyes: Conjunctivae and EOM are normal. Pupils are equal, round, and reactive to light. No scleral icterus.  Neck: Normal range of motion. Neck supple. No JVD present. No thyromegaly present.  Cardiovascular: Normal rate, regular rhythm, normal heart sounds and intact distal pulses.  Exam reveals no gallop and no friction rub.   No murmur heard. EKG normal   Pulmonary/Chest: Effort normal and breath sounds normal. No respiratory distress. She has no wheezes. She has no rales. She exhibits no tenderness.  Abdominal: Soft. Bowel sounds are normal. She exhibits no distension and no mass. There is no tenderness. There is no rebound and no guarding.  Musculoskeletal: Normal range of motion. She exhibits no edema and no tenderness.  Lymphadenopathy:    She has no cervical adenopathy.  Neurological: She is alert and oriented to person, place, and time. She has normal reflexes. No cranial nerve deficit. She exhibits normal muscle tone. Coordination  normal.  Skin: Skin is warm and dry. No rash noted. No erythema.  Psychiatric: She has a normal mood and affect. Her behavior is normal. Judgment and thought content normal.          Assessment & Plan:  Well exam.

## 2014-07-29 ENCOUNTER — Telehealth: Payer: Self-pay | Admitting: Family Medicine

## 2014-07-29 NOTE — Telephone Encounter (Signed)
Pt would like to know if you would except her 56 year old son as a patient.Marland Kitchen

## 2014-07-29 NOTE — Telephone Encounter (Signed)
Yes I can see him  ?

## 2014-07-30 NOTE — Telephone Encounter (Signed)
S/w pt and advised her of Dr Sarajane Jews decision

## 2015-06-10 ENCOUNTER — Telehealth: Payer: Self-pay | Admitting: Family Medicine

## 2015-06-10 NOTE — Telephone Encounter (Signed)
Pt would like dr fry to accept her 57 yrs old son. Can I sch?

## 2015-06-10 NOTE — Telephone Encounter (Signed)
Yes I can see her son

## 2015-06-11 NOTE — Telephone Encounter (Signed)
Pt son has been sch

## 2015-06-18 LAB — HM MAMMOGRAPHY

## 2015-06-22 ENCOUNTER — Encounter: Payer: Self-pay | Admitting: Family Medicine

## 2015-07-01 ENCOUNTER — Other Ambulatory Visit (INDEPENDENT_AMBULATORY_CARE_PROVIDER_SITE_OTHER): Payer: Federal, State, Local not specified - PPO

## 2015-07-01 DIAGNOSIS — Z0000 Encounter for general adult medical examination without abnormal findings: Secondary | ICD-10-CM

## 2015-07-01 LAB — CBC WITH DIFFERENTIAL/PLATELET
Basophils Absolute: 0.1 10*3/uL (ref 0.0–0.1)
Basophils Relative: 0.6 % (ref 0.0–3.0)
Eosinophils Absolute: 0.1 10*3/uL (ref 0.0–0.7)
Eosinophils Relative: 1.2 % (ref 0.0–5.0)
HCT: 45.4 % (ref 36.0–46.0)
Hemoglobin: 14.8 g/dL (ref 12.0–15.0)
Lymphocytes Relative: 39.7 % (ref 12.0–46.0)
Lymphs Abs: 3.6 10*3/uL (ref 0.7–4.0)
MCHC: 32.6 g/dL (ref 30.0–36.0)
MCV: 91.2 fl (ref 78.0–100.0)
Monocytes Absolute: 0.6 10*3/uL (ref 0.1–1.0)
Monocytes Relative: 6.1 % (ref 3.0–12.0)
Neutro Abs: 4.7 10*3/uL (ref 1.4–7.7)
Neutrophils Relative %: 52.4 % (ref 43.0–77.0)
Platelets: 244 10*3/uL (ref 150.0–400.0)
RBC: 4.97 Mil/uL (ref 3.87–5.11)
RDW: 14.5 % (ref 11.5–15.5)
WBC: 9 10*3/uL (ref 4.0–10.5)

## 2015-07-01 LAB — POCT URINALYSIS DIPSTICK
BILIRUBIN UA: NEGATIVE
Glucose, UA: NEGATIVE
Ketones, UA: NEGATIVE
LEUKOCYTES UA: NEGATIVE
NITRITE UA: NEGATIVE
PH UA: 5.5
PROTEIN UA: NEGATIVE
RBC UA: NEGATIVE
Spec Grav, UA: >=1.03
UROBILINOGEN UA: 0.2

## 2015-07-01 LAB — BASIC METABOLIC PANEL
BUN: 16 mg/dL (ref 6–23)
CO2: 32 mEq/L (ref 19–32)
Calcium: 9.6 mg/dL (ref 8.4–10.5)
Chloride: 104 mEq/L (ref 96–112)
Creatinine, Ser: 0.81 mg/dL (ref 0.40–1.20)
GFR: 77.43 mL/min (ref >60.00)
Glucose, Bld: 104 mg/dL — ABNORMAL HIGH (ref 70–99)
Potassium: 5 mEq/L (ref 3.5–5.1)
Sodium: 143 mEq/L (ref 135–145)

## 2015-07-01 LAB — LIPID PANEL
CHOL/HDL RATIO: 3
Cholesterol: 155 mg/dL (ref 0–200)
HDL: 49.1 mg/dL (ref >39.00)
LDL Cholesterol: 84 mg/dL (ref 0–99)
NONHDL: 106.35
Triglycerides: 114 mg/dL (ref 0.0–149.0)
VLDL: 22.8 mg/dL (ref 0.0–40.0)

## 2015-07-01 LAB — HEPATIC FUNCTION PANEL
ALT: 18 U/L (ref 0–35)
AST: 17 U/L (ref 0–37)
Albumin: 3.8 g/dL (ref 3.5–5.2)
Alkaline Phosphatase: 66 U/L (ref 39–117)
BILIRUBIN DIRECT: 0.1 mg/dL (ref 0.0–0.3)
BILIRUBIN TOTAL: 0.6 mg/dL (ref 0.2–1.2)
Total Protein: 6.9 g/dL (ref 6.0–8.3)

## 2015-07-01 LAB — TSH: TSH: 1.56 u[IU]/mL (ref 0.35–4.50)

## 2015-07-07 ENCOUNTER — Encounter: Payer: Self-pay | Admitting: Family Medicine

## 2015-07-07 ENCOUNTER — Ambulatory Visit (INDEPENDENT_AMBULATORY_CARE_PROVIDER_SITE_OTHER): Payer: Federal, State, Local not specified - PPO | Admitting: Family Medicine

## 2015-07-07 VITALS — BP 106/86 | HR 86 | Temp 98.5°F | Ht 61.5 in | Wt 188.0 lb

## 2015-07-07 DIAGNOSIS — Z0000 Encounter for general adult medical examination without abnormal findings: Secondary | ICD-10-CM | POA: Diagnosis not present

## 2015-07-07 DIAGNOSIS — G43909 Migraine, unspecified, not intractable, without status migrainosus: Secondary | ICD-10-CM | POA: Insufficient documentation

## 2015-07-07 MED ORDER — SUMATRIPTAN SUCCINATE 100 MG PO TABS: 100.0000 mg | ORAL_TABLET | ORAL | Status: DC | PRN

## 2015-07-07 NOTE — Progress Notes (Signed)
Pre visit review using our clinic review tool, if applicable. No additional management support is needed unless otherwise documented below in the visit note. 

## 2015-07-07 NOTE — Progress Notes (Signed)
   Subjective:    Patient ID: [name], female    DOB: 1955-09-16, 59 y.o.   MRN: [id number]  HPI 59 yr old female for a cpx. She feels well.    Review of Systems  Constitutional: Negative.   HENT: Negative.   Eyes: Negative.   Respiratory: Negative.   Cardiovascular: Negative.   Gastrointestinal: Negative.   Genitourinary: Negative for dysuria, urgency, frequency, hematuria, flank pain, decreased urine volume, enuresis, difficulty urinating, pelvic pain and dyspareunia.  Musculoskeletal: Negative.   Skin: Negative.   Neurological: Negative.   Psychiatric/Behavioral: Negative.        Objective:   Physical Exam  Constitutional: She is oriented to person, place, and time. She appears well-developed and well-nourished. No distress.  HENT:  Head: Normocephalic and atraumatic.  Right Ear: External ear normal.  Left Ear: External ear normal.  Nose: Nose normal.  Mouth/Throat: Oropharynx is clear and moist. No oropharyngeal exudate.  Eyes: Conjunctivae and EOM are normal. Pupils are equal, round, and reactive to light. No scleral icterus.  Neck: Normal range of motion. Neck supple. No JVD present. No thyromegaly present.  Cardiovascular: Normal rate, regular rhythm, normal heart sounds and intact distal pulses.  Exam reveals no gallop and no friction rub.   No murmur heard. EKG normal   Pulmonary/Chest: Effort normal and breath sounds normal. No respiratory distress. She has no wheezes. She has no rales. She exhibits no tenderness.  Abdominal: Soft. Bowel sounds are normal. She exhibits no distension and no mass. There is no tenderness. There is no rebound and no guarding.  Musculoskeletal: Normal range of motion. She exhibits no edema or tenderness.  Lymphadenopathy:    She has no cervical adenopathy.  Neurological: She is alert and oriented to person, place, and time. She has normal reflexes. No cranial nerve deficit. She exhibits normal muscle tone. Coordination normal.   Skin: Skin is warm and dry. No rash noted. No erythema.  Psychiatric: She has a normal mood and affect. Her behavior is normal. Judgment and thought content normal.          Assessment & Plan:  Well exam.  We discussed diet and exercise.

## 2017-01-04 DIAGNOSIS — M9901 Segmental and somatic dysfunction of cervical region: Secondary | ICD-10-CM | POA: Diagnosis not present

## 2017-01-04 DIAGNOSIS — M50322 Other cervical disc degeneration at C5-C6 level: Secondary | ICD-10-CM | POA: Diagnosis not present

## 2017-01-04 DIAGNOSIS — M5136 Other intervertebral disc degeneration, lumbar region: Secondary | ICD-10-CM | POA: Diagnosis not present

## 2017-01-04 DIAGNOSIS — M9903 Segmental and somatic dysfunction of lumbar region: Secondary | ICD-10-CM | POA: Diagnosis not present

## 2017-01-05 DIAGNOSIS — M5136 Other intervertebral disc degeneration, lumbar region: Secondary | ICD-10-CM | POA: Diagnosis not present

## 2017-01-05 DIAGNOSIS — M9903 Segmental and somatic dysfunction of lumbar region: Secondary | ICD-10-CM | POA: Diagnosis not present

## 2017-01-05 DIAGNOSIS — M50322 Other cervical disc degeneration at C5-C6 level: Secondary | ICD-10-CM | POA: Diagnosis not present

## 2017-01-05 DIAGNOSIS — M9901 Segmental and somatic dysfunction of cervical region: Secondary | ICD-10-CM | POA: Diagnosis not present

## 2017-01-13 DIAGNOSIS — M9903 Segmental and somatic dysfunction of lumbar region: Secondary | ICD-10-CM | POA: Diagnosis not present

## 2017-01-13 DIAGNOSIS — M9901 Segmental and somatic dysfunction of cervical region: Secondary | ICD-10-CM | POA: Diagnosis not present

## 2017-01-13 DIAGNOSIS — M50322 Other cervical disc degeneration at C5-C6 level: Secondary | ICD-10-CM | POA: Diagnosis not present

## 2017-01-13 DIAGNOSIS — M5136 Other intervertebral disc degeneration, lumbar region: Secondary | ICD-10-CM | POA: Diagnosis not present

## 2017-01-20 DIAGNOSIS — M5136 Other intervertebral disc degeneration, lumbar region: Secondary | ICD-10-CM | POA: Diagnosis not present

## 2017-01-20 DIAGNOSIS — M50322 Other cervical disc degeneration at C5-C6 level: Secondary | ICD-10-CM | POA: Diagnosis not present

## 2017-01-20 DIAGNOSIS — M9901 Segmental and somatic dysfunction of cervical region: Secondary | ICD-10-CM | POA: Diagnosis not present

## 2017-01-20 DIAGNOSIS — M9903 Segmental and somatic dysfunction of lumbar region: Secondary | ICD-10-CM | POA: Diagnosis not present

## 2017-01-27 DIAGNOSIS — M50322 Other cervical disc degeneration at C5-C6 level: Secondary | ICD-10-CM | POA: Diagnosis not present

## 2017-01-27 DIAGNOSIS — M5136 Other intervertebral disc degeneration, lumbar region: Secondary | ICD-10-CM | POA: Diagnosis not present

## 2017-01-27 DIAGNOSIS — M9901 Segmental and somatic dysfunction of cervical region: Secondary | ICD-10-CM | POA: Diagnosis not present

## 2017-01-27 DIAGNOSIS — M9903 Segmental and somatic dysfunction of lumbar region: Secondary | ICD-10-CM | POA: Diagnosis not present

## 2017-02-07 DIAGNOSIS — M9901 Segmental and somatic dysfunction of cervical region: Secondary | ICD-10-CM | POA: Diagnosis not present

## 2017-02-07 DIAGNOSIS — M9903 Segmental and somatic dysfunction of lumbar region: Secondary | ICD-10-CM | POA: Diagnosis not present

## 2017-02-07 DIAGNOSIS — M50322 Other cervical disc degeneration at C5-C6 level: Secondary | ICD-10-CM | POA: Diagnosis not present

## 2017-02-07 DIAGNOSIS — M5136 Other intervertebral disc degeneration, lumbar region: Secondary | ICD-10-CM | POA: Diagnosis not present

## 2017-02-24 DIAGNOSIS — M9901 Segmental and somatic dysfunction of cervical region: Secondary | ICD-10-CM | POA: Diagnosis not present

## 2017-02-24 DIAGNOSIS — M5136 Other intervertebral disc degeneration, lumbar region: Secondary | ICD-10-CM | POA: Diagnosis not present

## 2017-02-24 DIAGNOSIS — M50322 Other cervical disc degeneration at C5-C6 level: Secondary | ICD-10-CM | POA: Diagnosis not present

## 2017-02-24 DIAGNOSIS — M9903 Segmental and somatic dysfunction of lumbar region: Secondary | ICD-10-CM | POA: Diagnosis not present

## 2017-06-02 ENCOUNTER — Encounter: Payer: Self-pay | Admitting: Family Medicine

## 2017-06-02 DIAGNOSIS — Z1231 Encounter for screening mammogram for malignant neoplasm of breast: Secondary | ICD-10-CM | POA: Diagnosis not present

## 2017-06-13 ENCOUNTER — Ambulatory Visit (INDEPENDENT_AMBULATORY_CARE_PROVIDER_SITE_OTHER): Payer: Federal, State, Local not specified - PPO | Admitting: Family Medicine

## 2017-06-13 ENCOUNTER — Encounter: Payer: Federal, State, Local not specified - PPO | Admitting: Family Medicine

## 2017-06-13 ENCOUNTER — Encounter: Payer: Self-pay | Admitting: Family Medicine

## 2017-06-13 VITALS — BP 112/80 | Temp 97.4°F | Ht 61.5 in | Wt 204.0 lb

## 2017-06-13 DIAGNOSIS — R739 Hyperglycemia, unspecified: Secondary | ICD-10-CM | POA: Diagnosis not present

## 2017-06-13 DIAGNOSIS — Z0000 Encounter for general adult medical examination without abnormal findings: Secondary | ICD-10-CM

## 2017-06-13 DIAGNOSIS — Z23 Encounter for immunization: Secondary | ICD-10-CM

## 2017-06-13 LAB — BASIC METABOLIC PANEL
BUN: 13 mg/dL (ref 6–23)
CALCIUM: 9.3 mg/dL (ref 8.4–10.5)
CO2: 31 meq/L (ref 19–32)
Chloride: 102 mEq/L (ref 96–112)
Creatinine, Ser: 0.86 mg/dL (ref 0.40–1.20)
GFR: 71.76 mL/min (ref >60.00)
Glucose, Bld: 101 mg/dL — ABNORMAL HIGH (ref 70–99)
Potassium: 4 mEq/L (ref 3.5–5.1)
Sodium: 139 mEq/L (ref 135–145)

## 2017-06-13 LAB — HEPATIC FUNCTION PANEL
ALK PHOS: 64 U/L (ref 39–117)
ALT: 34 U/L (ref 0–35)
AST: 35 U/L (ref 0–37)
Albumin: 4.2 g/dL (ref 3.5–5.2)
BILIRUBIN DIRECT: 0.1 mg/dL (ref 0.0–0.3)
Total Bilirubin: 0.8 mg/dL (ref 0.2–1.2)
Total Protein: 6.8 g/dL (ref 6.0–8.3)

## 2017-06-13 LAB — POC URINALSYSI DIPSTICK (AUTOMATED)
BILIRUBIN UA: NEGATIVE
Glucose, UA: NEGATIVE
KETONES UA: NEGATIVE
Leukocytes, UA: NEGATIVE
NITRITE UA: NEGATIVE
PH UA: 6 (ref 5.0–8.0)
Protein, UA: NEGATIVE
RBC UA: NEGATIVE
Spec Grav, UA: >=1.03 — AB (ref 1.010–1.025)
Urobilinogen, UA: 0.2 E.U./dL

## 2017-06-13 LAB — LIPID PANEL
CHOL/HDL RATIO: 4
Cholesterol: 155 mg/dL (ref 0–200)
HDL: 43.7 mg/dL (ref >39.00)
LDL Cholesterol: 85 mg/dL (ref 0–99)
NonHDL: 111.08
TRIGLYCERIDES: 131 mg/dL (ref 0.0–149.0)
VLDL: 26.2 mg/dL (ref 0.0–40.0)

## 2017-06-13 LAB — CBC WITH DIFFERENTIAL/PLATELET
BASOS ABS: 0 10*3/uL (ref 0.0–0.1)
Basophils Relative: 0.7 % (ref 0.0–3.0)
EOS ABS: 0.1 10*3/uL (ref 0.0–0.7)
Eosinophils Relative: 1.9 % (ref 0.0–5.0)
HCT: 44.2 % (ref 36.0–46.0)
Hemoglobin: 14.4 g/dL (ref 12.0–15.0)
LYMPHS ABS: 2.6 10*3/uL (ref 0.7–4.0)
Lymphocytes Relative: 47.6 % — ABNORMAL HIGH (ref 12.0–46.0)
MCHC: 32.5 g/dL (ref 30.0–36.0)
MCV: 90.8 fl (ref 78.0–100.0)
MONOS PCT: 8.5 % (ref 3.0–12.0)
Monocytes Absolute: 0.5 10*3/uL (ref 0.1–1.0)
NEUTROS ABS: 2.3 10*3/uL (ref 1.4–7.7)
Neutrophils Relative %: 41.3 % — ABNORMAL LOW (ref 43.0–77.0)
PLATELETS: 224 10*3/uL (ref 150.0–400.0)
RBC: 4.87 Mil/uL (ref 3.87–5.11)
RDW: 14.2 % (ref 11.5–15.5)
WBC: 5.5 10*3/uL (ref 4.0–10.5)

## 2017-06-13 LAB — HEMOGLOBIN A1C: Hgb A1c MFr Bld: 6.1 % (ref 4.6–6.5)

## 2017-06-13 LAB — TSH: TSH: 3.08 u[IU]/mL (ref 0.35–4.50)

## 2017-06-13 NOTE — Progress Notes (Signed)
   Subjective:    Patient ID: [name], female    DOB: [birth year], 61 y.o.   MRN: [id number]  HPI Here for a well exam. She is doing well. She still sees the [facility] for chronic neck pain.    Review of Systems  Constitutional: Negative.   HENT: Negative.   Eyes: Negative.   Respiratory: Negative.   Cardiovascular: Negative.   Gastrointestinal: Negative.   Genitourinary: Negative for decreased urine volume, difficulty urinating, dyspareunia, dysuria, enuresis, flank pain, frequency, hematuria, pelvic pain and urgency.  Musculoskeletal: Negative.   Skin: Negative.   Neurological: Negative.   Psychiatric/Behavioral: Negative.        Objective:   Physical Exam  Constitutional: She is oriented to person, place, and time. She appears well-developed and well-nourished. No distress.  HENT:  Head: Normocephalic and atraumatic.  Right Ear: External ear normal.  Left Ear: External ear normal.  Nose: Nose normal.  Mouth/Throat: Oropharynx is clear and moist. No oropharyngeal exudate.  Eyes: Pupils are equal, round, and reactive to light. Conjunctivae and EOM are normal. No scleral icterus.  Neck: Normal range of motion. Neck supple. No JVD present. No thyromegaly present.  Cardiovascular: Normal rate, regular rhythm, normal heart sounds and intact distal pulses.  Exam reveals no gallop and no friction rub.   No murmur heard. Pulmonary/Chest: Effort normal and breath sounds normal. No respiratory distress. She has no wheezes. She has no rales. She exhibits no tenderness.  Abdominal: Soft. Bowel sounds are normal. She exhibits no distension and no mass. There is no tenderness. There is no rebound and no guarding.  Musculoskeletal: Normal range of motion. She exhibits no edema or tenderness.  Lymphadenopathy:    She has no cervical adenopathy.  Neurological: She is alert and oriented to person, place, and time. She has normal reflexes. No cranial nerve deficit. She  exhibits normal muscle tone. Coordination normal.  Skin: Skin is warm and dry. No rash noted. No erythema.  Psychiatric: She has a normal mood and affect. Her behavior is normal. Judgment and thought content normal.          Assessment & Plan:  Well exam. We discussed diet and exercise. Get fasting labs. Alysia Penna, MD

## 2017-06-13 NOTE — Patient Instructions (Signed)
WE NOW OFFER   [name] FAST TRACK!!!  SAME DAY Appointments for ACUTE CARE  Such as: Sprains, Injuries, cuts, abrasions, rashes, muscle pain, joint pain, back pain Colds, flu, sore throats, headache, allergies, cough, fever  Ear pain, sinus and eye infections Abdominal pain, nausea, vomiting, diarrhea, upset stomach Animal/insect bites  3 Easy Ways to Schedule: Walk-In Scheduling Call in scheduling Mychart Sign-up: [name]         

## 2017-06-14 ENCOUNTER — Encounter: Payer: Federal, State, Local not specified - PPO | Admitting: Family Medicine

## 2018-03-26 DIAGNOSIS — H1013 Acute atopic conjunctivitis, bilateral: Secondary | ICD-10-CM | POA: Diagnosis not present

## 2018-03-26 DIAGNOSIS — J209 Acute bronchitis, unspecified: Secondary | ICD-10-CM | POA: Diagnosis not present

## 2018-04-04 ENCOUNTER — Ambulatory Visit: Payer: Federal, State, Local not specified - PPO | Admitting: Internal Medicine

## 2018-04-04 ENCOUNTER — Encounter: Payer: Self-pay | Admitting: Internal Medicine

## 2018-04-04 VITALS — BP 122/62 | HR 109 | Temp 98.6°F | Wt 197.4 lb

## 2018-04-04 DIAGNOSIS — J069 Acute upper respiratory infection, unspecified: Secondary | ICD-10-CM

## 2018-04-04 DIAGNOSIS — B9789 Other viral agents as the cause of diseases classified elsewhere: Secondary | ICD-10-CM | POA: Diagnosis not present

## 2018-04-04 NOTE — Progress Notes (Signed)
Subjective:    Patient ID: [name], female    DOB: 05/16/1956, 62 y.o.   MRN: [id number]  HPI  62 year old patient who is seen today for follow-up. She has been ill since Friday, July 12 and was seen earlier at [facility] urgent care with a diagnosis of acute bronchitis.  She was initially treated with a azithromycin and after failure to improve was retreated with Augmentin. The patient is seen today complaining of persistent cough congestion intermittent wheezing.  She describes occasional low-grade fever. She does have a history of aspiration pneumonia associated with perioperative complications that required tracheostomy and long-term ventilator care followed by 3 weeks of rehab.  Past Medical History:  Diagnosis Date  . Anxiety   . Aspiration pneumonia ([place])   . 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 Chronic neck pain    sees Dr. Debarah Crape and Dondra Prader NP at [facility] in [place]   . Depression   . Difficult intubation   . GERD (gastroesophageal reflux disease)   . Headache(784.0)   . Hiatal hernia   . Ovarian cyst   . Pneumonia    hx x4  . PONV (postoperative nausea and vomiting) 08/06/11  . Post traumatic stress disorder (PTSD)   . Respiratory failure ([place])      Social History   Socioeconomic History  . Marital status: Widowed    Spouse name: Not on file  . Number of children: Not on file  . Years of education: Not on file  . Highest education level: Not on file  Occupational History  . Not on file  Social Needs  . Financial resource strain: Not on file  . Food insecurity:    Worry: Not on file    Inability: Not on file  . Transportation needs:    Medical: Not on file    Non-medical: Not on file  Tobacco Use  . Smoking status: Never Smoker  . Smokeless tobacco: Never Used  Substance and Sexual Activity  . Alcohol use: Yes    Alcohol/week: 0.0 oz    Comment: occ  . Drug use: No  . Sexual activity: Yes    Birth  control/protection: Post-menopausal  Lifestyle  . Physical activity:    Days per week: Not on file    Minutes per session: Not on file  . Stress: Not on file  Relationships  . Social connections:    Talks on phone: Not on file    Gets together: Not on file    Attends religious service: Not on file    Active member of club or organization: Not on file    Attends meetings of clubs or organizations: Not on file    Relationship status: Not on file  . Intimate partner violence:    Fear of current or ex partner: Not on file    Emotionally abused: Not on file    Physically abused: Not on file    Forced sexual activity: Not on file  Other Topics Concern  . Not on file  Social History Narrative  . Not on file    Past Surgical History:  Procedure Laterality Date  . ABDOMINAL HYSTERECTOMY  88  . CESAREAN SECTION  87  . COLONOSCOPY  Dec. 2014    per Dr. Cristina Gong, 2 benign polyps, repeat in 10 yrs   . DIRECT LARYNGOSCOPY  08/19/2011   Procedure: DIRECT LARYNGOSCOPY;  Surgeon: Onnie Graham;  Location: [facility];  Service: ENT;  Laterality:  N/A;  . KNEE SURGERY  14   rt arthroscopy  . ORIF ELBOW FRACTURE  08/06/2011   Procedure: OPEN REDUCTION INTERNAL FIXATION (ORIF) ELBOW/OLECRANON FRACTURE;  Surgeon: Linna Hoff;  Location: [facility];  Service: Orthopedics;  Laterality: Left;  . OVARIAN CYST REMOVAL  80  . TRACHEOSTOMY TUBE PLACEMENT  08/19/2011   Procedure: TRACHEOSTOMY;  Surgeon: Onnie Graham;  Location: [facility];  Service: ENT;  Laterality: N/A;  tracheoscopy    Family History  Problem Relation Age of Onset  . Breast cancer Mother   . Breast cancer Maternal Aunt     Allergies  Allergen Reactions  . Demerol   . Hydrocodone     Severe itching  . Hydromorphone     Respiratory distress  . Meperidine Hcl Itching    REACTION: hives, itich    Current Outpatient Medications on File Prior to Visit  Medication Sig Dispense Refill  . HYDROcodone-acetaminophen (NORCO) 10-325 MG  tablet Take 1 tablet by mouth every 6 (six) hours as needed.    . SUMAtriptan (IMITREX) 100 MG tablet Take 1 tablet (100 mg total) by mouth as needed for migraine. May repeat in 2 hours if headache persists or recurs. 10 tablet 11  . traZODone (DESYREL) 50 MG tablet Take 50 mg by mouth at bedtime.     No current facility-administered medications on file prior to visit.     BP 122/62 (BP Location: Left Arm, Patient Position: Sitting, Cuff Size: Normal)   Pulse (!) 109   Temp 98.6 F (37 C) (Oral)   Wt 197 lb 6.4 oz (89.5 kg)   SpO2 97%   BMI 36.69 kg/m     Review of Systems  Constitutional: Positive for activity change, appetite change, fatigue and fever.  HENT: Negative for congestion, dental problem, hearing loss, rhinorrhea, sinus pressure, sore throat and tinnitus.   Eyes: Negative for pain, discharge and visual disturbance.  Respiratory: Positive for cough. Negative for shortness of breath.   Cardiovascular: Negative for chest pain, palpitations and leg swelling.  Gastrointestinal: Negative for abdominal distention, abdominal pain, blood in stool, constipation, diarrhea, nausea and vomiting.  Genitourinary: Negative for difficulty urinating, dysuria, flank pain, frequency, hematuria, pelvic pain, urgency, vaginal bleeding, vaginal discharge and vaginal pain.  Musculoskeletal: Negative for arthralgias, gait problem and joint swelling.  Skin: Negative for rash.  Neurological: Negative for dizziness, syncope, speech difficulty, weakness, numbness and headaches.  Hematological: Negative for adenopathy.  Psychiatric/Behavioral: Negative for agitation, behavioral problems and dysphoric mood. The patient is not nervous/anxious.        Objective:   Physical Exam  Constitutional: She is oriented to person, place, and time. She appears well-developed and well-nourished.  HENT:  Head: Normocephalic.  Right Ear: External ear normal.  Left Ear: External ear normal.  Mouth/Throat:  Oropharynx is clear and moist.  Eyes: Pupils are equal, round, and reactive to light. Conjunctivae and EOM are normal.  Neck: Normal range of motion. Neck supple. No thyromegaly present.  Cardiovascular: Normal rate, regular rhythm, normal heart sounds and intact distal pulses.  Pulmonary/Chest: Effort normal and breath sounds normal. No stridor. No respiratory distress. She has no wheezes. She has no rales.  Abdominal: Soft. Bowel sounds are normal. She exhibits no mass. There is no tenderness.  Musculoskeletal: Normal range of motion.  Lymphadenopathy:    She has no cervical adenopathy.  Neurological: She is alert and oriented to person, place, and time.  Skin: Skin is warm and dry. No rash noted.  Psychiatric: She has a normal mood and affect. Her behavior is normal.          Assessment & Plan:  Slowly resolving bronchitis.  Natural history of the disease discussed with patient.  Likely viral syndrome but will complete antibiotic since this has been started.  Will liberalize fluid intake treat with antitussives and rest  Marletta Lor

## 2018-04-04 NOTE — Patient Instructions (Addendum)
Acute bronchitis symptoms are generally not helped by antibiotics.  Take over-the-counter expectorants and cough medications such as  Mucinex DM.  Call if there is no improvement in 5 to 7 days or if  you develop worsening cough, fever, or new symptoms, such as shortness of breath or chest pain.  Hydrate and Humidify  Drink enough water to keep your urine clear or pale yellow. Staying hydrated will help to thin your mucus.  Use a cool mist humidifier to keep the humidity level in your home above 50%.  Inhale steam for 10-15 minutes, 3-4 times a day or as told by your health care provider. You can do this in the bathroom while a hot shower is running.  Limit your exposure to cool or dry air. Rest  Rest as much as possible.  

## 2018-07-20 DIAGNOSIS — M5136 Other intervertebral disc degeneration, lumbar region: Secondary | ICD-10-CM | POA: Diagnosis not present

## 2018-07-20 DIAGNOSIS — M25561 Pain in right knee: Secondary | ICD-10-CM | POA: Diagnosis not present

## 2018-07-20 DIAGNOSIS — M545 Low back pain: Secondary | ICD-10-CM | POA: Diagnosis not present

## 2018-07-28 DIAGNOSIS — M25561 Pain in right knee: Secondary | ICD-10-CM | POA: Diagnosis not present

## 2018-07-28 DIAGNOSIS — M545 Low back pain: Secondary | ICD-10-CM | POA: Diagnosis not present

## 2018-08-02 DIAGNOSIS — M4316 Spondylolisthesis, lumbar region: Secondary | ICD-10-CM | POA: Diagnosis not present

## 2018-08-02 DIAGNOSIS — M545 Low back pain: Secondary | ICD-10-CM | POA: Diagnosis not present

## 2018-08-02 DIAGNOSIS — M25561 Pain in right knee: Secondary | ICD-10-CM | POA: Diagnosis not present

## 2018-08-02 DIAGNOSIS — M25361 Other instability, right knee: Secondary | ICD-10-CM | POA: Diagnosis not present

## 2018-08-02 DIAGNOSIS — M5136 Other intervertebral disc degeneration, lumbar region: Secondary | ICD-10-CM | POA: Diagnosis not present

## 2018-11-07 DIAGNOSIS — M25561 Pain in right knee: Secondary | ICD-10-CM | POA: Diagnosis not present

## 2018-11-07 DIAGNOSIS — S8001XD Contusion of right knee, subsequent encounter: Secondary | ICD-10-CM | POA: Diagnosis not present

## 2018-11-07 DIAGNOSIS — M1711 Unilateral primary osteoarthritis, right knee: Secondary | ICD-10-CM | POA: Diagnosis not present

## 2018-11-15 DIAGNOSIS — S8001XA Contusion of right knee, initial encounter: Secondary | ICD-10-CM | POA: Diagnosis not present

## 2018-11-15 DIAGNOSIS — M84361A Stress fracture, right tibia, initial encounter for fracture: Secondary | ICD-10-CM | POA: Diagnosis not present

## 2018-11-15 DIAGNOSIS — M25569 Pain in unspecified knee: Secondary | ICD-10-CM | POA: Diagnosis not present

## 2018-11-16 ENCOUNTER — Encounter: Payer: Self-pay | Admitting: Family Medicine

## 2018-11-16 DIAGNOSIS — Z1231 Encounter for screening mammogram for malignant neoplasm of breast: Secondary | ICD-10-CM | POA: Diagnosis not present

## 2018-11-16 DIAGNOSIS — Z803 Family history of malignant neoplasm of breast: Secondary | ICD-10-CM | POA: Diagnosis not present

## 2018-11-21 ENCOUNTER — Other Ambulatory Visit: Payer: Self-pay

## 2018-11-21 ENCOUNTER — Encounter: Payer: Self-pay | Admitting: Family Medicine

## 2018-11-21 ENCOUNTER — Ambulatory Visit (INDEPENDENT_AMBULATORY_CARE_PROVIDER_SITE_OTHER): Payer: Federal, State, Local not specified - PPO | Admitting: Family Medicine

## 2018-11-21 VITALS — BP 118/82 | HR 84 | Temp 97.9°F | Ht 62.0 in | Wt 202.5 lb

## 2018-11-21 DIAGNOSIS — Z0000 Encounter for general adult medical examination without abnormal findings: Secondary | ICD-10-CM | POA: Diagnosis not present

## 2018-11-21 DIAGNOSIS — R739 Hyperglycemia, unspecified: Secondary | ICD-10-CM | POA: Diagnosis not present

## 2018-11-21 LAB — CBC WITH DIFFERENTIAL/PLATELET
BASOS PCT: 0.4 % (ref 0.0–3.0)
Basophils Absolute: 0 10*3/uL (ref 0.0–0.1)
EOS ABS: 0.1 10*3/uL (ref 0.0–0.7)
Eosinophils Relative: 1.3 % (ref 0.0–5.0)
HCT: 46.4 % — ABNORMAL HIGH (ref 36.0–46.0)
Hemoglobin: 15.6 g/dL — ABNORMAL HIGH (ref 12.0–15.0)
Lymphocytes Relative: 42.1 % (ref 12.0–46.0)
Lymphs Abs: 2.9 10*3/uL (ref 0.7–4.0)
MCHC: 33.7 g/dL (ref 30.0–36.0)
MCV: 91.4 fl (ref 78.0–100.0)
Monocytes Absolute: 0.5 10*3/uL (ref 0.1–1.0)
Monocytes Relative: 7.6 % (ref 3.0–12.0)
Neutro Abs: 3.4 10*3/uL (ref 1.4–7.7)
Neutrophils Relative %: 48.6 % (ref 43.0–77.0)
Platelets: 229 10*3/uL (ref 150.0–400.0)
RBC: 5.08 Mil/uL (ref 3.87–5.11)
RDW: 14.4 % (ref 11.5–15.5)
WBC: 7 10*3/uL (ref 4.0–10.5)

## 2018-11-21 LAB — LIPID PANEL
CHOLESTEROL: 159 mg/dL (ref 0–200)
HDL: 51 mg/dL (ref >39.00)
LDL Cholesterol: 84 mg/dL (ref 0–99)
NonHDL: 107.66
Total CHOL/HDL Ratio: 3
Triglycerides: 118 mg/dL (ref 0.0–149.0)
VLDL: 23.6 mg/dL (ref 0.0–40.0)

## 2018-11-21 LAB — BASIC METABOLIC PANEL
BUN: 12 mg/dL (ref 6–23)
CO2: 29 meq/L (ref 19–32)
Calcium: 9.5 mg/dL (ref 8.4–10.5)
Chloride: 101 mEq/L (ref 96–112)
Creatinine, Ser: 0.87 mg/dL (ref 0.40–1.20)
GFR: 66.3 mL/min (ref >60.00)
GLUCOSE: 104 mg/dL — AB (ref 70–99)
POTASSIUM: 4.2 meq/L (ref 3.5–5.1)
SODIUM: 138 meq/L (ref 135–145)

## 2018-11-21 LAB — HEPATIC FUNCTION PANEL
ALBUMIN: 4.3 g/dL (ref 3.5–5.2)
ALK PHOS: 66 U/L (ref 39–117)
ALT: 25 U/L (ref 0–35)
AST: 28 U/L (ref 0–37)
Bilirubin, Direct: 0.2 mg/dL (ref 0.0–0.3)
TOTAL PROTEIN: 6.7 g/dL (ref 6.0–8.3)
Total Bilirubin: 1 mg/dL (ref 0.2–1.2)

## 2018-11-21 LAB — POC URINALSYSI DIPSTICK (AUTOMATED)
Glucose, UA: NEGATIVE
Ketones, UA: NEGATIVE
Leukocytes, UA: NEGATIVE
Nitrite, UA: NEGATIVE
Protein, UA: POSITIVE — AB
RBC UA: NEGATIVE
Spec Grav, UA: >=1.03 — AB (ref 1.010–1.025)
Urobilinogen, UA: 0.2 E.U./dL
pH, UA: 5 (ref 5.0–8.0)

## 2018-11-21 LAB — TSH: TSH: 3.06 u[IU]/mL (ref 0.35–4.50)

## 2018-11-21 LAB — HEMOGLOBIN A1C: Hgb A1c MFr Bld: 6 % (ref 4.6–6.5)

## 2018-11-21 MED ORDER — OLOPATADINE HCL 0.2 % OP SOLN: 1.0000 [drp] | Freq: Every day | OPHTHALMIC | 3 refills | Status: DC

## 2018-11-21 NOTE — Progress Notes (Signed)
Subjective:    Patient ID: [name], female    DOB: May 01, 1956, 63 y.o.   MRN: [id number]  HPI Here for a well exam and for presurgical clearance. She had been doing well in general until she sustained a contusion injury to the right knee on 08-02-18. She has been seeing Dr. Rolene Arbour since then but only recently was this recognized to be a tibial stress fracture. She is now planning on having an arthroscopic repair of this fracture some time soon per Dr. Victorino December. She continues to see the [facility] for treatment of chronic lumbar pain. She is taking Belbuca for this. She recently had a normal mammogram. Her next colonoscopy is due in 2024.    Review of Systems  Constitutional: Negative.   HENT: Negative.   Eyes: Negative.   Respiratory: Negative.   Cardiovascular: Negative.   Gastrointestinal: Negative.   Genitourinary: Negative for decreased urine volume, difficulty urinating, dyspareunia, dysuria, enuresis, flank pain, frequency, hematuria, pelvic pain and urgency.  Musculoskeletal: Positive for arthralgias and back pain.  Skin: Negative.   Neurological: Negative.   Psychiatric/Behavioral: Negative.        Objective:   Physical Exam Constitutional:      General: She is not in acute distress.    Appearance: She is well-developed.  HENT:     Head: Normocephalic and atraumatic.     Right Ear: External ear normal.     Left Ear: External ear normal.     Nose: Nose normal.     Mouth/Throat:     Pharynx: No oropharyngeal exudate.  Eyes:     General: No scleral icterus.    Conjunctiva/sclera: Conjunctivae normal.     Pupils: Pupils are equal, round, and reactive to light.  Neck:     Musculoskeletal: Normal range of motion and neck supple.     Thyroid: No thyromegaly.     Vascular: No JVD.  Cardiovascular:     Rate and Rhythm: Normal rate and regular rhythm.     Heart sounds: Normal heart sounds. No murmur. No friction rub. No gallop.    Comments: EKG today is normal  Pulmonary:     Effort: Pulmonary effort is normal. No respiratory distress.     Breath sounds: Normal breath sounds. No wheezing or rales.  Chest:     Chest wall: No tenderness.  Abdominal:     General: Bowel sounds are normal. There is no distension.     Palpations: Abdomen is soft. There is no mass.     Tenderness: There is no abdominal tenderness. There is no guarding or rebound.  Musculoskeletal: Normal range of motion.        General: No tenderness.  Lymphadenopathy:     Cervical: No cervical adenopathy.  Skin:    General: Skin is warm and dry.     Findings: No erythema or rash.  Neurological:     Mental Status: She is alert and oriented to person, place, and time.     Cranial Nerves: No cranial nerve deficit.     Motor: No abnormal muscle tone.     Coordination: Coordination normal.     Deep Tendon Reflexes: Reflexes are normal and symmetric. Reflexes normal.  Psychiatric:        Behavior: Behavior normal.        Thought Content: Thought content normal.        Judgment: Judgment normal.           Assessment & Plan:  Well exam. We discussed diet and exercise. Get fasting labs today. Pending on the return of her lab results, we will evaluate for surgical clearance.  Alysia Penna, MD

## 2018-11-22 ENCOUNTER — Encounter: Payer: Self-pay | Admitting: *Deleted

## 2018-11-23 ENCOUNTER — Telehealth: Payer: Self-pay | Admitting: *Deleted

## 2018-11-23 DIAGNOSIS — M810 Age-related osteoporosis without current pathological fracture: Secondary | ICD-10-CM

## 2018-11-23 NOTE — Telephone Encounter (Signed)
Copied from [facility] #[id number]. Topic: General - Inquiry >> Nov 22, 2018  4:53 PM Reyne Dumas L wrote: Reason for CRM:   Pt calling to find out if lab results are back.  Pt wants to know if they are back and if she can come by the office to pick up a copy.  Pt states that the lab results were going to base Dr. Barbie Banner decision on whether or not she could have surgery so she wants to know about too. Pt can be reached at [phone]   Notes recorded by Laurey Morale, MD on 11/22/2018 at 8:10 AM EDT Normal   Called and spoke with pt and she stated that she wants to go to [facility] for her bone density.  Dr. Sarajane Jews please advise. Thanks

## 2018-11-23 NOTE — Telephone Encounter (Signed)
See my Result Note. The surgical from is ready. The DEXA was ordered

## 2019-03-26 ENCOUNTER — Encounter (HOSPITAL_COMMUNITY)
Admission: RE | Admit: 2019-03-26 | Discharge: 2019-03-26 | Disposition: A | Discharge status: Home / Self Care | Payer: Federal, State, Local not specified - PPO | Source: Ambulatory Visit | Attending: Orthopedic Surgery | Admitting: Orthopedic Surgery

## 2019-03-26 ENCOUNTER — Other Ambulatory Visit: Payer: Self-pay

## 2019-03-26 ENCOUNTER — Encounter (HOSPITAL_COMMUNITY): Payer: Self-pay

## 2019-03-26 DIAGNOSIS — Z01812 Encounter for preprocedural laboratory examination: Secondary | ICD-10-CM | POA: Insufficient documentation

## 2019-03-26 HISTORY — DX: Prediabetes: R73.03

## 2019-03-26 HISTORY — DX: Bronchitis, not specified as acute or chronic: J40

## 2019-03-26 HISTORY — DX: Unspecified osteoarthritis, unspecified site: M19.90

## 2019-03-26 LAB — CBC
HCT: 46.7 % — ABNORMAL HIGH (ref 36.0–46.0)
Hemoglobin: 15.2 g/dL — ABNORMAL HIGH (ref 12.0–15.0)
MCH: 30.5 pg (ref 26.0–34.0)
MCHC: 32.5 g/dL (ref 30.0–36.0)
MCV: 93.8 fL (ref 80.0–100.0)
Platelets: 260 10*3/uL (ref 150–400)
RBC: 4.98 MIL/uL (ref 3.87–5.11)
RDW: 13.2 % (ref 11.5–15.5)
WBC: 5.3 10*3/uL (ref 4.0–10.5)
nRBC: 0 % (ref 0.0–0.2)

## 2019-03-26 LAB — GLUCOSE, CAPILLARY: Glucose-Capillary: 119 mg/dL — ABNORMAL HIGH (ref 70–99)

## 2019-03-26 NOTE — Progress Notes (Signed)
PCP - Dr. Barbie Banner  Cardiologist - Denies  Chest x-ray - Denies  EKG - 11/21/2018 (E)  Stress Test - Denies  ECHO - Denies  Cardiac Cath - Denies  AICD-na PM-na LOOP-na  Sleep Study - Denies CPAP - Denies  LABS- 03/26/2019: CBC, BMP 03/29/2019: COVID  ASA-Denies  ERAS-Yes- 1 drink given   Anesthesia- No  Pt denies having chest pain, sob, or fever at this time. All instructions explained to the pt, with a verbal understanding of the material. Pt agrees to go over the instructions while at home for a better understanding. Pt also instructed to self quarantine after being tested for COVID-19. The opportunity to ask questions was provided.   Coronavirus Screening  Have you experienced the following symptoms:  Cough yes/no: No Fever (>100.90F)  yes/no: No Runny nose yes/no: No Sore throat yes/no: No Difficulty breathing/shortness of breath  yes/no: No  Have you or a family member traveled in the last 14 days and where? yes/no: No   If the patient indicates "YES" to the above questions, their PAT will be rescheduled to limit the exposure to others and, the surgeon will be notified. THE PATIENT WILL NEED TO BE ASYMPTOMATIC FOR 14 DAYS.   If the patient is not experiencing any of these symptoms, the PAT nurse will instruct them to NOT bring anyone with them to their appointment since they may have these symptoms or traveled as well.   Please remind your patients and families that hospital visitation restrictions are in effect and the importance of the restrictions.

## 2019-03-26 NOTE — Pre-Procedure Instructions (Signed)
[name]  03/26/2019    [id number] - [place], [place] [address] AT [address] [place] [address] Phone: [phone] Fax: [phone]   Your procedure is scheduled on Tuesday, April 02, 2019.  Report to [facility]mitting at 10:45 A.M.  Call this number if you have problems the morning of surgery:  [phone]   Remember: Brush your teeth the morning of surgery with your regular toothpaste.  Do not eat after midnight Monday, April 01, 2019  You may drink clear liquids until 9:45 A.M. .  Clear liquids allowed are:                    Water, Juice (non-citric and without pulp), Carbonated beverages, Clear Tea, Black Coffee only, Plain Jell-O only, Gatorade and Plain Popsicles only Please finish your Ensure Pre-Surgery drink by 9:45 A.M. (Do not sip )   Take these medicines the morning of surgery with A SIP OF WATER: diphenhydrAMINE (BENADRYL) and HYDROcodone-acetaminophen (NORCO) If needed:SUMAtriptan (IMITREX) for migraine headache If needed:Olopatadine eye drops for eye irritation  Stop taking Aspirin (unless otherwise advised by surgeon), vitamins, fish oil and herbal medications. Do not take any NSAIDs ie: Ibuprofen, Advil, Naproxen (Aleve), Motrin, BC and Goody Powder; stop now.    Do not wear jewelry, make-up or nail polish.  Do not wear lotions, powders, or perfumes, or deodorant.  Do not shave 48 hours prior to surgery.    Do not bring valuables to the hospital.  [facility] is not responsible for any belongings or valuables.  Contacts, dentures or bridgework may not be worn into surgery.  Patients discharged the day of surgery will not be allowed to drive home.  Special instructions: Shower the night before and morning of surgery with CHG. Please read over the following fact sheets that you were given. Pain Booklet, Coughing and Deep Breathing and Surgical Site Infection  Prevention

## 2019-03-26 NOTE — Progress Notes (Signed)
[id number] - [place], [place] [address] AT [address] [place] [address] Phone: [phone] Fax: [phone]             Your procedure is scheduled on Tues., April 02, 2019 from 12:45PM-2:00PM            Report to [facility] Entrance "A" at 10:45 A.M.            Call this number if you have problems the morning of surgery:            [phone]             Remember:             Do not eat after midnight on July 20th            You may drink clear liquids until 3 hours (9:45AM) prior to surgery time .  Clear liquids allowed are: Water, Juice (non-citric and without pulp), Carbonated beverages, Clear Tea, Black Coffee only, Plain Jell-O only, Gatorade and Plain Popsicles only  Please finish your Ensure Pre-Surgery drink by (9:45AM) (Do not sip )             Take these medicines the morning of surgery with A SIP OF WATER:  DiphenhydrAMINE (BENADRYL) and HYDROcodone-acetaminophen (NORCO)  If needed:SUMAtriptan (IMITREX) and Olopatadine eye drops   As of today, stop taking Aspirin (unless otherwise advised by surgeon), vitamins, fish oil and herbal medications. Do not take any NSAIDs ie: Ibuprofen, Advil, Naproxen (Aleve), Motrin, BC and Goody Powder.   [facility]- Preparing For Surgery  Before surgery, you can play an important role. Because skin is not sterile, your skin needs to be as free of germs as possible. You can reduce the number of germs on your skin by washing with CHG (chlorahexidine gluconate) Soap before surgery.  CHG is an antiseptic cleaner which kills germs and bonds with the skin to continue killing germs even after washing.    Please do not use if you have an allergy to CHG or antibacterial soaps. If your skin becomes reddened/irritated stop using the CHG.  Do not shave (including legs and underarms) for at least 48 hours prior to first CHG shower. It is OK to shave your face.  Please follow  these instructions carefully.   1. Shower the NIGHT BEFORE SURGERY and the MORNING OF SURGERY with CHG.   2. If you chose to wash your hair, wash your hair first as usual with your normal shampoo.  3. After you shampoo, rinse your hair and body thoroughly to remove the shampoo.  4. Use CHG as you would any other liquid soap. You can apply CHG directly to the skin and wash gently with a scrungie or a clean washcloth.   5. Apply the CHG Soap to your body ONLY FROM THE NECK DOWN.  Do not use on open wounds or open sores. Avoid contact with your eyes, ears, mouth and genitals (private parts). Wash Face and genitals (private parts)  with your normal soap.  6. Wash thoroughly, paying special attention to the area where your surgery will be performed.  7. Thoroughly rinse your body with warm water from the neck down.  8. DO NOT shower/wash with your normal soap after using and rinsing off the CHG Soap.  9. Pat yourself dry with a CLEAN TOWEL.  10. Wear CLEAN PAJAMAS to bed the night before surgery, wear comfortable clothes the morning of  surgery  11. Place CLEAN SHEETS on your bed the night of your first shower and DO NOT SLEEP WITH PETS.  Day of Surgery:   Remember to brush your teeth WITH YOUR REGULAR TOOTHPASTE.             Do not wear jewelry, make-up or nail polish.            Do not wear lotions, powders, or perfumes, or deodorant.            Do not shave 48 hours prior to surgery.              Do not bring valuables to the hospital.            [facility] is not responsible for any belongings or valuables.  Contacts, dentures or bridgework may not be worn into surgery.   Patients discharged the day of surgery will not be allowed to drive home.  Please wear clean clothes to the hospital/surgery center.   Please read over the following fact sheets that you were given. Pain Booklet, Coughing and Deep Breathing and Surgical Site Infection Prevention

## 2019-03-29 ENCOUNTER — Other Ambulatory Visit (HOSPITAL_COMMUNITY)
Admission: RE | Admit: 2019-03-29 | Discharge: 2019-03-29 | Disposition: A | Discharge status: Home / Self Care | Payer: Federal, State, Local not specified - PPO | Source: Ambulatory Visit | Attending: Orthopedic Surgery | Admitting: Orthopedic Surgery

## 2019-03-29 DIAGNOSIS — Z1159 Encounter for screening for other viral diseases: Secondary | ICD-10-CM | POA: Diagnosis not present

## 2019-03-29 LAB — SARS CORONAVIRUS 2 (TAT 6-24 HRS): SARS Coronavirus 2: NEGATIVE

## 2019-04-02 ENCOUNTER — Ambulatory Visit (HOSPITAL_COMMUNITY): Payer: Federal, State, Local not specified - PPO | Admitting: Anesthesiology

## 2019-04-02 ENCOUNTER — Encounter (HOSPITAL_COMMUNITY)
Admission: RE | Disposition: A | Discharge status: Home / Self Care | Payer: Self-pay | Source: Home / Self Care | Attending: Orthopedic Surgery

## 2019-04-02 ENCOUNTER — Ambulatory Visit (HOSPITAL_COMMUNITY): Payer: Federal, State, Local not specified - PPO

## 2019-04-02 ENCOUNTER — Ambulatory Visit (HOSPITAL_COMMUNITY)
Admission: RE | Admit: 2019-04-02 | Discharge: 2019-04-02 | Disposition: A | Discharge status: Home / Self Care | Payer: Federal, State, Local not specified - PPO | Attending: Orthopedic Surgery | Admitting: Orthopedic Surgery

## 2019-04-02 ENCOUNTER — Other Ambulatory Visit: Payer: Self-pay

## 2019-04-02 ENCOUNTER — Encounter (HOSPITAL_COMMUNITY): Payer: Self-pay | Admitting: Anesthesiology

## 2019-04-02 DIAGNOSIS — Z4789 Encounter for other orthopedic aftercare: Secondary | ICD-10-CM | POA: Diagnosis not present

## 2019-04-02 DIAGNOSIS — R7303 Prediabetes: Secondary | ICD-10-CM | POA: Insufficient documentation

## 2019-04-02 DIAGNOSIS — Z885 Allergy status to narcotic agent status: Secondary | ICD-10-CM | POA: Insufficient documentation

## 2019-04-02 DIAGNOSIS — G43909 Migraine, unspecified, not intractable, without status migrainosus: Secondary | ICD-10-CM | POA: Diagnosis not present

## 2019-04-02 DIAGNOSIS — S83281A Other tear of lateral meniscus, current injury, right knee, initial encounter: Secondary | ICD-10-CM | POA: Diagnosis present

## 2019-04-02 DIAGNOSIS — M84361A Stress fracture, right tibia, initial encounter for fracture: Secondary | ICD-10-CM | POA: Insufficient documentation

## 2019-04-02 DIAGNOSIS — Z419 Encounter for procedure for purposes other than remedying health state, unspecified: Secondary | ICD-10-CM

## 2019-04-02 DIAGNOSIS — G8929 Other chronic pain: Secondary | ICD-10-CM | POA: Insufficient documentation

## 2019-04-02 DIAGNOSIS — F418 Other specified anxiety disorders: Secondary | ICD-10-CM | POA: Diagnosis not present

## 2019-04-02 DIAGNOSIS — Z79891 Long term (current) use of opiate analgesic: Secondary | ICD-10-CM | POA: Diagnosis not present

## 2019-04-02 DIAGNOSIS — M1990 Unspecified osteoarthritis, unspecified site: Secondary | ICD-10-CM | POA: Insufficient documentation

## 2019-04-02 DIAGNOSIS — M94261 Chondromalacia, right knee: Secondary | ICD-10-CM | POA: Diagnosis not present

## 2019-04-02 DIAGNOSIS — K449 Diaphragmatic hernia without obstruction or gangrene: Secondary | ICD-10-CM | POA: Diagnosis not present

## 2019-04-02 DIAGNOSIS — S82191A Other fracture of upper end of right tibia, initial encounter for closed fracture: Secondary | ICD-10-CM | POA: Diagnosis not present

## 2019-04-02 DIAGNOSIS — G8918 Other acute postprocedural pain: Secondary | ICD-10-CM | POA: Diagnosis not present

## 2019-04-02 DIAGNOSIS — Z888 Allergy status to other drugs, medicaments and biological substances status: Secondary | ICD-10-CM | POA: Insufficient documentation

## 2019-04-02 DIAGNOSIS — M23200 Derangement of unspecified lateral meniscus due to old tear or injury, right knee: Secondary | ICD-10-CM | POA: Diagnosis not present

## 2019-04-02 HISTORY — PX: KNEE ARTHROSCOPY WITH SUBCHONDROPLASTY: SHX6732

## 2019-04-02 LAB — BASIC METABOLIC PANEL
Anion gap: 9 (ref 5–15)
BUN: 9 mg/dL (ref 6–20)
CO2: 25 mmol/L (ref 22–32)
Calcium: 9.2 mg/dL (ref 8.9–10.3)
Chloride: 107 mmol/L (ref 98–111)
Creatinine, Ser: 0.83 mg/dL (ref 0.44–1.00)
GFR calc Af Amer: >60 mL/min (ref >60)
GFR calc non Af Amer: >60 mL/min (ref >60)
Glucose, Bld: 66 mg/dL — ABNORMAL LOW (ref 70–99)
Potassium: 3.4 mmol/L — ABNORMAL LOW (ref 3.5–5.1)
Sodium: 141 mmol/L (ref 135–145)

## 2019-04-02 LAB — GLUCOSE, CAPILLARY: Glucose-Capillary: 92 mg/dL (ref 70–99)

## 2019-04-02 SURGERY — ARTHROSCOPY, KNEE, WITH SUBCHONDROPLASTY
Anesthesia: Regional | Site: Knee | Laterality: Right

## 2019-04-02 MED ORDER — PROPOFOL 10 MG/ML IV BOLUS
INTRAVENOUS | Status: AC
  Filled 2019-04-02: qty 20

## 2019-04-02 MED ORDER — PHENYLEPHRINE 40 MCG/ML (10ML) SYRINGE FOR IV PUSH (FOR BLOOD PRESSURE SUPPORT)
PREFILLED_SYRINGE | INTRAVENOUS | Status: DC | PRN
  Administered 2019-04-02 (×2): 80 ug via INTRAVENOUS

## 2019-04-02 MED ORDER — ONDANSETRON 4 MG PO TBDP: 4.0000 mg | ORAL_TABLET | Freq: Three times a day (TID) | ORAL | 0 refills | Status: DC | PRN

## 2019-04-02 MED ORDER — FENTANYL CITRATE (PF) 100 MCG/2ML IJ SOLN
25.0000 ug | INTRAMUSCULAR | Status: DC | PRN
  Administered 2019-04-02 (×2): 50 ug via INTRAVENOUS

## 2019-04-02 MED ORDER — OXYCODONE HCL 5 MG PO TABS: 5.0000 mg | ORAL_TABLET | ORAL | 0 refills | Status: AC | PRN

## 2019-04-02 MED ORDER — FENTANYL CITRATE (PF) 100 MCG/2ML IJ SOLN
INTRAMUSCULAR | Status: AC
  Administered 2019-04-02: 50 ug via INTRAVENOUS
  Filled 2019-04-02: qty 2

## 2019-04-02 MED ORDER — MIDAZOLAM HCL 2 MG/2ML IJ SOLN
INTRAMUSCULAR | Status: AC
  Filled 2019-04-02: qty 2

## 2019-04-02 MED ORDER — ONDANSETRON HCL 4 MG/2ML IJ SOLN
INTRAMUSCULAR | Status: DC | PRN
  Administered 2019-04-02: 4 mg via INTRAVENOUS

## 2019-04-02 MED ORDER — FENTANYL CITRATE (PF) 100 MCG/2ML IJ SOLN
INTRAMUSCULAR | Status: AC
  Filled 2019-04-02: qty 2

## 2019-04-02 MED ORDER — ONDANSETRON HCL 4 MG/2ML IJ SOLN: 4.0000 mg | Freq: Four times a day (QID) | INTRAMUSCULAR | Status: DC | PRN

## 2019-04-02 MED ORDER — DEXAMETHASONE SODIUM PHOSPHATE 10 MG/ML IJ SOLN
INTRAMUSCULAR | Status: AC
  Filled 2019-04-02: qty 1

## 2019-04-02 MED ORDER — FENTANYL CITRATE (PF) 100 MCG/2ML IJ SOLN
50.0000 ug | Freq: Once | INTRAMUSCULAR | Status: AC
  Administered 2019-04-02: 12:00:00 50 ug via INTRAVENOUS
  Filled 2019-04-02: qty 1

## 2019-04-02 MED ORDER — CHLORHEXIDINE GLUCONATE 4 % EX LIQD: 60.0000 mL | Freq: Once | CUTANEOUS | Status: DC

## 2019-04-02 MED ORDER — FENTANYL CITRATE (PF) 100 MCG/2ML IJ SOLN
INTRAMUSCULAR | Status: DC | PRN
  Administered 2019-04-02: 50 ug via INTRAVENOUS
  Administered 2019-04-02 (×2): 25 ug via INTRAVENOUS
  Administered 2019-04-02: 50 ug via INTRAVENOUS

## 2019-04-02 MED ORDER — SODIUM CHLORIDE 0.9 % IR SOLN
Status: DC | PRN
  Administered 2019-04-02: 3000 mL

## 2019-04-02 MED ORDER — MIDAZOLAM HCL 5 MG/5ML IJ SOLN
INTRAMUSCULAR | Status: DC | PRN
  Administered 2019-04-02: 1 mg via INTRAVENOUS

## 2019-04-02 MED ORDER — MIDAZOLAM HCL 2 MG/2ML IJ SOLN
INTRAMUSCULAR | Status: AC
  Administered 2019-04-02: 2 mg via INTRAVENOUS
  Filled 2019-04-02: qty 2

## 2019-04-02 MED ORDER — CEFAZOLIN SODIUM-DEXTROSE 2-4 GM/100ML-% IV SOLN
2.0000 g | INTRAVENOUS | Status: AC
  Administered 2019-04-02: 2 g via INTRAVENOUS
  Filled 2019-04-02: qty 100

## 2019-04-02 MED ORDER — PROPOFOL 10 MG/ML IV BOLUS
INTRAVENOUS | Status: DC | PRN
  Administered 2019-04-02: 40 mg via INTRAVENOUS
  Administered 2019-04-02: 160 mg via INTRAVENOUS

## 2019-04-02 MED ORDER — DEXAMETHASONE SODIUM PHOSPHATE 10 MG/ML IJ SOLN
INTRAMUSCULAR | Status: DC | PRN
  Administered 2019-04-02: 10 mg via INTRAVENOUS

## 2019-04-02 MED ORDER — FENTANYL CITRATE (PF) 250 MCG/5ML IJ SOLN
INTRAMUSCULAR | Status: AC
  Filled 2019-04-02: qty 5

## 2019-04-02 MED ORDER — LIDOCAINE 2% (20 MG/ML) 5 ML SYRINGE
INTRAMUSCULAR | Status: DC | PRN
  Administered 2019-04-02: 60 mg via INTRAVENOUS

## 2019-04-02 MED ORDER — MIDAZOLAM HCL 2 MG/2ML IJ SOLN
2.0000 mg | Freq: Once | INTRAMUSCULAR | Status: AC
  Administered 2019-04-02: 2 mg via INTRAVENOUS
  Filled 2019-04-02: qty 2

## 2019-04-02 MED ORDER — LACTATED RINGERS IV SOLN
INTRAVENOUS | Status: DC
  Administered 2019-04-02: 11:00:00 via INTRAVENOUS

## 2019-04-02 MED ORDER — BUPIVACAINE-EPINEPHRINE (PF) 0.5% -1:200000 IJ SOLN
INTRAMUSCULAR | Status: DC | PRN
  Administered 2019-04-02: 20 mL via PERINEURAL

## 2019-04-02 MED ORDER — ONDANSETRON HCL 4 MG/2ML IJ SOLN
INTRAMUSCULAR | Status: AC
  Filled 2019-04-02: qty 2

## 2019-04-02 MED ORDER — CEFAZOLIN SODIUM-DEXTROSE 2-3 GM-%(50ML) IV SOLR
INTRAVENOUS | Status: DC | PRN
  Administered 2019-04-02: 2 g via INTRAVENOUS

## 2019-04-02 MED ORDER — BUPIVACAINE-EPINEPHRINE (PF) 0.25% -1:200000 IJ SOLN
INTRAMUSCULAR | Status: AC
  Filled 2019-04-02: qty 30

## 2019-04-02 SURGICAL SUPPLY — 36 items
ALCOHOL 70% 16 OZ (MISCELLANEOUS) ×2 IMPLANT
BANDAGE ELASTIC 6 VELCRO ST LF (GAUZE/BANDAGES/DRESSINGS) ×1 IMPLANT
BLADE SHAVER TORPEDO 4X13 (MISCELLANEOUS) ×1 IMPLANT
BLADE SURG 10 STRL SS (BLADE) ×1 IMPLANT
COVER SURGICAL LIGHT HANDLE (MISCELLANEOUS) ×2 IMPLANT
COVER WAND RF STERILE (DRAPES) ×1 IMPLANT
DRAPE ARTHROSCOPY W/POUCH 114 (DRAPES) ×1 IMPLANT
DRAPE IMP U-DRAPE 54X76 (DRAPES) ×1 IMPLANT
DRAPE OEC MINIVIEW 54X84 (DRAPES) ×1 IMPLANT
DRAPE U-SHAPE 47X51 STRL (DRAPES) ×2 IMPLANT
DRSG PAD ABDOMINAL 8X10 ST (GAUZE/BANDAGES/DRESSINGS) ×1 IMPLANT
DRSG XEROFORM 1X8 (GAUZE/BANDAGES/DRESSINGS) ×1 IMPLANT
DURAPREP 26ML APPLICATOR (WOUND CARE) ×2 IMPLANT
GAUZE 4X4 16PLY RFD (DISPOSABLE) ×2 IMPLANT
GAUZE SPONGE 4X4 12PLY STRL (GAUZE/BANDAGES/DRESSINGS) ×1 IMPLANT
GAUZE SPONGE 4X4 12PLY STRL LF (GAUZE/BANDAGES/DRESSINGS) ×1 IMPLANT
GAUZE XEROFORM 1X8 LF (GAUZE/BANDAGES/DRESSINGS) ×1 IMPLANT
GLOVE BIO SURGEON STRL SZ7.5 (GLOVE) ×2 IMPLANT
GLOVE BIOGEL PI IND STRL 8 (GLOVE) ×1 IMPLANT
GLOVE BIOGEL PI INDICATOR 8 (GLOVE) ×1
GOWN STRL REUS W/ TWL LRG LVL3 (GOWN DISPOSABLE) ×2 IMPLANT
GOWN STRL REUS W/ TWL XL LVL3 (GOWN DISPOSABLE) IMPLANT
GOWN STRL REUS W/TWL LRG LVL3 (GOWN DISPOSABLE) ×2
GOWN STRL REUS W/TWL XL LVL3 (GOWN DISPOSABLE) ×1
IMPLANT SCP KIT FOOT ANKLE 3 (Orthopedic Implant) ×2 IMPLANT
KIT ACCUFILL 3CC (Orthopedic Implant) IMPLANT
KIT BASIN OR (CUSTOM PROCEDURE TRAY) ×2 IMPLANT
MANIFOLD NEPTUNE II (INSTRUMENTS) ×2 IMPLANT
PACK ARTHROSCOPY DSU (CUSTOM PROCEDURE TRAY) ×2 IMPLANT
PAD ABD 8X10 STRL (GAUZE/BANDAGES/DRESSINGS) ×1 IMPLANT
PADDING CAST COTTON 6X4 STRL (CAST SUPPLIES) ×2 IMPLANT
SUT ETHILON 4 0 PS 2 18 (SUTURE) ×2 IMPLANT
SYR 30ML LL (SYRINGE) ×1 IMPLANT
TOWEL GREEN STERILE (TOWEL DISPOSABLE) ×2 IMPLANT
TUBING ARTHROSCOPY IRRIG 16FT (MISCELLANEOUS) ×2 IMPLANT
WRAP KNEE MAXI GEL POST OP (GAUZE/BANDAGES/DRESSINGS) ×2 IMPLANT

## 2019-04-02 NOTE — Transfer of Care (Signed)
Immediate Anesthesia Transfer of Care Note  Patient: Chavela Justiniano  Procedure(s) Performed: Right knee arthroscopy with chondroplasty and medial tibial plateau subchondroplasty (Right Knee)  Patient Location: PACU  Anesthesia Type:General and Regional  Level of Consciousness: awake, alert , oriented and sedated  Airway & Oxygen Therapy: Patient Spontanous Breathing and Patient connected to nasal cannula oxygen  Post-op Assessment: Report given to RN, Post -op Vital signs reviewed and stable and Patient moving all extremities  Post vital signs: Reviewed and stable  Last Vitals:  Vitals Value Taken Time  BP 110/72 04/02/19 1412  Temp    Pulse 84 04/02/19 1416  Resp 13 04/02/19 1416  SpO2 100 % 04/02/19 1416  Vitals shown include unvalidated device data.  Last Pain:  Vitals:   04/02/19 1220  TempSrc:   PainSc: 0-No pain      Patients Stated Pain Goal: 4 (80/99/83 3825)  Complications: No apparent anesthesia complications

## 2019-04-02 NOTE — Anesthesia Procedure Notes (Signed)
Anesthesia Regional Block: Adductor canal block   Pre-Anesthetic Checklist: ,, timeout performed, Correct Patient, Correct Site, Correct Laterality, Correct Procedure, Correct Position, site marked, Risks and benefits discussed,  Surgical consent,  Pre-op evaluation,  At surgeon's request and post-op pain management  Laterality: Right  Prep: chloraprep       Needles:  Injection technique: Single-shot  Needle Type: Echogenic Needle     Needle Length: 9cm  Needle Gauge: 21     Additional Needles:   Narrative:  Start time: 04/02/2019 12:02 PM End time: 04/02/2019 12:08 PM Injection made incrementally with aspirations every 5 mL.  Performed by: Personally  Anesthesiologist: Albertha Ghee, MD  Additional Notes: Pt tolerated the procedure well.

## 2019-04-02 NOTE — Anesthesia Procedure Notes (Signed)
Procedure Name: LMA Insertion Date/Time: 04/02/2019 1:09 PM Performed by: Scheryl Darter, CRNA Pre-anesthesia Checklist: Patient identified, Emergency Drugs available, Suction available and Patient being monitored Patient Re-evaluated:Patient Re-evaluated prior to induction Oxygen Delivery Method: Circle System Utilized Preoxygenation: Pre-oxygenation with 100% oxygen Induction Type: IV induction Ventilation: Mask ventilation without difficulty LMA: LMA inserted LMA Size: 4.0 Number of attempts: 1 Placement Confirmation: positive ETCO2 Tube secured with: Tape Dental Injury: Teeth and Oropharynx as per pre-operative assessment

## 2019-04-02 NOTE — Op Note (Signed)
Surgeon(s): Sara Stairs, MD   ANESTHESIA:  general, and regional   FLUIDS: Per anesthesia record.    ESTIMATED BLOOD LOSS: minimal     PREOPERATIVE DIAGNOSES:  1.  Right medial tibial plateau insufficiency fracture 2..   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  Right medial tibial plateau insufficiency fracture 2..   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 Right knee lateral meniscus tear   PROCEDURES PERFORMED:  1. Right knee arthroscopically aided treatment of medial tibial plateau insufficiency fracture with percutaneous internal fixation (subchondroplasty)  2. Right knee arthroscopy with arthroscopic partial lateral meniscectomy 3.  Chondroplasty, right medial femoral condyle, medial tibial plateau, and posterior patella   Implant: Flowable calcium phosphate, 3 mL. Zimmer   DESCRIPTION OF PROCEDURE: The patient has a right knee medial tibial plateau stress fracture. They have had pain that has been refractory to conservative management. Their preoperative MRI demonstrated subchondral bone marrow edema and insufficiency fractures , as well as the lateral meniscus tear and chondromalacia. Plans are to proceed with partial lateral meniscectomy, internal fixation of subchondral insufficiency fractures with flowable calcium phosphate, and diagnostic arthroscopy with debridement as indicated. Full discussion held regarding risks benefits alternatives and complications related surgical intervention. Conservative care options reviewed. All questions answered.   The patient was identified in the preoperative holding area and the operative extremity was marked. The patient was brought to the operating room and transferred to operating table in a supine position. Satisfactory general anesthesia was induced by  anesthesiology.   Standard anterolateral, anteromedial arthroscopy portals were obtained. The anteromedial portal was obtained with a spinal needle for localization under direct visualization with subsequent diagnostic findings.    Anteromedial and anterolateral chambers: moderate synovitis. The synovitis was debrided with a 4.5 mm full radius shaver through both the anteromedial and lateral portals.    Suprapatellar pouch and gutters: mild synovitis or debris. Patella chondral surface: Grade 3 Trochlear chondral surface: Grade 4 Patellofemoral tracking: level Medial meniscus: intact without tear  Medial femoral condyle flexion bearing surface: Grade 2 Medial femoral condyle extension bearing surface: Grade 2 Medial tibial plateau: Grade 2 Anterior cruciate ligament:stable Posterior cruciate ligament:stable Lateral meniscus: anterior horn and midbody tear   Lateral femoral condyle flexion bearing surface: Grade 0 Lateral femoral condyle extension bearing surface: Grade 0 Lateral tibial plateau: Grade 2   Lateral meniscus tear was debrided using biters and motorized shaver alternating until a stable remnant was left. Upon completion the probe was used to evaluate and assess the remaining meniscus which was gleaned to be stable.   Chondroplasty was achieved on the medial femoral condyle using a motorized shaver to debride the grade 3 unstable cartilage. Completion of the chondroplasty left A medial femoral condyle with smooth stable surface. There was no full-thickness component noted.   Next we turned our attention to the internal fixation of the medial tibial condyle. Arthroscopically we evaluated the condyle noted there was no loose cartilage or debris surrounding the lesion and the fracture did not propagate to the joint surface. Using preoperative MRI we targeted the delivery device to just under the subchondral density and in the medial tibial plateau. This was achieved with  intraoperative fluoroscopy. Once accurate placement was noted on 2 views and confirmed we delivered 3 mL of flowable calcium phosphate into the tibial plateau. We left the cannulas in place for approximately 8 minutes  while the implant hardened. We removed the cannulas and again took 2 views of fluoroscopic pictures to confirm there was no extravasation outside of the bone. There was none noted.   After completion of synovectomy, diagnostic exam, and debridements as described, all compartments were checked and no residual debris remained. Hemostasis was achieved with the cautery wand. The portals were approximated with nylon suture. All excess fluid was expressed from the joint.  Xeroform sterile gauze dressings were applied followed by Ace bandage and ice pack.    There were no immediate competitions and all counts were correct.   DISPOSITION: The patient was awakened from general anesthetic, extubated, taken to the recovery room in medically stable condition, no apparent complications. The patient may be weightbearing as tolerated to the operative lower extremity with crutches.  Range of motion of right knee as tolerated.  They will use bid asa for DVT ppx, and return in 2 weeks for suture removal.   [name]

## 2019-04-02 NOTE — H&P (Signed)
ORTHOPAEDIC H and P  REQUESTING PHYSICIAN: Nicholes Stairs, MD  PCP:  Laurey Morale, MD  Chief Complaint: Chronic right knee pain  HPI: [name] is a 63 y.o. female who complains of right knee pain over the last 8 months.  She has had exhaustive conservative treatment from my partner and myself to include steroid injection activity modification and oral medications.  She presents today for arthroscopic chondroplasty and debridement with medial tibial plateau stress fracture treatment.  She has no new complaints of pain just the persistent pain.  She does note that there is a cyst that has been tender just above the proximal pole of the patella.  She is interested in having that removed as well.  Past Medical History:  Diagnosis Date  . Anxiety   . Arthritis   . Aspiration pneumonia ([place])   . Bronchitis   . 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 Chronic neck pain    sees Dr. Debarah Crape and Dondra Prader NP at [facility] in [place]   . Depression   . Difficult intubation   . GERD (gastroesophageal reflux disease)   . Headache(784.0)   . Hiatal hernia   . Ovarian cyst   . Pneumonia    hx x4  . PONV (postoperative nausea and vomiting) 08/06/11  . Post traumatic stress disorder (PTSD)   . Pre-diabetes   . Respiratory failure [facility])    Past Surgical History:  Procedure Laterality Date  . ABDOMINAL HYSTERECTOMY  88  . CESAREAN SECTION  87  . COLONOSCOPY  Dec. 2014    per Dr. Cristina Gong, 2 benign polyps, repeat in 10 yrs   . DIRECT LARYNGOSCOPY  08/19/2011   Procedure: DIRECT LARYNGOSCOPY;  Surgeon: Onnie Graham;  Location: [facility];  Service: ENT;  Laterality: N/A;  . KNEE SURGERY  14   rt arthroscopy  . ORIF ELBOW FRACTURE  08/06/2011   Procedure: OPEN REDUCTION INTERNAL FIXATION (ORIF) ELBOW/OLECRANON FRACTURE;  Surgeon: Linna Hoff;  Location: [place];  Service: Orthopedics;  Laterality: Left;  . OVARIAN CYST REMOVAL  80  .  TRACHEOSTOMY TUBE PLACEMENT  08/19/2011   Procedure: TRACHEOSTOMY;  Surgeon: Onnie Graham;  Location: [facility];  Service: ENT;  Laterality: N/A;  tracheoscopy   Social History   Socioeconomic History  . Marital status: Widowed    Spouse name: Not on file  . Number of children: Not on file  . Years of education: Not on file  . Highest education level: Not on file  Occupational History  . Not on file  Social Needs  . Financial resource strain: Not on file  . Food insecurity    Worry: Not on file    Inability: Not on file  . Transportation needs    Medical: Not on file    Non-medical: Not on file  Tobacco Use  . Smoking status: Never Smoker  . Smokeless tobacco: Never Used  Substance and Sexual Activity  . Alcohol use: Yes    Alcohol/week: 0.0 standard drinks    Comment: occ  . Drug use: No  . Sexual activity: Yes    Birth control/protection: Post-menopausal  Lifestyle  . Physical activity    Days per week: Not on file    Minutes per session: Not on file  . Stress: Not on file  Relationships  . Social Herbalist on phone: Not on file    Gets together: Not on file  Attends religious service: Not on file    Active member of club or organization: Not on file    Attends meetings of clubs or organizations: Not on file    Relationship status: Not on file  Other Topics Concern  . Not on file  Social History Narrative  . Not on file   Family History  Problem Relation Age of Onset  . Breast cancer Mother   . Breast cancer Maternal Aunt    Allergies  Allergen Reactions  . Hydromorphone Shortness Of Breath    Respiratory distress  . Demerol   . Hydrocodone Itching    Severe itching - can take with benadryl   . Meperidine Hcl Itching    REACTION: hives, itich   Prior to Admission medications   Medication Sig Start Date End Date Taking? Authorizing Provider  diphenhydrAMINE (BENADRYL) 25 mg capsule Take 25 mg by mouth 4 (four) times daily.   Yes [provider]  HYDROcodone-acetaminophen (NORCO) 10-325 MG tablet Take 1 tablet by mouth 4 (four) times daily.   Yes [provider]  Olopatadine HCl 0.2 % SOLN Apply 1 drop to eye daily. 1 drop in both eyes once daily Patient taking differently: Place 1 drop into both eyes daily as needed (EYE IRRITATION).  11/21/18  Yes Laurey Morale, MD  SUMAtriptan (IMITREX) 100 MG tablet Take 1 tablet (100 mg total) by mouth as needed for migraine. May repeat in 2 hours if headache persists or recurs. 07/07/15  Yes Laurey Morale, MD   No results found.  Positive ROS: All other systems have been reviewed and were otherwise negative with the exception of those mentioned in the HPI and as above.  Physical Exam: General: Alert, no acute distress Cardiovascular: No pedal edema Respiratory: No cyanosis, no use of accessory musculature GI: No organomegaly, abdomen is soft and non-tender Skin: No lesions in the area of chief complaint Neurologic: Sensation intact distally Psychiatric: Patient is competent for consent with normal mood and affect Lymphatic: No axillary or cervical lymphadenopathy  MUSCULOSKELETAL:  Right knee:  She does have a subcutaneous cyst just about a centimeter proximal to the patella.  Some this cutaneous fat.  It is nontender and fluctuant with no overlying skin changes.  Otherwise neurovascular intact.  Assessment: 1.  Chondromalacia right knee 2.  Stress fracture right medial tibial plateau 3.  Subcutaneous cyst right knee  Plan: -We discussed at length the nature of the arthroscopic procedure to include debridement with chondroplasty and sub-chondroplasty to treat the medial tibial plateau stress fracture.  -She is also asked if I would be willing to excise the subcutaneous cyst which I think is reasonable given its benign appearance.  -All questions were solicited and answered to her satisfaction.  We discussed the risk of bleeding, infection, damage to  surrounding neurovascular structures, blood clots, and the risk of anesthesia.  She has provided informed consent.  -She will follow-up with me in 2 weeks after discharge home from PACU.    Nicholes Stairs, MD Cell [phone]    04/02/2019 12:24 PM

## 2019-04-02 NOTE — Brief Op Note (Signed)
04/02/2019  2:14 PM  PATIENT:  [name]  63 y.o. female  PRE-OPERATIVE DIAGNOSIS:  Right knee medial tibial plateau stress fracture  POST-OPERATIVE DIAGNOSIS:  Right knee medial tibial plateau stress fracture  PROCEDURE:  Procedure(s): Right knee arthroscopy with chondroplasty, partial lateral meniscectomy, and medial tibial plateau subchondroplasty (Right)  SURGEON:  Surgeon(s) and Role:    * Nicholes Stairs, MD - Primary  PHYSICIAN ASSISTANT:   ASSISTANTS: none   ANESTHESIA:   regional and general  EBL:  10 cc  BLOOD ADMINISTERED:none  DRAINS: none   LOCAL MEDICATIONS USED:  NONE  SPECIMEN:  No Specimen  DISPOSITION OF SPECIMEN:  N/A  COUNTS:  YES  TOURNIQUET:  * No tourniquets in log *  DICTATION: .Note written in EPIC  PLAN OF CARE: Discharge to home after PACU  PATIENT DISPOSITION:  PACU - hemodynamically stable.   Delay start of Pharmacological VTE agent (>24hrs) due to surgical blood loss or risk of bleeding: not applicable

## 2019-04-02 NOTE — Anesthesia Preprocedure Evaluation (Signed)
Anesthesia Evaluation  Patient identified by MRN, date of birth, ID band Patient awake    Reviewed: Allergy & Precautions, H&P , NPO status , Patient's Chart, lab work & pertinent test results  History of Anesthesia Complications (+) PONV, DIFFICULT AIRWAY and history of anesthetic complications  Airway Mallampati: II   Neck ROM: full    Dental  (+) Teeth Intact   Pulmonary neg pulmonary ROS,    breath sounds clear to auscultation       Cardiovascular negative cardio ROS   Rhythm:regular Rate:Normal     Neuro/Psych  Headaches, PSYCHIATRIC DISORDERS Anxiety Depression    GI/Hepatic hiatal hernia, GERD  ,  Endo/Other    Renal/GU      Musculoskeletal  (+) Arthritis ,   Abdominal   Peds  Hematology   Anesthesia Other Findings   Reproductive/Obstetrics                             Anesthesia Physical Anesthesia Plan  ASA: II  Anesthesia Plan: General   Post-op Pain Management:  Regional for Post-op pain   Induction: Intravenous  PONV Risk Score and Plan: 4 or greater and Ondansetron, Dexamethasone, Midazolam and Treatment may vary due to age or medical condition  Airway Management Planned: LMA  Additional Equipment:   Intra-op Plan:   Post-operative Plan:   Informed Consent: I have reviewed the patients History and Physical, chart, labs and discussed the procedure including the risks, benefits and alternatives for the proposed anesthesia with the patient or authorized representative who has indicated his/her understanding and acceptance.       Plan Discussed with: CRNA, Anesthesiologist and Surgeon  Anesthesia Plan Comments:         Anesthesia Quick Evaluation

## 2019-04-02 NOTE — Discharge Instructions (Signed)
-   you may remove your post operative bandages in 3 days and begin showering.  Do not submerge under water, and pat your wounds dry and replace with daily dry dressings  - you may put full weight on your right leg --- apply ice to the right knee for 20-30 minutes per hour that you are awake.  - begin ranging the knee immediately following resolution of your nerve block  - return to see Dr. Stann Mainland in 2 weeks - for the prevention of blood clots take 81 mg asa once per day for 6 weeks  - for mild to moderate pain use ibuprofen (600 mg every 8 hours around the clock) and tylenol (2 extra strength tablets every 6 hours around the clock), and oxycodone for breakthrough pain.

## 2019-04-03 ENCOUNTER — Encounter (HOSPITAL_COMMUNITY): Payer: Self-pay | Admitting: Orthopedic Surgery

## 2019-04-03 NOTE — Anesthesia Postprocedure Evaluation (Signed)
Anesthesia Post Note  Patient: [name]  Procedure(s) Performed: Right knee arthroscopy with chondroplasty and medial tibial plateau subchondroplasty (Right Knee)     Patient location during evaluation: PACU Anesthesia Type: Regional and General Level of consciousness: awake and alert Pain management: pain level controlled Vital Signs Assessment: post-procedure vital signs reviewed and stable Respiratory status: spontaneous breathing, nonlabored ventilation, respiratory function stable and patient connected to nasal cannula oxygen Cardiovascular status: blood pressure returned to baseline and stable Postop Assessment: no apparent nausea or vomiting Anesthetic complications: no    Last Vitals:  Vitals:   04/02/19 1454 04/02/19 1508  BP: 101/76 111/82  Pulse: 80 82  Resp: 11 16  Temp:    SpO2: 95% 97%    Last Pain:  Vitals:   04/02/19 1530  TempSrc:   PainSc: 0-No pain                 Monserrat Vidaurri S

## 2019-04-09 DIAGNOSIS — M84361D Stress fracture, right tibia, subsequent encounter for fracture with routine healing: Secondary | ICD-10-CM | POA: Diagnosis not present

## 2019-04-15 ENCOUNTER — Encounter (HOSPITAL_COMMUNITY): Payer: Self-pay | Admitting: Orthopedic Surgery

## 2019-04-16 DIAGNOSIS — M84361D Stress fracture, right tibia, subsequent encounter for fracture with routine healing: Secondary | ICD-10-CM | POA: Diagnosis not present

## 2019-04-19 DIAGNOSIS — M84361D Stress fracture, right tibia, subsequent encounter for fracture with routine healing: Secondary | ICD-10-CM | POA: Diagnosis not present

## 2019-04-23 DIAGNOSIS — M84361D Stress fracture, right tibia, subsequent encounter for fracture with routine healing: Secondary | ICD-10-CM | POA: Diagnosis not present

## 2019-04-25 DIAGNOSIS — M84361D Stress fracture, right tibia, subsequent encounter for fracture with routine healing: Secondary | ICD-10-CM | POA: Diagnosis not present

## 2019-05-02 DIAGNOSIS — M84361D Stress fracture, right tibia, subsequent encounter for fracture with routine healing: Secondary | ICD-10-CM | POA: Diagnosis not present

## 2019-05-06 DIAGNOSIS — M84361D Stress fracture, right tibia, subsequent encounter for fracture with routine healing: Secondary | ICD-10-CM | POA: Diagnosis not present

## 2019-05-15 DIAGNOSIS — M84361D Stress fracture, right tibia, subsequent encounter for fracture with routine healing: Secondary | ICD-10-CM | POA: Diagnosis not present

## 2019-05-17 DIAGNOSIS — M84361D Stress fracture, right tibia, subsequent encounter for fracture with routine healing: Secondary | ICD-10-CM | POA: Diagnosis not present

## 2019-05-22 DIAGNOSIS — M84361D Stress fracture, right tibia, subsequent encounter for fracture with routine healing: Secondary | ICD-10-CM | POA: Diagnosis not present

## 2019-05-24 DIAGNOSIS — M84361D Stress fracture, right tibia, subsequent encounter for fracture with routine healing: Secondary | ICD-10-CM | POA: Diagnosis not present

## 2019-05-31 DIAGNOSIS — B078 Other viral warts: Secondary | ICD-10-CM | POA: Diagnosis not present

## 2019-05-31 DIAGNOSIS — L718 Other rosacea: Secondary | ICD-10-CM | POA: Diagnosis not present

## 2019-06-04 DIAGNOSIS — M25569 Pain in unspecified knee: Secondary | ICD-10-CM | POA: Diagnosis not present

## 2019-06-19 DIAGNOSIS — M25561 Pain in right knee: Secondary | ICD-10-CM | POA: Diagnosis not present

## 2019-07-11 ENCOUNTER — Telehealth: Payer: Self-pay

## 2019-07-11 NOTE — Telephone Encounter (Signed)
Copied from [place] [id number]. Topic: General - Other >> Jul 11, 2019 11:49 AM Carolyn Stare wrote: Pt req the shingrix vaccine

## 2019-07-11 NOTE — Telephone Encounter (Signed)
Left message to return phone call. CRM created!

## 2019-08-12 DIAGNOSIS — Z4789 Encounter for other orthopedic aftercare: Secondary | ICD-10-CM | POA: Diagnosis not present

## 2019-08-12 DIAGNOSIS — S83281A Other tear of lateral meniscus, current injury, right knee, initial encounter: Secondary | ICD-10-CM | POA: Diagnosis not present

## 2019-09-16 DIAGNOSIS — M25569 Pain in unspecified knee: Secondary | ICD-10-CM | POA: Diagnosis not present

## 2019-12-11 DIAGNOSIS — M84361A Stress fracture, right tibia, initial encounter for fracture: Secondary | ICD-10-CM | POA: Diagnosis not present

## 2019-12-11 DIAGNOSIS — M25561 Pain in right knee: Secondary | ICD-10-CM | POA: Diagnosis not present

## 2020-02-12 DIAGNOSIS — D485 Neoplasm of uncertain behavior of skin: Secondary | ICD-10-CM | POA: Diagnosis not present

## 2020-02-12 DIAGNOSIS — L820 Inflamed seborrheic keratosis: Secondary | ICD-10-CM | POA: Diagnosis not present

## 2020-02-12 DIAGNOSIS — D225 Melanocytic nevi of trunk: Secondary | ICD-10-CM | POA: Diagnosis not present

## 2020-02-12 DIAGNOSIS — S40861A Insect bite (nonvenomous) of right upper arm, initial encounter: Secondary | ICD-10-CM | POA: Diagnosis not present

## 2020-04-01 DIAGNOSIS — Z1231 Encounter for screening mammogram for malignant neoplasm of breast: Secondary | ICD-10-CM | POA: Diagnosis not present

## 2020-04-01 LAB — HM MAMMOGRAPHY

## 2020-04-03 ENCOUNTER — Encounter: Payer: Self-pay | Admitting: Family Medicine

## 2020-04-30 DIAGNOSIS — S83281D Other tear of lateral meniscus, current injury, right knee, subsequent encounter: Secondary | ICD-10-CM | POA: Diagnosis not present

## 2020-04-30 DIAGNOSIS — M7051 Other bursitis of knee, right knee: Secondary | ICD-10-CM | POA: Diagnosis not present

## 2020-07-23 ENCOUNTER — Other Ambulatory Visit: Payer: Self-pay

## 2020-07-23 ENCOUNTER — Encounter (HOSPITAL_COMMUNITY): Payer: Self-pay

## 2020-07-23 ENCOUNTER — Inpatient Hospital Stay (HOSPITAL_COMMUNITY)
Admission: EM | Admit: 2020-07-23 | Discharge: 2020-07-27 | DRG: 918 | Disposition: A | Discharge status: Other Acute Inpatient Hospital | Payer: Federal, State, Local not specified - PPO | Attending: Internal Medicine | Admitting: Internal Medicine

## 2020-07-23 DIAGNOSIS — R7303 Prediabetes: Secondary | ICD-10-CM | POA: Diagnosis not present

## 2020-07-23 DIAGNOSIS — T68XXXA Hypothermia, initial encounter: Secondary | ICD-10-CM

## 2020-07-23 DIAGNOSIS — F4310 Post-traumatic stress disorder, unspecified: Secondary | ICD-10-CM | POA: Diagnosis present

## 2020-07-23 DIAGNOSIS — T391X2A Poisoning by 4-Aminophenol derivatives, intentional self-harm, initial encounter: Principal | ICD-10-CM

## 2020-07-23 DIAGNOSIS — F32A Depression, unspecified: Secondary | ICD-10-CM | POA: Diagnosis not present

## 2020-07-23 DIAGNOSIS — T50902A Poisoning by unspecified drugs, medicaments and biological substances, intentional self-harm, initial encounter: Secondary | ICD-10-CM

## 2020-07-23 DIAGNOSIS — Z818 Family history of other mental and behavioral disorders: Secondary | ICD-10-CM

## 2020-07-23 DIAGNOSIS — T50901A Poisoning by unspecified drugs, medicaments and biological substances, accidental (unintentional), initial encounter: Secondary | ICD-10-CM | POA: Diagnosis not present

## 2020-07-23 DIAGNOSIS — R0681 Apnea, not elsewhere classified: Secondary | ICD-10-CM | POA: Diagnosis not present

## 2020-07-23 DIAGNOSIS — T40602A Poisoning by unspecified narcotics, intentional self-harm, initial encounter: Secondary | ICD-10-CM | POA: Diagnosis present

## 2020-07-23 DIAGNOSIS — F1910 Other psychoactive substance abuse, uncomplicated: Secondary | ICD-10-CM | POA: Diagnosis not present

## 2020-07-23 DIAGNOSIS — Y906 Blood alcohol level of 120-199 mg/100 ml: Secondary | ICD-10-CM | POA: Diagnosis present

## 2020-07-23 DIAGNOSIS — Z90710 Acquired absence of both cervix and uterus: Secondary | ICD-10-CM | POA: Diagnosis not present

## 2020-07-23 DIAGNOSIS — J90 Pleural effusion, not elsewhere classified: Secondary | ICD-10-CM | POA: Diagnosis not present

## 2020-07-23 DIAGNOSIS — E1165 Type 2 diabetes mellitus with hyperglycemia: Secondary | ICD-10-CM | POA: Diagnosis not present

## 2020-07-23 DIAGNOSIS — G43909 Migraine, unspecified, not intractable, without status migrainosus: Secondary | ICD-10-CM | POA: Diagnosis not present

## 2020-07-23 DIAGNOSIS — T402X2A Poisoning by other opioids, intentional self-harm, initial encounter: Secondary | ICD-10-CM | POA: Diagnosis not present

## 2020-07-23 DIAGNOSIS — K219 Gastro-esophageal reflux disease without esophagitis: Secondary | ICD-10-CM | POA: Diagnosis present

## 2020-07-23 DIAGNOSIS — R4182 Altered mental status, unspecified: Secondary | ICD-10-CM | POA: Diagnosis not present

## 2020-07-23 DIAGNOSIS — Z9151 Personal history of suicidal behavior: Secondary | ICD-10-CM

## 2020-07-23 DIAGNOSIS — R404 Transient alteration of awareness: Secondary | ICD-10-CM | POA: Diagnosis not present

## 2020-07-23 DIAGNOSIS — R680 Hypothermia, not associated with low environmental temperature: Secondary | ICD-10-CM | POA: Diagnosis present

## 2020-07-23 DIAGNOSIS — Z20822 Contact with and (suspected) exposure to covid-19: Secondary | ICD-10-CM | POA: Diagnosis not present

## 2020-07-23 DIAGNOSIS — Z23 Encounter for immunization: Secondary | ICD-10-CM

## 2020-07-23 DIAGNOSIS — R402431 Glasgow coma scale score 3-8, in the field [EMT or ambulance]: Secondary | ICD-10-CM | POA: Diagnosis not present

## 2020-07-23 DIAGNOSIS — Z79899 Other long term (current) drug therapy: Secondary | ICD-10-CM | POA: Diagnosis not present

## 2020-07-23 DIAGNOSIS — R9431 Abnormal electrocardiogram [ECG] [EKG]: Secondary | ICD-10-CM | POA: Diagnosis not present

## 2020-07-23 LAB — CBC WITH DIFFERENTIAL/PLATELET
Abs Immature Granulocytes: 0.06 10*3/uL (ref 0.00–0.07)
Basophils Absolute: 0 10*3/uL (ref 0.0–0.1)
Basophils Relative: 0 %
Eosinophils Absolute: 0 10*3/uL (ref 0.0–0.5)
Eosinophils Relative: 0 %
HCT: 47.5 % — ABNORMAL HIGH (ref 36.0–46.0)
Hemoglobin: 14.7 g/dL (ref 12.0–15.0)
Immature Granulocytes: 1 %
Lymphocytes Relative: 14 %
Lymphs Abs: 1.4 10*3/uL (ref 0.7–4.0)
MCH: 30.9 pg (ref 26.0–34.0)
MCHC: 30.9 g/dL (ref 30.0–36.0)
MCV: 99.8 fL (ref 80.0–100.0)
Monocytes Absolute: 0.4 10*3/uL (ref 0.1–1.0)
Monocytes Relative: 4 %
Neutro Abs: 7.8 10*3/uL — ABNORMAL HIGH (ref 1.7–7.7)
Neutrophils Relative %: 81 %
Platelets: 191 10*3/uL (ref 150–400)
RBC: 4.76 MIL/uL (ref 3.87–5.11)
RDW: 13.3 % (ref 11.5–15.5)
WBC: 9.6 10*3/uL (ref 4.0–10.5)
nRBC: 0 % (ref 0.0–0.2)

## 2020-07-23 LAB — ETHANOL: Alcohol, Ethyl (B): 158 mg/dL — ABNORMAL HIGH (ref <10)

## 2020-07-23 LAB — CBG MONITORING, ED: Glucose-Capillary: 170 mg/dL — ABNORMAL HIGH (ref 70–99)

## 2020-07-23 LAB — SALICYLATE LEVEL: Salicylate Lvl: <7 mg/dL — ABNORMAL LOW (ref 7.0–30.0)

## 2020-07-23 LAB — ACETAMINOPHEN LEVEL: Acetaminophen (Tylenol), Serum: 109 ug/mL — ABNORMAL HIGH (ref 10–30)

## 2020-07-23 MED ORDER — LORAZEPAM 2 MG/ML IJ SOLN
2.0000 mg | Freq: Once | INTRAMUSCULAR | Status: AC
  Administered 2020-07-23: 2 mg via INTRAVENOUS
  Filled 2020-07-23: qty 1

## 2020-07-23 MED ORDER — NALOXONE HCL 0.4 MG/ML IJ SOLN: 0.4000 mg | INTRAMUSCULAR | Status: DC | PRN

## 2020-07-23 MED ORDER — SODIUM CHLORIDE 0.9 % IV BOLUS
1000.0000 mL | Freq: Once | INTRAVENOUS | Status: AC
  Administered 2020-07-24: 1000 mL via INTRAVENOUS

## 2020-07-23 NOTE — ED Notes (Addendum)
Denies, RN contacted with poison control.   Recommendations include Narcan as needed ( if administered watch for 4 hours).   Observation for 8 hours from time of arrival.   Check EKG, acetaminophen and salicylate levels.

## 2020-07-23 NOTE — ED Triage Notes (Signed)
Pt arrives EMS with PD present. PD called out to motel for possible overdose. Upon arrival EMS note pinpoint pupils and unknown amount of oxycodone consumed. EMS report pt sent a suicidal text to someone and they notified PD. Pt received 2 mg narcan and had positive results. Pt became aggressive with EMS when daughter was present and received 2.5mg  Versed.

## 2020-07-23 NOTE — ED Notes (Signed)
Restraints removed at this time.  Pt calm and cooperative resting in bed.  Respiration even and unlabored.  NADN.  Will continue to monitor.

## 2020-07-23 NOTE — ED Provider Notes (Signed)
[facility] DEPT Provider Note   CSN: [id number] Arrival date & time: 07/23/20  2153     History Chief Complaint  Patient presents with  . Intentional Drug Overdose    [name] is a 64 y.o. female.  Patient presents to the emergency department with a chief complaint of oxycodone overdose.  She is brought in by [facility] and EMS.  She was initially found with pinpoint pupils and unresponsive.  She was given 2 mg of Narcan.  She then became agitated upon being brought to the hospital and was given 2.5 mg of Versed by EMS.  She states repeatedly "let me die."  She does not otherwise contribute to her history.  Level five caveat applies.  The history is provided by the patient and the police. No language interpreter was used.       Past Medical History:  Diagnosis Date  . Anxiety   . Arthritis   . Aspiration pneumonia ([place])   . Bronchitis   . 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 Chronic neck pain    sees Dr. Debarah Crape and Dondra Prader NP at [facility] in [place]   . Depression   . Difficult intubation   . GERD (gastroesophageal reflux disease)   . Headache(784.0)   . Hiatal hernia   . Ovarian cyst   . Pneumonia    hx x4  . PONV (postoperative nausea and vomiting) 08/06/11  . Post traumatic stress disorder (PTSD)   . Pre-diabetes   . Respiratory failure [facility])     Patient Active Problem List   Diagnosis Date Noted  . Acute lateral meniscus tear of right knee 04/02/2019  . Migraines 07/07/2015  . ARF (acute respiratory failure) ([place]) 08/31/2011  . Respiratory failure ([place]) 08/30/2011  . Aspiration pneumonia ([place]) 08/30/2011  . Hypoxia 08/30/2011  . 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 Physical deconditioning 08/30/2011  . Encephalopathy acute 08/30/2011  . Stridor 08/18/2011  . Radial head fracture, closed 08/06/2011    Class: Acute  . ACUTE SINUSITIS, UNSPECIFIED 06/23/2009  . ANXIETY  04/10/2009  . DEPRESSION 04/10/2009  . SHOULDER PAIN, CHRONIC 04/10/2009  . HIP PAIN, CHRONIC 04/10/2009  . NECK PAIN, CHRONIC 04/10/2009    Past Surgical History:  Procedure Laterality Date  . ABDOMINAL HYSTERECTOMY  88  . CESAREAN SECTION  87  . COLONOSCOPY  Dec. 2014    per Dr. Cristina Gong, 2 benign polyps, repeat in 10 yrs   . DIRECT LARYNGOSCOPY  08/19/2011   Procedure: DIRECT LARYNGOSCOPY;  Surgeon: Onnie Graham;  Location: [facility];  Service: ENT;  Laterality: N/A;  . KNEE ARTHROSCOPY WITH SUBCHONDROPLASTY Right 04/02/2019   Procedure: Right knee arthroscopy with chondroplasty and medial tibial plateau subchondroplasty;  Surgeon: Nicholes Stairs, MD;  Location: [place];  Service: Orthopedics;  Laterality: Right;  . KNEE SURGERY  14   rt arthroscopy  . ORIF ELBOW FRACTURE  08/06/2011   Procedure: OPEN REDUCTION INTERNAL FIXATION (ORIF) ELBOW/OLECRANON FRACTURE;  Surgeon: Linna Hoff;  Location: [facility];  Service: Orthopedics;  Laterality: Left;  . OVARIAN CYST REMOVAL  80  . TRACHEOSTOMY TUBE PLACEMENT  08/19/2011   Procedure: TRACHEOSTOMY;  Surgeon: Onnie Graham;  Location: [facility];  Service: ENT;  Laterality: N/A;  tracheoscopy     OB History   No obstetric history on file.     Family History  Problem Relation Age of Onset  . Breast cancer Mother   . Breast  cancer Maternal Aunt     Social History   Tobacco Use  . Smoking status: Never Smoker  . Smokeless tobacco: Never Used  Vaping Use  . Vaping Use: Never used  Substance Use Topics  . Alcohol use: Yes    Alcohol/week: 0.0 standard drinks    Comment: occ  . Drug use: No    Home Medications Prior to Admission medications   Medication Sig Start Date End Date Taking? Authorizing Provider  diphenhydrAMINE (BENADRYL) 25 mg capsule Take 25 mg by mouth 4 (four) times daily.    [provider]  HYDROcodone-acetaminophen (NORCO) 10-325 MG tablet Take 1 tablet by mouth 4 (four) times daily.    [provider]  Olopatadine HCl 0.2 % SOLN Apply 1 drop to eye daily. 1 drop in both eyes once daily Patient taking differently: Place 1 drop into both eyes daily as needed (EYE IRRITATION).  11/21/18   Laurey Morale, MD  ondansetron (ZOFRAN ODT) 4 MG disintegrating tablet Take 1 tablet (4 mg total) by mouth every 8 (eight) hours as needed. 04/02/19   Nicholes Stairs, MD  SUMAtriptan (IMITREX) 100 MG tablet Take 1 tablet (100 mg total) by mouth as needed for migraine. May repeat in 2 hours if headache persists or recurs. 07/07/15   Laurey Morale, MD    Allergies    Hydrocodone, Hydromorphone, Meperidine, Demerol, and Meperidine hcl  Review of Systems   Review of Systems  Unable to perform ROS: Psychiatric disorder    Physical Exam Updated Vital Signs BP 101/70   Pulse 95   Temp (S) (!) 94.4 F (34.7 C) (Rectal)   Resp 18   Ht 5\' 1"  (1.549 m)   Wt 95 kg   SpO2 97%   BMI 39.57 kg/m   Physical Exam Vitals and nursing note reviewed.  Constitutional:      General: She is not in acute distress.    Appearance: She is well-developed.  HENT:     Head: Normocephalic and atraumatic.  Eyes:     Conjunctiva/sclera: Conjunctivae normal.  Cardiovascular:     Rate and Rhythm: Normal rate and regular rhythm.     Heart sounds: No murmur heard.   Pulmonary:     Effort: Pulmonary effort is normal. No respiratory distress.     Breath sounds: Normal breath sounds.  Abdominal:     General: There is no distension.     Palpations: Abdomen is soft.     Tenderness: There is no abdominal tenderness.  Musculoskeletal:     Cervical back: Neck supple.     Comments: Moves all extremities  Skin:    General: Skin is warm and dry.  Neurological:     Mental Status: She is alert and oriented to person, place, and time.  Psychiatric:     Comments: Agitated     ED Results / Procedures / Treatments   Labs (all labs ordered are listed, but only abnormal results are displayed) Labs  Reviewed  RESPIRATORY PANEL BY RT PCR (FLU A&B, COVID)  COMPREHENSIVE METABOLIC PANEL  SALICYLATE LEVEL  ACETAMINOPHEN LEVEL  ETHANOL  RAPID URINE DRUG SCREEN, HOSP PERFORMED  CBC WITH DIFFERENTIAL/PLATELET  CBG MONITORING, ED    EKG None  Radiology No results found.  Procedures .Critical Care Performed by: Montine Circle, PA-C Authorized by: Montine Circle, PA-C   Critical care provider statement:    Critical care time (minutes):  50   Critical care was necessary to treat or prevent imminent  or life-threatening deterioration of the following conditions:  [place]   Critical care was time spent personally by me on the following activities:  Discussions with consultants, evaluation of patient's response to treatment, examination of patient, ordering and performing treatments and interventions, ordering and review of laboratory studies, ordering and review of radiographic studies, pulse oximetry, re-evaluation of patient's condition, obtaining history from patient or surrogate and review of old charts   (including critical care time)  Medications Ordered in ED Medications  naloxone (NARCAN) injection 0.4 mg (has no administration in time range)    ED Course  I have reviewed the triage vital signs and the nursing notes.  Pertinent labs & imaging results that were available during my care of the patient were reviewed by me and considered in my medical decision making (see chart for details).    MDM Rules/Calculators/A&P                          This patient complains of intentional overdose on Percocet Jan 13, 2024 with intent to kill self, this involves an extensive number of treatment options, and is a complaint that carries with it a high risk of complications and morbidity.    Differential Dx Overdose, Tylenol toxicity, opiate overdose  Pertinent Labs I ordered, reviewed, and interpreted labs, which included CBC, CMP, acetaminophen level, salicylate level,  ethanol, UDS, which are notable for elevated acetaminophen level of 109 initially, repeat is 103 approximately 3 and half hours later.  I discussed this with poison control.  As the time of ingestion is unclear, they recommend using Acetadote.  UDS is notable for opiates, benzos, and cannabis.  Mildly increased ethanol level.  Covid negative.  CBC unremarkable.  LFTs are normal.  No significant electrolyte derangement.  Mild hypernatremia.  Imaging Interpretation I ordered imaging studies which included chest x-ray.  I independently visualized and interpreted the chest x-ray, which showed no obvious abnormality.   Medications I ordered medication Narcan infusion, Acetadote, and fluids for hypotension, concern for Tylenol toxicity, and opiate overdose.   Critical Interventions  Narcan infusion Acetadote  Reassessments After the interventions stated above, I reevaluated the patient and found still fairly somnolent.  She does respond to some questions.  She is protecting her airway.  Consultants Intensivist, who is appreciated for admitting the patient.  Plan Admit to ICU.  Patient seen by discussed with Dr. Randal Buba.  This was a shared visit.    Final Clinical Impression(s) / ED Diagnoses Final diagnoses:  Intentional drug overdose, initial encounter ([place])  Polysubstance abuse ([place])  Hypothermia, initial encounter  Suicide attempt by acetaminophen overdose, initial encounter [facility])    Rx / [place] Orders ED Discharge Orders    None       Montine Circle, PA-C 07/24/20 1962    Palumbo, April, MD 07/24/20 2297

## 2020-07-23 NOTE — ED Notes (Signed)
Pt placed in non violent restraints due to her climbing out of bed and attempting to escape ER.  Pt threatening staff and claiming that she is trying to leave and kill herself.  Provider notified of situation and ordered restraints.  This nurse was informed to notify provider when pt calms down enough to remove restraints.  Respirations even and unlabored.  NADN.  Will continue to monitor.

## 2020-07-23 NOTE — ED Notes (Signed)
Pt endorsed suicidal ideation to nursing staff and admits to ingesting 42 oxycodone 5-325.   Pills counted in container counted 173 of 240. Medications sent to pharmacy.

## 2020-07-24 ENCOUNTER — Emergency Department (HOSPITAL_COMMUNITY): Payer: Federal, State, Local not specified - PPO

## 2020-07-24 ENCOUNTER — Encounter (HOSPITAL_COMMUNITY): Payer: Self-pay | Admitting: Emergency Medicine

## 2020-07-24 DIAGNOSIS — T40602A Poisoning by unspecified narcotics, intentional self-harm, initial encounter: Secondary | ICD-10-CM | POA: Diagnosis not present

## 2020-07-24 DIAGNOSIS — T50901A Poisoning by unspecified drugs, medicaments and biological substances, accidental (unintentional), initial encounter: Secondary | ICD-10-CM | POA: Diagnosis not present

## 2020-07-24 DIAGNOSIS — Z818 Family history of other mental and behavioral disorders: Secondary | ICD-10-CM | POA: Diagnosis not present

## 2020-07-24 DIAGNOSIS — T50902A Poisoning by unspecified drugs, medicaments and biological substances, intentional self-harm, initial encounter: Secondary | ICD-10-CM | POA: Diagnosis not present

## 2020-07-24 DIAGNOSIS — G43909 Migraine, unspecified, not intractable, without status migrainosus: Secondary | ICD-10-CM | POA: Diagnosis present

## 2020-07-24 DIAGNOSIS — R4182 Altered mental status, unspecified: Secondary | ICD-10-CM | POA: Diagnosis present

## 2020-07-24 DIAGNOSIS — F4310 Post-traumatic stress disorder, unspecified: Secondary | ICD-10-CM | POA: Diagnosis not present

## 2020-07-24 DIAGNOSIS — R680 Hypothermia, not associated with low environmental temperature: Secondary | ICD-10-CM | POA: Diagnosis present

## 2020-07-24 DIAGNOSIS — J90 Pleural effusion, not elsewhere classified: Secondary | ICD-10-CM | POA: Diagnosis not present

## 2020-07-24 DIAGNOSIS — Z9151 Personal history of suicidal behavior: Secondary | ICD-10-CM | POA: Diagnosis not present

## 2020-07-24 DIAGNOSIS — E119 Type 2 diabetes mellitus without complications: Secondary | ICD-10-CM | POA: Diagnosis not present

## 2020-07-24 DIAGNOSIS — T391X2A Poisoning by 4-Aminophenol derivatives, intentional self-harm, initial encounter: Secondary | ICD-10-CM | POA: Diagnosis present

## 2020-07-24 DIAGNOSIS — Z79899 Other long term (current) drug therapy: Secondary | ICD-10-CM | POA: Diagnosis not present

## 2020-07-24 DIAGNOSIS — K219 Gastro-esophageal reflux disease without esophagitis: Secondary | ICD-10-CM | POA: Diagnosis not present

## 2020-07-24 DIAGNOSIS — Z62810 Personal history of physical and sexual abuse in childhood: Secondary | ICD-10-CM | POA: Diagnosis not present

## 2020-07-24 DIAGNOSIS — R45851 Suicidal ideations: Secondary | ICD-10-CM | POA: Diagnosis not present

## 2020-07-24 DIAGNOSIS — R7303 Prediabetes: Secondary | ICD-10-CM | POA: Diagnosis present

## 2020-07-24 DIAGNOSIS — Z23 Encounter for immunization: Secondary | ICD-10-CM | POA: Diagnosis not present

## 2020-07-24 DIAGNOSIS — Y906 Blood alcohol level of 120-199 mg/100 ml: Secondary | ICD-10-CM | POA: Diagnosis present

## 2020-07-24 DIAGNOSIS — F329 Major depressive disorder, single episode, unspecified: Secondary | ICD-10-CM | POA: Diagnosis not present

## 2020-07-24 DIAGNOSIS — F1020 Alcohol dependence, uncomplicated: Secondary | ICD-10-CM | POA: Diagnosis not present

## 2020-07-24 DIAGNOSIS — Z90710 Acquired absence of both cervix and uterus: Secondary | ICD-10-CM | POA: Diagnosis not present

## 2020-07-24 DIAGNOSIS — F1290 Cannabis use, unspecified, uncomplicated: Secondary | ICD-10-CM | POA: Diagnosis not present

## 2020-07-24 DIAGNOSIS — Z20822 Contact with and (suspected) exposure to covid-19: Secondary | ICD-10-CM | POA: Diagnosis present

## 2020-07-24 DIAGNOSIS — F32A Depression, unspecified: Secondary | ICD-10-CM | POA: Diagnosis present

## 2020-07-24 HISTORY — DX: Poisoning by unspecified narcotics, intentional self-harm, initial encounter: T40.602A

## 2020-07-24 LAB — RAPID URINE DRUG SCREEN, HOSP PERFORMED
Amphetamines: NOT DETECTED
Barbiturates: NOT DETECTED
Benzodiazepines: POSITIVE — AB
Cocaine: NOT DETECTED
Opiates: POSITIVE — AB
Tetrahydrocannabinol: POSITIVE — AB

## 2020-07-24 LAB — COMPREHENSIVE METABOLIC PANEL
ALT: 20 U/L (ref 0–44)
ALT: 19 U/L (ref 0–44)
AST: 28 U/L (ref 15–41)
AST: 23 U/L (ref 15–41)
Albumin: 3.9 g/dL (ref 3.5–5.0)
Albumin: 3.4 g/dL — ABNORMAL LOW (ref 3.5–5.0)
Alkaline Phosphatase: 61 U/L (ref 38–126)
Alkaline Phosphatase: 47 U/L (ref 38–126)
Anion gap: 16 — ABNORMAL HIGH (ref 5–15)
Anion gap: 11 (ref 5–15)
BUN: 10 mg/dL (ref 8–23)
BUN: 9 mg/dL (ref 8–23)
CO2: 20 mmol/L — ABNORMAL LOW (ref 22–32)
CO2: 20 mmol/L — ABNORMAL LOW (ref 22–32)
Calcium: 8.7 mg/dL — ABNORMAL LOW (ref 8.9–10.3)
Calcium: 7.9 mg/dL — ABNORMAL LOW (ref 8.9–10.3)
Chloride: 110 mmol/L (ref 98–111)
Chloride: 113 mmol/L — ABNORMAL HIGH (ref 98–111)
Creatinine, Ser: 1.02 mg/dL — ABNORMAL HIGH (ref 0.44–1.00)
Creatinine, Ser: 0.77 mg/dL (ref 0.44–1.00)
GFR, Estimated: >60 mL/min (ref >60)
GFR, Estimated: >60 mL/min (ref >60)
Glucose, Bld: 226 mg/dL — ABNORMAL HIGH (ref 70–99)
Glucose, Bld: 116 mg/dL — ABNORMAL HIGH (ref 70–99)
Potassium: 4.3 mmol/L (ref 3.5–5.1)
Potassium: 4 mmol/L (ref 3.5–5.1)
Sodium: 146 mmol/L — ABNORMAL HIGH (ref 135–145)
Sodium: 144 mmol/L (ref 135–145)
Total Bilirubin: 0.4 mg/dL (ref 0.3–1.2)
Total Bilirubin: 0.3 mg/dL (ref 0.3–1.2)
Total Protein: 7.1 g/dL (ref 6.5–8.1)
Total Protein: 6.1 g/dL — ABNORMAL LOW (ref 6.5–8.1)

## 2020-07-24 LAB — CBC
HCT: 39.3 % (ref 36.0–46.0)
Hemoglobin: 12.7 g/dL (ref 12.0–15.0)
MCH: 30.9 pg (ref 26.0–34.0)
MCHC: 32.3 g/dL (ref 30.0–36.0)
MCV: 95.6 fL (ref 80.0–100.0)
Platelets: 183 10*3/uL (ref 150–400)
RBC: 4.11 MIL/uL (ref 3.87–5.11)
RDW: 13.5 % (ref 11.5–15.5)
WBC: 7.7 10*3/uL (ref 4.0–10.5)
nRBC: 0 % (ref 0.0–0.2)

## 2020-07-24 LAB — SALICYLATE LEVEL: Salicylate Lvl: <7 mg/dL — ABNORMAL LOW (ref 7.0–30.0)

## 2020-07-24 LAB — ACETAMINOPHEN LEVEL: Acetaminophen (Tylenol), Serum: 103 ug/mL — ABNORMAL HIGH (ref 10–30)

## 2020-07-24 LAB — PROTIME-INR
INR: 1.2 (ref 0.8–1.2)
Prothrombin Time: 14.5 seconds (ref 11.4–15.2)

## 2020-07-24 LAB — RESPIRATORY PANEL BY RT PCR (FLU A&B, COVID)
Influenza A by PCR: NEGATIVE
Influenza B by PCR: NEGATIVE
SARS Coronavirus 2 by RT PCR: NEGATIVE

## 2020-07-24 LAB — MRSA PCR SCREENING: MRSA by PCR: NEGATIVE

## 2020-07-24 LAB — HIV ANTIBODY (ROUTINE TESTING W REFLEX): HIV Screen 4th Generation wRfx: NONREACTIVE

## 2020-07-24 MED ORDER — INFLUENZA VAC SPLIT QUAD 0.5 ML IM SUSY
0.5000 mL | PREFILLED_SYRINGE | INTRAMUSCULAR | Status: AC
  Administered 2020-07-27: 0.5 mL via INTRAMUSCULAR
  Filled 2020-07-24 (×2): qty 0.5

## 2020-07-24 MED ORDER — DOCUSATE SODIUM 100 MG PO CAPS
100.0000 mg | ORAL_CAPSULE | Freq: Two times a day (BID) | ORAL | Status: DC | PRN
  Administered 2020-07-26: 100 mg via ORAL
  Filled 2020-07-24: qty 1

## 2020-07-24 MED ORDER — CHLORHEXIDINE GLUCONATE CLOTH 2 % EX PADS
6.0000 | MEDICATED_PAD | Freq: Every day | CUTANEOUS | Status: DC
  Administered 2020-07-24 – 2020-07-27 (×4): 6 via TOPICAL

## 2020-07-24 MED ORDER — ONDANSETRON HCL 4 MG/2ML IJ SOLN
4.0000 mg | Freq: Four times a day (QID) | INTRAMUSCULAR | Status: DC | PRN
  Administered 2020-07-24 (×2): 4 mg via INTRAVENOUS
  Filled 2020-07-24: qty 2

## 2020-07-24 MED ORDER — ONDANSETRON HCL 4 MG/2ML IJ SOLN
4.0000 mg | Freq: Once | INTRAMUSCULAR | Status: DC
  Filled 2020-07-24: qty 2

## 2020-07-24 MED ORDER — ENOXAPARIN SODIUM 40 MG/0.4ML ~~LOC~~ SOLN
40.0000 mg | SUBCUTANEOUS | Status: DC
  Administered 2020-07-26 – 2020-07-27 (×2): 40 mg via SUBCUTANEOUS
  Filled 2020-07-24 (×3): qty 0.4

## 2020-07-24 MED ORDER — POLYETHYLENE GLYCOL 3350 17 G PO PACK
17.0000 g | PACK | Freq: Every day | ORAL | Status: DC | PRN
  Administered 2020-07-26: 17 g via ORAL
  Filled 2020-07-24: qty 1

## 2020-07-24 MED ORDER — ORAL CARE MOUTH RINSE
15.0000 mL | Freq: Two times a day (BID) | OROMUCOSAL | Status: DC
  Administered 2020-07-24 – 2020-07-27 (×5): 15 mL via OROMUCOSAL

## 2020-07-24 MED ORDER — ACETYLCYSTEINE LOAD VIA INFUSION
150.0000 mg/kg | Freq: Once | INTRAVENOUS | Status: DC
Start: 2020-07-24 — End: 2020-07-24

## 2020-07-24 MED ORDER — DEXTROSE 5 % IV SOLN
15.0000 mg/kg/h | INTRAVENOUS | Status: DC
  Administered 2020-07-24 (×2): 15 mg/kg/h via INTRAVENOUS
  Filled 2020-07-24 (×4): qty 120

## 2020-07-24 MED ORDER — NALOXONE HCL 4 MG/10ML IJ SOLN
0.5000 mg/h | INTRAVENOUS | Status: DC
  Administered 2020-07-24: 0.25 mg/h via INTRAVENOUS
  Filled 2020-07-24 (×2): qty 10

## 2020-07-24 MED ORDER — SODIUM CHLORIDE 0.9 % IV BOLUS
1000.0000 mL | Freq: Once | INTRAVENOUS | Status: AC
  Administered 2020-07-24: 1000 mL via INTRAVENOUS

## 2020-07-24 MED ORDER — ACETYLCYSTEINE LOAD VIA INFUSION
150.0000 mg/kg | Freq: Once | INTRAVENOUS | Status: AC
  Administered 2020-07-24: 14250 mg via INTRAVENOUS
  Filled 2020-07-24: qty 357

## 2020-07-24 NOTE — Progress Notes (Signed)
Patient was admitted with intentional overdose. Asked patient is she had any suicidal ideation at this time or any plans of hurting herself, patient did say state "no". Patient does remember events that led her to the hospital. Patient does remember drinking alcohol and taking "her medicines." Patient did state that she got into an argument with her daughter and was very emotional discussing it. Placing patient on suicidal precautions. Suicide sitter at bedside. Patient belongings placed in secured bin. Medications will be sent to pharmacy per hospital policy. Patient does have cell phone and glasses at bedside.

## 2020-07-24 NOTE — Progress Notes (Signed)
MEDICATION RELATED CONSULT NOTE - INITIAL   Pharmacy Consult for Acetadote Indication: overdose  Allergies  Allergen Reactions  . Hydrocodone Itching    Severe itching - can take with benadryl  Severe itching - can take with benadryl   . Hydromorphone Shortness Of Breath and Other (See Comments)    Respiratory distress Other reaction(s): Respiratory Distress Respiratory distress Patient became unresponsive  Respiratory distress   . Meperidine Itching  . Demerol   . Meperidine Hcl Itching    REACTION: hives, itich    Patient Measurements: Height: 5\' 1"  (154.9 cm) Weight: 95 kg (209 lb 7 oz) IBW/kg (Calculated) : 47.8   Vital Signs: Temp: 94.4 F (34.7 C) (11/11 2213) Temp Source: Rectal (11/11 2213) BP: 93/60 (11/12 0215) Pulse Rate: 87 (11/12 0215) Intake/Output from previous day: 11/11 0701 - 11/12 0700 In: 2000 [IV Piggyback:2000] Out: -  Intake/Output from this shift: Total I/O In: 2000 [IV Piggyback:2000] Out: -   Labs: Recent Labs    07/23/20 2219  WBC 9.6  HGB 14.7  HCT 47.5*  PLT 191  CREATININE 1.02*  ALBUMIN 3.9  PROT 7.1  AST 28  ALT 20  ALKPHOS 61  BILITOT 0.4   Estimated Creatinine Clearance: 60.2 mL/min (A) (by C-G formula based on SCr of 1.02 mg/dL (H)).   Microbiology: Recent Results (from the past 720 hour(s))  Respiratory Panel by RT PCR (Flu A&B, Covid) - Nasopharyngeal Swab     Status: None   Collection Time: 07/23/20 10:19 PM   Specimen: Nasopharyngeal Swab  Result Value Ref Range Status   SARS Coronavirus 2 by RT PCR NEGATIVE NEGATIVE Final    Comment: (NOTE) SARS-CoV-2 target nucleic acids are NOT DETECTED.  The SARS-CoV-2 RNA is generally detectable in upper respiratoy specimens during the acute phase of infection. The lowest concentration of SARS-CoV-2 viral copies this assay can detect is 131 copies/mL. A negative result does not preclude SARS-Cov-2 infection and should not be used as the sole basis for treatment  or other patient management decisions. A negative result may occur with  improper specimen collection/handling, submission of specimen other than nasopharyngeal swab, presence of viral mutation(s) within the areas targeted by this assay, and inadequate number of viral copies (<131 copies/mL). A negative result must be combined with clinical observations, patient history, and epidemiological information. The expected result is Negative.  Fact Sheet for Patients:  PinkCheek.be  Fact Sheet for Healthcare Providers:  GravelBags.it  This test is no t yet approved or cleared by the [place] FDA and  has been authorized for detection and/or diagnosis of SARS-CoV-2 by FDA under an Emergency Use Authorization (EUA). This EUA will remain  in effect (meaning this test can be used) for the duration of the COVID-19 declaration under Section 564(b)(1) of the Act, 21 U.S.C. section 360bbb-3(b)(1), unless the authorization is terminated or revoked sooner.     Influenza A by PCR NEGATIVE NEGATIVE Final   Influenza B by PCR NEGATIVE NEGATIVE Final    Comment: (NOTE) The Xpert Xpress SARS-CoV-2/FLU/RSV assay is intended as an aid in  the diagnosis of influenza from Nasopharyngeal swab specimens and  should not be used as a sole basis for treatment. Nasal washings and  aspirates are unacceptable for Xpert Xpress SARS-CoV-2/FLU/RSV  testing.  Fact Sheet for Patients: PinkCheek.be  Fact Sheet for Healthcare Providers: GravelBags.it  This test is not yet approved or cleared by the [place] FDA and  has been authorized for detection and/or diagnosis of SARS-CoV-2 by  FDA under an Emergency Use Authorization (EUA). This EUA will remain  in effect (meaning this test can be used) for the duration of the  Covid-19 declaration under Section 564(b)(1) of the Act, 21  U.S.C.  section 360bbb-3(b)(1), unless the authorization is  terminated or revoked. Performed at [facility], [place] [address], [place], [place] [address]     Medical History: Past Medical History:  Diagnosis Date  . Anxiety   . Arthritis   . Aspiration pneumonia ([place])   . Bronchitis   . 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 Chronic neck pain    sees Dr. Debarah Crape and Dondra Prader NP at [facility] in [place]   . Depression   . Difficult intubation   . GERD (gastroesophageal reflux disease)   . Headache(784.0)   . Hiatal hernia   . Ovarian cyst   . Pneumonia    hx x4  . PONV (postoperative nausea and vomiting) 08/06/11  . Post traumatic stress disorder (PTSD)   . Pre-diabetes   . Respiratory failure [facility])     Assessment:  64 yr female admitted with reported intentional oxycodone overdose  Urine drug screen + opiates, benzodiazepines, THC  Initial APAP level = [place], [place] contacted [facility] for treatment recommendations  Goal of Therapy:  APAP < 10  Plan:   Acetadote loading dose of 150mg /kg over 1 hr followed by infusion @ 15 mg/kg/hr  F/U labs and recommendations per [place], Toribio Harbour, PharmD 07/24/2020,2:33 AM

## 2020-07-24 NOTE — Progress Notes (Signed)
eLink Physician-Brief Progress Note Patient Name: [name] DOB: 30-Nov-1955 MRN: [id number]   Date of Service  07/24/2020  HPI/Events of Note  Patient admitted with intentional overdose of 42 tablets of Percocet in a suicide bib, she has a history of depression and anxiety, patient is on Narcan and N-acetylcysteine infusions, suicide precautions, and a psychiatric evaluation is scheduled for the a.m.  eICU Interventions  New Patient Evaluation completed.        Kerry Kass Marinell Igarashi 07/24/2020, 5:34 AM

## 2020-07-24 NOTE — Progress Notes (Signed)
MEDICATION RELATED CONSULT NOTE - Follow up  Pharmacy Consult for Acetadote Indication: overdose  Allergies  Allergen Reactions  . Hydrocodone Itching    Severe itching - can take with benadryl  Severe itching - can take with benadryl   . Hydromorphone Shortness Of Breath and Other (See Comments)    Respiratory distress Other reaction(s): Respiratory Distress Respiratory distress Patient became unresponsive  Respiratory distress   . Meperidine Itching  . Demerol   . Meperidine Hcl Itching    REACTION: hives, itich    Patient Measurements: Height: 5\' 1"  (154.9 cm) Weight: 95 kg (209 lb 7 oz) IBW/kg (Calculated) : 47.8   Vital Signs: Temp: 98.3 F (36.8 C) (11/12 1030) Temp Source: Oral (11/12 1030) BP: 137/65 (11/12 1200) Pulse Rate: 91 (11/12 1200) Intake/Output from previous day: 11/11 0701 - 11/12 0700 In: 2140.4 [I.V.:140.4; IV Piggyback:2000] Out: -  Intake/Output from this shift: Total I/O In: 98.1 [P.O.:98.1] Out: -   Labs: Recent Labs    07/23/20 2219 07/24/20 0537  WBC 9.6 7.7  HGB 14.7 12.7  HCT 47.5* 39.3  PLT 191 183  CREATININE 1.02* 0.77  ALBUMIN 3.9 3.4*  PROT 7.1 6.1*  AST 28 23  ALT 20 19  ALKPHOS 61 47  BILITOT 0.4 0.3   Estimated Creatinine Clearance: 76.8 mL/min (by C-G formula based on SCr of 0.77 mg/dL).  Assessment: 64 yr female admitted with reported intentional oxycodone overdose.  Urine drug screen + opiates, benzodiazepines, THC  Salicylate level < 7  Initial APAP level = 109 > 103  LFTs WNL  INR 1.2  Goal of Therapy:  APAP < 10  Plan:   Acetadote loading dose of 150mg /kg over 1 hr followed by infusion @ 15 mg/kg/hr  F/U labs at 22 hours into therapy and recommendations per [place] PharmD, BCPS Clinical Pharmacist [facility] main pharmacy [phone] 07/24/2020 1:25 PM

## 2020-07-24 NOTE — H&P (Signed)
NAME:  [name], MRN:  [id number], DOB:  05-May-1956, LOS: 0 ADMISSION DATE:  07/23/2020, CONSULTATION DATE: 07/24/2020 REFERRING MD: Fredia Sorrow MD, CHIEF COMPLAINT: Overdose  Brief History   64 year old with history of depression, GERD, PTSD Admitted with intentional overdose of 42 tablets of Percocet 5-325. EMS report pt sent a suicidal text to someone and they notified PD.  Initially responded to IV Narcan and then placed on Narcan drip.  Poison control called and NAC infusion recommended.  PCCM consulted for admission.  Past Medical History    has a past medical history of Anxiety, Arthritis, Aspiration pneumonia ([place]), Bronchitis, Cancer ([place]), Chronic neck pain, Depression, Difficult intubation, GERD (gastroesophageal reflux disease), Headache(784.0), Hiatal hernia, Ovarian cyst, Pneumonia, PONV (postoperative nausea and vomiting) (08/06/11), Post traumatic stress disorder (PTSD), Pre-diabetes, and Respiratory failure ([place]).  Significant Hospital Events   11/12-admit  Consults:  PCCM  Procedures:    Significant Diagnostic Tests:    Micro Data:    Antimicrobials:    Interim history/subjective:    Objective   Blood pressure 93/60, pulse 87, temperature (!) 96.8 F (36 C), temperature source Rectal, resp. rate 14, height 5\' 1"  (1.549 m), weight 95 kg, SpO2 92 %.        Intake/Output Summary (Last 24 hours) at 07/24/2020 0238 Last data filed at 07/24/2020 0140 Gross per 24 hour  Intake 2000 ml  Output --  Net 2000 ml   Filed Weights   07/23/20 2215  Weight: 95 kg    Examination: Gen:      No acute distress HEENT:  EOMI, sclera anicteric Neck:     No masses; no thyromegaly Lungs:    Clear to auscultation bilaterally; normal respiratory effort CV:         Regular rate and rhythm; no murmurs Abd:      + bowel sounds; soft, non-tender; no palpable masses, no distension Ext:    No edema; adequate peripheral perfusion Skin:      Warm and dry; no  rash Neuro: alert and oriented x 3 Psych: normal mood and affect  Resolved Hospital Problem list     Assessment & Plan:  Percocet overdose, suicide attempt Currently protecting airway.  LFTs are normal Continue Narcan drip and N-acetylcysteine drip Follow-up Tylenol levels, labs Admit to ICU Psychiatry consult in a.m.  Best practice:  Diet: NPO Pain/Anxiety/Delirium protocol (if indicated): NA VAP protocol (if indicated): NA DVT prophylaxis: Lovenox GI prophylaxis: NA Glucose control: Monitor Mobility: Bed Code Status: Full Family Communication: Pending Disposition: ICU  Labs   CBC: Recent Labs  Lab 07/23/20 2219  WBC 9.6  NEUTROABS 7.8*  HGB 14.7  HCT 47.5*  MCV 99.8  PLT 671    Basic Metabolic Panel: Recent Labs  Lab 07/23/20 2219  NA 146*  K 4.3  CL 110  CO2 20*  GLUCOSE 226*  BUN 10  CREATININE 1.02*  CALCIUM 8.7*   GFR: Estimated Creatinine Clearance: 60.2 mL/min (A) (by C-G formula based on SCr of 1.02 mg/dL (H)). Recent Labs  Lab 07/23/20 2219  WBC 9.6    Liver Function Tests: Recent Labs  Lab 07/23/20 2219  AST 28  ALT 20  ALKPHOS 61  BILITOT 0.4  PROT 7.1  ALBUMIN 3.9   No results for input(s): LIPASE, AMYLASE in the last 168 hours. No results for input(s): AMMONIA in the last 168 hours.  ABG    Component Value Date/Time   PHART 7.469 (H) 08/21/2011 1755   PCO2ART 36.7 08/21/2011 1755  PO2ART 69.6 (L) 08/21/2011 1755   HCO3 26.3 (H) 08/21/2011 1755   TCO2 24.6 08/21/2011 1755   ACIDBASEDEF 0.6 08/07/2011 2229   O2SAT 94.5 08/21/2011 1755     Coagulation Profile: No results for input(s): INR, PROTIME in the last 168 hours.  Cardiac Enzymes: No results for input(s): CKTOTAL, CKMB, CKMBINDEX, TROPONINI in the last 168 hours.  HbA1C: Hgb A1c MFr Bld  Date/Time Value Ref Range Status  11/21/2018 01:55 PM 6.0 4.6 - 6.5 % Final    Comment:    Glycemic Control Guidelines for People with Diabetes:Non Diabetic:  <6%Goal  of Therapy: <7%Additional Action Suggested:  >8%   06/13/2017 02:31 PM 6.1 4.6 - 6.5 % Final    Comment:    Glycemic Control Guidelines for People with Diabetes:Non Diabetic:  <6%Goal of Therapy: <7%Additional Action Suggested:  >8%     CBG: Recent Labs  Lab 07/23/20 2241  GLUCAP 170*    Review of Systems:   Unable to obtain due to altered mental status  Past Medical History  She,  has a past medical history of Anxiety, Arthritis, Aspiration pneumonia ([place]), Bronchitis, Cancer ([place]), Chronic neck pain, Depression, Difficult intubation, GERD (gastroesophageal reflux disease), Headache(784.0), Hiatal hernia, Ovarian cyst, Pneumonia, PONV (postoperative nausea and vomiting) (08/06/11), Post traumatic stress disorder (PTSD), Pre-diabetes, and Respiratory failure ([place]).   Surgical History    Past Surgical History:  Procedure Laterality Date  . ABDOMINAL HYSTERECTOMY  88  . CESAREAN SECTION  87  . COLONOSCOPY  Dec. 2014    per Dr. Cristina Gong, 2 benign polyps, repeat in 10 yrs   . DIRECT LARYNGOSCOPY  08/19/2011   Procedure: DIRECT LARYNGOSCOPY;  Surgeon: Onnie Graham;  Location: [facility];  Service: ENT;  Laterality: N/A;  . KNEE ARTHROSCOPY WITH SUBCHONDROPLASTY Right 04/02/2019   Procedure: Right knee arthroscopy with chondroplasty and medial tibial plateau subchondroplasty;  Surgeon: Nicholes Stairs, MD;  Location: [facility];  Service: Orthopedics;  Laterality: Right;  . KNEE SURGERY  14   rt arthroscopy  . ORIF ELBOW FRACTURE  08/06/2011   Procedure: OPEN REDUCTION INTERNAL FIXATION (ORIF) ELBOW/OLECRANON FRACTURE;  Surgeon: Linna Hoff;  Location: [facility];  Service: Orthopedics;  Laterality: Left;  . OVARIAN CYST REMOVAL  80  . TRACHEOSTOMY TUBE PLACEMENT  08/19/2011   Procedure: TRACHEOSTOMY;  Surgeon: Onnie Graham;  Location: [facility];  Service: ENT;  Laterality: N/A;  tracheoscopy     Social History   reports that she has never smoked. She has never used smokeless tobacco. She  reports current alcohol use. She reports that she does not use drugs.   Family History   Her family history includes Breast cancer in her maternal aunt and mother.   Allergies Allergies  Allergen Reactions  . Hydrocodone Itching    Severe itching - can take with benadryl  Severe itching - can take with benadryl   . Hydromorphone Shortness Of Breath and Other (See Comments)    Respiratory distress Other reaction(s): Respiratory Distress Respiratory distress Patient became unresponsive  Respiratory distress   . Meperidine Itching  . Demerol   . Meperidine Hcl Itching    REACTION: hives, itich     Home Medications  Prior to Admission medications   Medication Sig Start Date End Date Taking? Authorizing Provider  diphenhydrAMINE (BENADRYL) 25 mg capsule Take 25 mg by mouth 4 (four) times daily.    [provider]  HYDROcodone-acetaminophen (NORCO) 10-325 MG tablet Take 1 tablet by mouth 4 (four) times  daily.    [provider]  Olopatadine HCl 0.2 % SOLN Apply 1 drop to eye daily. 1 drop in both eyes once daily Patient taking differently: Place 1 drop into both eyes daily as needed (EYE IRRITATION).  11/21/18   Laurey Morale, MD  ondansetron (ZOFRAN ODT) 4 MG disintegrating tablet Take 1 tablet (4 mg total) by mouth every 8 (eight) hours as needed. 04/02/19   Nicholes Stairs, MD  SUMAtriptan (IMITREX) 100 MG tablet Take 1 tablet (100 mg total) by mouth as needed for migraine. May repeat in 2 hours if headache persists or recurs. 07/07/15   Laurey Morale, MD     Critical care time:    The patient is critically ill with multiple organ system failure and requires high complexity decision making for assessment and support, frequent evaluation and titration of therapies, advanced monitoring, review of radiographic studies and interpretation of complex data.   Critical Care Time devoted to patient care services, exclusive of separately billable procedures,  described in this note is 45 minutes.   Marshell Garfinkel MD [facility] Please see [email or web address] for pager details.  07/24/2020, 2:45 AM

## 2020-07-24 NOTE — Consult Note (Signed)
[facility] Consult   Reason for Consult:  overdose Referring Physician:  Dr. Kara Mead Patient Identification: [name] MRN:  [id number] Principal Diagnosis: Intentional opiate overdose [facility]) Diagnosis:  Principal Problem:   Intentional opiate overdose ([place]) Active Problems:   Drug overdose   Total Time spent with patient: 45 minutes  Subjective:   [name] is a 64 y.o. female patient admitted with history of depression, GERD, PTSD with intentional overdose of 42 tablets of Percocet 01/13/2024.  As per EMS she sent a suicidal text to someone and he notified the police department.  While in the emergency room it was documented that she expressed suicidal ideations " just let me die"  and stated that she in fact overdose on 42 tablets.  Patient is alert and oriented on evaluation, appears to be talking on the phone upon entry of the room.  Patient reports she has had worsening adverse reactions secondary to opiate medication for the past 8 years.  She reports she hurt her back while moving her daughter's merchandise " and I took 2 pain pills.  I know I am supposed to take Benadryl with them but I did not.  And then apparently took 2 more with some alcoholic beverages and I do not remember what happened after that."  When inquiring about suicidal attempt she denied and states" I told my daughter I cannot take too much more this.  We had gotten into an argument about her merchandise.  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  She is famous now and I do not think she understood how much was required.  The house is worth 300,000 dollars and if nothing else her children will have somewhere to stay. "  Patient denies any previous suicide attempt.  She denies any previous inpatient admission.  She denies any illicit substances and or legal charges.    HPI: 64 year old with history of depression, GERD, PTSD Admitted with intentional overdose of 42  tablets ofPercocet 5-325. EMS report pt sent a suicidal text to someone and they notified PD.  Initially responded to IV Narcan and then placed on Narcan drip.  Poison control called and NAC infusion recommended.  PCCM consulted for admission.  During the evaluation patient was alert and oriented x4, maintaining good eye contact and engaged well throughout the evaluation.  Patient presented with tangential speech, flight of ideas, and her story was not consistent.  There was several gaps to her story, and she did not appear to be forthcoming with speaking with relatives.  She denied on several occasions obtaining collateral information.  Patient also denied this as a suicide attempt, however there were several reports from EMS and nursing staff which she expressed suicidal ideation and suicidal statements" just let me die."  She also denies any illicit substances however her urine drug screen was positive for benzodiazepines, opiates (in which she is prescribed), and THC.  Her blood alcohol level on admission was 158.  According to the [facility] she is prescribed oxycodone acetaminophen 5/325 #120 last filled on 07/01/20.  At the time of this evaluation she denies any current suicidal ideations.  Collateral obtained from her daughter Mishayla Sliwinski:  "she doesn't talk to me much anymore. She started talking to that man. I didn't know she was having issues, because I tried to stay away from that whole. We all grew up with mental illness, but our parents never discussed so we suffered in silence. We had to  find ways to deal with it. "  The information she provided was noncontributory with the exception of what is noted above.  She does provide a phone number for her other sister Janett Billow " who is famous and does tiktok.  Mom handles most of her merchandise.  So they talk much more often than we do."  Writer attempted to contact Jessica at phone number [phone], however unsuccessful in voicemail not identified.      Past Psychiatric History: Patient denies.  However her chart review shows diagnosis of depression and PTSD.  She denies any history of outpatient therapy, with the exception of grief and loss therapy when her husband died 33 years ago.  She denies any history of inpatient admission.  She denies any previous suicidal attempt, suicidal ideations, and or suicidal gestures.  She denies any history of nonsuicidal self-injurious behavior.  Risk to Self:  Yes Risk to Others:  No Prior Inpatient Therapy:  Denies Prior Outpatient Therapy:  Denies  Past Medical History:  Past Medical History:  Diagnosis Date  . Anxiety   . Arthritis   . Aspiration pneumonia ([place])   . Bronchitis   . 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 Chronic neck pain    sees Dr. Debarah Crape and Dondra Prader NP at [facility] in [place]   . Depression   . Difficult intubation   . GERD (gastroesophageal reflux disease)   . Headache(784.0)   . Hiatal hernia   . Ovarian cyst   . Pneumonia    hx x4  . PONV (postoperative nausea and vomiting) 08/06/11  . Post traumatic stress disorder (PTSD)   . Pre-diabetes   . Respiratory failure [facility])     Past Surgical History:  Procedure Laterality Date  . ABDOMINAL HYSTERECTOMY  88  . CESAREAN SECTION  87  . COLONOSCOPY  Dec. 2014    per Dr. Cristina Gong, 2 benign polyps, repeat in 10 yrs   . DIRECT LARYNGOSCOPY  08/19/2011   Procedure: DIRECT LARYNGOSCOPY;  Surgeon: Onnie Graham;  Location: [facility];  Service: ENT;  Laterality: N/A;  . KNEE ARTHROSCOPY WITH SUBCHONDROPLASTY Right 04/02/2019   Procedure: Right knee arthroscopy with chondroplasty and medial tibial plateau subchondroplasty;  Surgeon: Nicholes Stairs, MD;  Location: [facility];  Service: Orthopedics;  Laterality: Right;  . KNEE SURGERY  14   rt arthroscopy  . ORIF ELBOW FRACTURE  08/06/2011   Procedure: OPEN REDUCTION INTERNAL FIXATION (ORIF) ELBOW/OLECRANON FRACTURE;  Surgeon: Linna Hoff;   Location: [facility];  Service: Orthopedics;  Laterality: Left;  . OVARIAN CYST REMOVAL  80  . TRACHEOSTOMY TUBE PLACEMENT  08/19/2011   Procedure: TRACHEOSTOMY;  Surgeon: Onnie Graham;  Location: [facility];  Service: ENT;  Laterality: N/A;  tracheoscopy   Family History:  Family History  Problem Relation Age of Onset  . Breast cancer Mother   . Breast cancer Maternal Aunt    Family Psychiatric  History: As per patient her father committed suicide by carbon monoxide poisoning.  She does not wish to elaborate his means, however she does report history of alcohol use with no previous psychiatric diagnosis.  As per her daughter Kennyth Lose there is a significant family history of mental illness that was avoided throughout her lifetime.  Social History:  Social History   Substance and Sexual Activity  Alcohol Use Yes  . Alcohol/week: 0.0 standard drinks   Comment: occ     Social History   Substance and  Sexual Activity  Drug Use No    Social History   Socioeconomic History  . Marital status: Widowed    Spouse name: Not on file  . Number of children: Not on file  . Years of education: Not on file  . Highest education level: Not on file  Occupational History  . Not on file  Tobacco Use  . Smoking status: Never Smoker  . Smokeless tobacco: Never Used  Vaping Use  . Vaping Use: Never used  Substance and Sexual Activity  . Alcohol use: Yes    Alcohol/week: 0.0 standard drinks    Comment: occ  . Drug use: No  . Sexual activity: Yes    Birth control/protection: Post-menopausal  Other Topics Concern  . Not on file  Social History Narrative  . Not on file   Social Determinants of Health   Financial Resource Strain:   . Difficulty of Paying Living Expenses: Not on file  Food Insecurity:   . Worried About Charity fundraiser in the Last Year: Not on file  . Ran Out of Food in the Last Year: Not on file  Transportation Needs:   . Lack of Transportation (Medical): Not on file  . Lack  of Transportation (Non-Medical): Not on file  Physical Activity:   . Days of Exercise per Week: Not on file  . Minutes of Exercise per Session: Not on file  Stress:   . Feeling of Stress : Not on file  Social Connections:   . Frequency of Communication with Friends and Family: Not on file  . Frequency of Social Gatherings with Friends and Family: Not on file  . Attends Religious Services: Not on file  . Active Member of Clubs or Organizations: Not on file  . Attends Archivist Meetings: Not on file  . Marital Status: Not on file   Additional Social History:    Allergies:   Allergies  Allergen Reactions  . Hydrocodone Itching    Severe itching - can take with benadryl  Severe itching - can take with benadryl   . Hydromorphone Shortness Of Breath and Other (See Comments)    Respiratory distress Other reaction(s): Respiratory Distress Respiratory distress Patient became unresponsive  Respiratory distress   . Meperidine Itching  . Demerol   . Meperidine Hcl Itching    REACTION: hives, itich    Labs:  Results for orders placed or performed during the hospital encounter of 07/23/20 (from the past 48 hour(s))  Comprehensive metabolic panel     Status: Abnormal   Collection Time: 07/23/20 10:19 PM  Result Value Ref Range   Sodium 146 (H) 135 - 145 mmol/L   Potassium 4.3 3.5 - 5.1 mmol/L   Chloride 110 98 - 111 mmol/L   CO2 20 (L) 22 - 32 mmol/L   Glucose, Bld 226 (H) 70 - 99 mg/dL    Comment: Glucose reference range applies only to samples taken after fasting for at least 8 hours.   BUN 10 8 - 23 mg/dL   Creatinine, Ser 1.02 (H) 0.44 - 1.00 mg/dL   Calcium 8.7 (L) 8.9 - 10.3 mg/dL   Total Protein 7.1 6.5 - 8.1 g/dL   Albumin 3.9 3.5 - 5.0 g/dL   AST 28 15 - 41 U/L   ALT 20 0 - 44 U/L   Alkaline Phosphatase 61 38 - 126 U/L   Total Bilirubin 0.4 0.3 - 1.2 mg/dL   GFR, Estimated >60 >60 mL/min    Comment: (NOTE)  Calculated using the CKD-EPI Creatinine Equation  (2021)    Anion gap 16 (H) 5 - 15    Comment: Performed at [facility], [place] [address], [place], [place] [address]  Salicylate level     Status: Abnormal   Collection Time: 07/23/20 10:19 PM  Result Value Ref Range   Salicylate Lvl <5.0 (L) 7.0 - 30.0 mg/dL    Comment: Performed at [facility], [place] [address] [place], [place] [address]  Acetaminophen level     Status: Abnormal   Collection Time: 07/23/20 10:19 PM  Result Value Ref Range   Acetaminophen (Tylenol), Serum 109 (H) 10 - 30 ug/mL    Comment: (NOTE) Therapeutic concentrations vary significantly. A range of 10-30 ug/mL  may be an effective concentration for many patients. However, some  are best treated at concentrations outside of this range. Acetaminophen concentrations >150 ug/mL at 4 hours after ingestion  and >50 ug/mL at 12 hours after ingestion are often associated with  toxic reactions.  Performed at [facility], [place] [address] [place], [place] [address]   Ethanol     Status: Abnormal   Collection Time: 07/23/20 10:19 PM  Result Value Ref Range   Alcohol, Ethyl (B) 158 (H) <10 mg/dL    Comment: (NOTE) Lowest detectable limit for serum alcohol is 10 mg/dL.  For medical purposes only. Performed at [facility], [place] [address], [place], [place] [address]   CBC WITH DIFFERENTIAL     Status: Abnormal   Collection Time: 07/23/20 10:19 PM  Result Value Ref Range   WBC 9.6 4.0 - 10.5 K/uL   RBC 4.76 3.87 - 5.11 MIL/uL   Hemoglobin 14.7 12.0 - 15.0 g/dL   HCT 47.5 (H) 36 - 46 %   MCV 99.8 80.0 - 100.0 fL   MCH 30.9 26.0 - 34.0 pg   MCHC 30.9 30.0 - 36.0 g/dL   RDW 13.3 11.5 - 15.5 %   Platelets 191 150 - 400 K/uL   nRBC 0.0 0.0 - 0.2 %   Neutrophils Relative % 81 %   Neutro Abs 7.8 (H) 1.7 - 7.7 K/uL   Lymphocytes Relative 14 %   Lymphs Abs 1.4 0.7 - 4.0 K/uL   Monocytes Relative 4 %   Monocytes Absolute 0.4 0.1 - 1.0 K/uL    Eosinophils Relative 0 %   Eosinophils Absolute 0.0 0.0 - 0.5 K/uL   Basophils Relative 0 %   Basophils Absolute 0.0 0.0 - 0.1 K/uL   Immature Granulocytes 1 %   Abs Immature Granulocytes 0.06 0.00 - 0.07 K/uL    Comment: Performed at [facility], [place] [address], [place], [place] [address]  Respiratory Panel by RT PCR (Flu A&B, Covid) - Nasopharyngeal Swab     Status: None   Collection Time: 07/23/20 10:19 PM   Specimen: Nasopharyngeal Swab  Result Value Ref Range   SARS Coronavirus 2 by RT PCR NEGATIVE NEGATIVE    Comment: (NOTE) SARS-CoV-2 target nucleic acids are NOT DETECTED.  The SARS-CoV-2 RNA is generally detectable in upper respiratoy specimens during the acute phase of infection. The lowest concentration of SARS-CoV-2 viral copies this assay can detect is 131 copies/mL. A negative result does not preclude SARS-Cov-2 infection and should not be used as the sole basis for treatment or other patient management decisions. A negative result may occur with  improper specimen collection/handling, submission of specimen other than nasopharyngeal swab, presence of viral mutation(s) within  the areas targeted by this assay, and inadequate number of viral copies (<131 copies/mL). A negative result must be combined with clinical observations, patient history, and epidemiological information. The expected result is Negative.  Fact Sheet for Patients:  PinkCheek.be  Fact Sheet for Healthcare Providers:  GravelBags.it  This test is no t yet approved or cleared by the [place] FDA and  has been authorized for detection and/or diagnosis of SARS-CoV-2 by FDA under an Emergency Use Authorization (EUA). This EUA will remain  in effect (meaning this test can be used) for the duration of the COVID-19 declaration under Section 564(b)(1) of the Act, 21 U.S.C. section 360bbb-3(b)(1), unless the authorization is  terminated or revoked sooner.     Influenza A by PCR NEGATIVE NEGATIVE   Influenza B by PCR NEGATIVE NEGATIVE    Comment: (NOTE) The Xpert Xpress SARS-CoV-2/FLU/RSV assay is intended as an aid in  the diagnosis of influenza from Nasopharyngeal swab specimens and  should not be used as a sole basis for treatment. Nasal washings and  aspirates are unacceptable for Xpert Xpress SARS-CoV-2/FLU/RSV  testing.  Fact Sheet for Patients: PinkCheek.be  Fact Sheet for Healthcare Providers: GravelBags.it  This test is not yet approved or cleared by the [place] FDA and  has been authorized for detection and/or diagnosis of SARS-CoV-2 by  FDA under an Emergency Use Authorization (EUA). This EUA will remain  in effect (meaning this test can be used) for the duration of the  Covid-19 declaration under Section 564(b)(1) of the Act, 21  U.S.C. section 360bbb-3(b)(1), unless the authorization is  terminated or revoked. Performed at [facility], [place] [address], [place], [place] [address]   CBG monitoring, ED     Status: Abnormal   Collection Time: 07/23/20 10:41 PM  Result Value Ref Range   Glucose-Capillary 170 (H) 70 - 99 mg/dL    Comment: Glucose reference range applies only to samples taken after fasting for at least 8 hours.  Urine rapid drug screen (hosp performed)     Status: Abnormal   Collection Time: 07/24/20 12:26 AM  Result Value Ref Range   Opiates POSITIVE (A) NONE DETECTED   Cocaine NONE DETECTED NONE DETECTED   Benzodiazepines POSITIVE (A) NONE DETECTED   Amphetamines NONE DETECTED NONE DETECTED   Tetrahydrocannabinol POSITIVE (A) NONE DETECTED   Barbiturates NONE DETECTED NONE DETECTED    Comment: (NOTE) DRUG SCREEN FOR MEDICAL PURPOSES ONLY.  IF CONFIRMATION IS NEEDED FOR ANY PURPOSE, NOTIFY LAB WITHIN 5 DAYS.  LOWEST DETECTABLE LIMITS FOR URINE DRUG SCREEN Drug Class                      Cutoff (ng/mL) Amphetamine and metabolites    1000 Barbiturate and metabolites    200 Benzodiazepine                 546 Tricyclics and metabolites     300 Opiates and metabolites        300 Cocaine and metabolites        300 THC                            50 Performed at [facility], [place] [address] [place], [place] [address]   Salicylate level     Status: Abnormal   Collection Time: 07/24/20 12:33 AM  Result Value Ref Range   Salicylate Lvl <7.5 (L) 7.0 - 30.0 mg/dL  Comment: Performed at [facility], [place] [address], [place], [place] [address]  Acetaminophen level     Status: Abnormal   Collection Time: 07/24/20 12:33 AM  Result Value Ref Range   Acetaminophen (Tylenol), Serum 103 (H) 10 - 30 ug/mL    Comment: (NOTE) Therapeutic concentrations vary significantly. A range of 10-30 ug/mL  may be an effective concentration for many patients. However, some  are best treated at concentrations outside of this range. Acetaminophen concentrations >150 ug/mL at 4 hours after ingestion  and >50 ug/mL at 12 hours after ingestion are often associated with  toxic reactions.  Performed at [facility], [place] [address], [place], [place] [address]   MRSA PCR Screening     Status: None   Collection Time: 07/24/20  5:26 AM   Specimen: Nasopharyngeal  Result Value Ref Range   MRSA by PCR NEGATIVE NEGATIVE    Comment:        The GeneXpert MRSA Assay (FDA approved for NASAL specimens only), is one component of a comprehensive MRSA colonization surveillance program. It is not intended to diagnose MRSA infection nor to guide or monitor treatment for MRSA infections. Performed at [facility], [place] [address] [place], [place] [address]   HIV Antibody (routine testing w rflx)     Status: None   Collection Time: 07/24/20  5:37 AM  Result Value Ref Range   HIV Screen 4th Generation wRfx Non Reactive Non Reactive     Comment: Performed at [facility], [place] [address] [place], [place] [address]  Comprehensive metabolic panel     Status: Abnormal   Collection Time: 07/24/20  5:37 AM  Result Value Ref Range   Sodium 144 135 - 145 mmol/L   Potassium 4.0 3.5 - 5.1 mmol/L   Chloride 113 (H) 98 - 111 mmol/L   CO2 20 (L) 22 - 32 mmol/L   Glucose, Bld 116 (H) 70 - 99 mg/dL    Comment: Glucose reference range applies only to samples taken after fasting for at least 8 hours.   BUN 9 8 - 23 mg/dL   Creatinine, Ser 0.77 0.44 - 1.00 mg/dL   Calcium 7.9 (L) 8.9 - 10.3 mg/dL   Total Protein 6.1 (L) 6.5 - 8.1 g/dL   Albumin 3.4 (L) 3.5 - 5.0 g/dL   AST 23 15 - 41 U/L   ALT 19 0 - 44 U/L   Alkaline Phosphatase 47 38 - 126 U/L   Total Bilirubin 0.3 0.3 - 1.2 mg/dL   GFR, Estimated >60 >60 mL/min    Comment: (NOTE) Calculated using the CKD-EPI Creatinine Equation (2021)    Anion gap 11 5 - 15    Comment: Performed at [facility], [place] [address], [place], [place] [address]  CBC     Status: None   Collection Time: 07/24/20  5:37 AM  Result Value Ref Range   WBC 7.7 4.0 - 10.5 K/uL   RBC 4.11 3.87 - 5.11 MIL/uL   Hemoglobin 12.7 12.0 - 15.0 g/dL   HCT 39.3 36 - 46 %   MCV 95.6 80.0 - 100.0 fL   MCH 30.9 26.0 - 34.0 pg   MCHC 32.3 30.0 - 36.0 g/dL   RDW 13.5 11.5 - 15.5 %   Platelets 183 150 - 400 K/uL   nRBC 0.0 0.0 - 0.2 %    Comment: Performed at [facility], [place] [address], [place], [place] [address]  Protime-INR     Status: None   Collection Time: 07/24/20  5:37 AM  Result Value Ref Range   Prothrombin Time 14.5 11.4 - 15.2 seconds   INR 1.2 0.8 - 1.2    Comment: (NOTE) INR goal varies based on device and disease states. Performed at [facility], [place] [address], [place], [place] [address]     Current Facility-Administered Medications  Medication Dose Route Frequency Provider Last Rate Last Admin  . acetylcysteine (ACETADOTE)  24,000 mg in dextrose 5 % 600 mL (40 mg/mL) infusion  15 mg/kg/hr Intravenous Continuous Montine Circle, PA-C 35.6 mL/hr at 07/24/20 1112 15 mg/kg/hr at 07/24/20 1112  . Chlorhexidine Gluconate Cloth 2 % PADS 6 each  6 each Topical Daily Marshell Garfinkel, MD   6 each at 07/24/20 0933  . docusate sodium (COLACE) capsule 100 mg  100 mg Oral BID PRN Mannam, Praveen, MD      . enoxaparin (LOVENOX) injection 40 mg  40 mg Subcutaneous Q24H Mannam, Praveen, MD      . Derrill Memo ON 07/25/2020] influenza vac split quadrivalent PF (FLUARIX) injection 0.5 mL  0.5 mL Intramuscular Tomorrow-1000 Kara Mead V, MD      . MEDLINE mouth rinse  15 mL Mouth Rinse BID Mannam, Praveen, MD      . naloxone (NARCAN) injection 0.4 mg  0.4 mg Intravenous PRN Montine Circle, PA-C      . ondansetron (ZOFRAN) injection 4 mg  4 mg Intravenous Once Montine Circle, PA-C      . ondansetron [facility]) injection 4 mg  4 mg Intravenous Q6H PRN Mannam, Praveen, MD   4 mg at 07/24/20 0450  . polyethylene glycol (MIRALAX / GLYCOLAX) packet 17 g  17 g Oral Daily PRN Mannam, Praveen, MD        Musculoskeletal: Strength & Muscle Tone: within normal limits Gait & Station: normal Patient leans: N/A  Psychiatric Specialty Exam: Physical Exam  Review of Systems  Blood pressure 137/65, pulse 91, temperature 98.3 F (36.8 C), temperature source Oral, resp. rate 15, height 5\' 1"  (1.549 m), weight 95 kg, SpO2 98 %.Body mass index is 39.57 kg/m.  General Appearance: Disheveled  Eye Contact:  Good  Speech:  Pressured  Volume:  Increased  Mood:  Euphoric  Affect:  Labile and Full Range  Thought Process:  Coherent, Linear and Descriptions of Associations: Tangential  Orientation:  Full (Time, Place, and Person)  Thought Content:  Logical, Rumination and Tangential  Suicidal Thoughts:  She denies any current.  Although she did attempt less than 24 hours ago.  Homicidal Thoughts:  No  Memory:  Immediate;   Good Recent;   Good Remote;    Fair  Judgement:  Fair  Insight:  Shallow  Psychomotor Activity:  Normal  Concentration:  Concentration: Fair and Attention Span: Fair  Recall:  [employer] of Knowledge:  Fair  Language:  Fair  Akathisia:  No  Handed:  Right  AIMS (if indicated):     Assets:  Communication Skills Desire for Improvement Financial Resources/Insurance Housing Intimacy Leisure Time Physical Health Social Support  ADL's:  Intact  Cognition:  WNL  Sleep:     Chena Chohan is a 64 year old female with history of depression, PTSD, chronic back pain who presented to the emergency room after intentional overdose of 42 oxycodone acetaminophen tablets.  Patient's history continues to waver, as she minimizes her symptoms of depression and recent suicide attempt.  She reported that she only took 4 pills  and had alcohol to drink, however she required significant medical intervention that suggested this was a significant overdose.  Patient denies this as a suicide attempt, however it is documented by several staff including EMS that she in fact attempted suicide.  Patient with current presentation of mood lability to include crying, laughing, irritable and evidence of rapid cycling during the evaluation.  She does not provide consent to obtain collateral from her loved ones, writer did contact daughter listed in chart who provided additional information that was noncontributory.  Considering patient's ongoing risk factors as documented above will recommend inpatient at this time.    Treatment Plan Summary: Plan Will benefit from inpatient psychiatric admission once medically cleared.  At this time patient is voluntarily however it is documented that she has made several attempts to elope will recommend IVC if she attempts to leave.  Patient has increased risk factors for suicide completion to include age, family history, substance abuse, and suggestion of giving away her belongings.  Disposition: Recommend  psychiatric Inpatient admission when medically cleared.  -Psychiatry to sign off at this time.  -If patient attempts to leave will recommend IVC.    Suella Broad, FNP 07/24/2020 3:22 PM

## 2020-07-24 NOTE — ED Notes (Signed)
Report given to [facility], Therapist, sports.  SBAR information covered in report.  Accepting nurse had no additional questions.  Pt resting quietly in bed.  Respirations even and unlabored.  NADN.

## 2020-07-24 NOTE — TOC Initial Note (Signed)
Transition of Care [facility]) - Initial/Assessment Note    Patient Details  Name: [name] MRN: [id number] Date of Birth: 05-Mar-1956  Transition of Care [facility]) CM/SW Contact:    Leeroy Cha, RN Phone Number: 07/24/2020, 8:47 AM  Clinical Narrative:                 64 year old with history of depression, GERD, PTSD Admitted with intentional overdose of 42 tablets ofPercocet 5-325. EMS report pt sent a suicidal text to someone and they notified PD.  Initially responded to IV Narcan and then placed on Narcan drip.  Poison control called and NAC infusion recommended. Plan is to return home  Following for progression. Expected Discharge Plan: Home/Self Care Barriers to Discharge: No Barriers Identified   Patient Goals and CMS Choice Patient states their goals for this hospitalization and ongoing recovery are:: to go home CMS Medicare.gov Compare Post Acute Care list provided to:: Patient    Expected Discharge Plan and Services Expected Discharge Plan: Home/Self Care   Discharge Planning Services: CM Consult   Living arrangements for the past 2 months: Single Family Home                                      Prior Living Arrangements/Services Living arrangements for the past 2 months: Single Family Home Lives with:: Minor Children Patient language and need for interpreter reviewed:: Yes Do you feel safe going back to the place where you live?: Yes      Need for Family Participation in Patient Care: Yes (Comment) Care giver support system in place?: Yes (comment)   Criminal Activity/Legal Involvement Pertinent to Current Situation/Hospitalization: No - Comment as needed  Activities of Daily Living      Permission Sought/Granted                  Emotional Assessment   Attitude/Demeanor/Rapport: Apprehensive Affect (typically observed): Agitated Orientation: : Oriented to Place, Oriented to Self, Oriented to  Time, Oriented to Situation Alcohol  / Substance Use: Alcohol Use Psych Involvement: No (comment)  Admission diagnosis:  Drug overdose [T50.901A] Polysubstance abuse ([place]) [F19.10] Hypothermia, initial encounter [T68.XXXA] Intentional drug overdose, initial encounter ([place]) [T50.902A] Suicide attempt by acetaminophen overdose, initial encounter [facility]) [T39.1X2A] Patient Active Problem List   Diagnosis Date Noted  . Drug overdose 07/24/2020  . Acute lateral meniscus tear of right knee 04/02/2019  . Migraines 07/07/2015  . ARF (acute respiratory failure) ([place]) 08/31/2011  . Respiratory failure ([place]) 08/30/2011  . Aspiration pneumonia ([place]) 08/30/2011  . Hypoxia 08/30/2011  . 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 Physical deconditioning 08/30/2011  . Encephalopathy acute 08/30/2011  . Stridor 08/18/2011  . Radial head fracture, closed 08/06/2011    Class: Acute  . ACUTE SINUSITIS, UNSPECIFIED 06/23/2009  . ANXIETY 04/10/2009  . DEPRESSION 04/10/2009  . SHOULDER PAIN, CHRONIC 04/10/2009  . HIP PAIN, CHRONIC 04/10/2009  . NECK PAIN, CHRONIC 04/10/2009   PCP:  Laurey Morale, MD Pharmacy:   [id number] - [place], [address] AT [address] [place] [address] Phone: [phone] Fax: [phone]     Social Determinants of Health (SDOH) Interventions    Readmission Risk Interventions No flowsheet data found.

## 2020-07-24 NOTE — Progress Notes (Signed)
When I entered the room, she was having her phone conversation with her daughter and crying, fianc in the room  She denies suicidal intent or ideations. She said she was in a hotel room because she could not go back to her house. She has a concealed carry permit. She states she took 2 tablets of Percocet for back pain with Benadryl and since pain persisted took some more, unknown quantity  Narcan drip has been turned off this morning, exam -labile affect, clear breath sounds bilateral S1-S2 normal, appears strong in all extremities. Labs normal. UDS positive for THC, benzodiazepines and opiates  Recommend DC Narcan drip. Psych consultation, continue suicide watch. Transfer to floor and to triad 11/13  Kara Mead MD. Shade Flood. [place] [facility] See Amion for pager  If no response to pager , please call [phone]  After 7:00 pm call Elink  [phone]   07/24/2020

## 2020-07-25 DIAGNOSIS — T40602A Poisoning by unspecified narcotics, intentional self-harm, initial encounter: Secondary | ICD-10-CM

## 2020-07-25 LAB — COMPREHENSIVE METABOLIC PANEL
ALT: 17 U/L (ref 0–44)
AST: 18 U/L (ref 15–41)
Albumin: 3.3 g/dL — ABNORMAL LOW (ref 3.5–5.0)
Alkaline Phosphatase: 42 U/L (ref 38–126)
Anion gap: 10 (ref 5–15)
BUN: 9 mg/dL (ref 8–23)
CO2: 24 mmol/L (ref 22–32)
Calcium: 8.7 mg/dL — ABNORMAL LOW (ref 8.9–10.3)
Chloride: 109 mmol/L (ref 98–111)
Creatinine, Ser: 0.88 mg/dL (ref 0.44–1.00)
GFR, Estimated: >60 mL/min (ref >60)
Glucose, Bld: 107 mg/dL — ABNORMAL HIGH (ref 70–99)
Potassium: 3.5 mmol/L (ref 3.5–5.1)
Sodium: 143 mmol/L (ref 135–145)
Total Bilirubin: 0.6 mg/dL (ref 0.3–1.2)
Total Protein: 6.1 g/dL — ABNORMAL LOW (ref 6.5–8.1)

## 2020-07-25 LAB — PROTIME-INR
INR: 1.2 (ref 0.8–1.2)
Prothrombin Time: 14.6 s (ref 11.4–15.2)

## 2020-07-25 LAB — ACETAMINOPHEN LEVEL: Acetaminophen (Tylenol), Serum: <10 ug/mL — ABNORMAL LOW (ref 10–30)

## 2020-07-25 MED ORDER — POTASSIUM CHLORIDE CRYS ER 20 MEQ PO TBCR
40.0000 meq | EXTENDED_RELEASE_TABLET | Freq: Once | ORAL | Status: AC
  Administered 2020-07-25: 40 meq via ORAL
  Filled 2020-07-25: qty 2

## 2020-07-25 MED ORDER — ACETAMINOPHEN 325 MG PO TABS
650.0000 mg | ORAL_TABLET | Freq: Four times a day (QID) | ORAL | Status: AC | PRN
  Administered 2020-07-25 – 2020-07-26 (×2): 650 mg via ORAL
  Filled 2020-07-25 (×2): qty 2

## 2020-07-25 NOTE — Progress Notes (Signed)
MEDICATION RELATED CONSULT NOTE - Follow up  Pharmacy Consult for Acetadote Indication: overdose  Allergies  Allergen Reactions  . Hydrocodone Itching    Severe itching - can take with benadryl  Severe itching - can take with benadryl   . Hydromorphone Shortness Of Breath and Other (See Comments)    Respiratory distress Other reaction(s): Respiratory Distress Respiratory distress Patient became unresponsive  Respiratory distress   . Meperidine Itching  . Demerol   . Meperidine Hcl Itching    REACTION: hives, itich    Patient Measurements: Height: 5\' 1"  (154.9 cm) Weight: 95 kg (209 lb 7 oz) IBW/kg (Calculated) : 47.8   Vital Signs: Temp: 98.2 F (36.8 C) (11/13 0330) Temp Source: Oral (11/13 0330) BP: 116/78 (11/13 0330) Pulse Rate: 74 (11/13 0330) Intake/Output from previous day: 11/12 0701 - 11/13 0700 In: 1109.1 [P.O.:338.1; I.V.:771] Out: -  Intake/Output from this shift: Total I/O In: 383.6 [I.V.:383.6] Out: -   Labs: Recent Labs    07/23/20 2219 07/24/20 0537 07/25/20 0239  WBC 9.6 7.7  --   HGB 14.7 12.7  --   HCT 47.5* 39.3  --   PLT 191 183  --   CREATININE 1.02* 0.77 0.88  ALBUMIN 3.9 3.4* 3.3*  PROT 7.1 6.1* 6.1*  AST 28 23 18   ALT 20 19 17   ALKPHOS 61 47 42  BILITOT 0.4 0.3 0.6   Estimated Creatinine Clearance: 69.8 mL/min (by C-G formula based on SCr of 0.88 mg/dL).  Assessment: 64 yr [name] admitted with reported intentional oxycodone overdose.  Urine drug screen + opiates, benzodiazepines, THC  Salicylate level < 7  Initial APAP level = 109 > 103  LFTs WNL INR 1.2  Labs 22h into Acetadote therapy WNL (APAP level <10, INR unchanged, LFTs WNL, VSS) Discussed case with poison control & hospitalist NP on call.   Goal of Therapy:  APAP < 10  Plan:   D/C Acetadote as recommended by [place] PharmD, BCPS Clinical Pharmacist [facility] main pharmacy [phone] 07/25/2020 4:03 AM

## 2020-07-25 NOTE — Progress Notes (Signed)
PROGRESS NOTE    [name]  MWU:[id number] DOB: 1956/02/08 DOA: 07/23/2020 PCP: Laurey Morale, MD    Chief Complaint  Patient presents with   Intentional Drug Overdose    Brief Narrative:  64 year old with history of depression, GERD, PTSD Admitted with intentional overdose of 42 tablets ofPercocet 5-325. EMS report pt sent a suicidal text to someone and they notified PD.  Initially responded to IV Narcan and then placed on Narcan drip.  Poison control called and NAC infusion recommended.  PCCM consulted for admission.  Subjective:  She is alert, awake and interactive She reports is now aware of the recommendation of going to inpatient psych for treatment, she does not agree with it She requests to talk to psychiatry again  Assessment & Plan:   Principal Problem:   Intentional opiate overdose ([place]) Active Problems:   Drug overdose  Intentional drug overdose -Admitted to ICU received Narcan drip and NAC infusion -She is now off drip, alert, awake and interactive --Continue suicidal precaution, she does not agree with inpatient psych placement, she want to talk to psychiatry again, psych reconsulted     DVT prophylaxis: enoxaparin (LOVENOX) injection 40 mg Start: 07/24/20 1000 SCDs Start: 07/24/20 0300   Code Status:full Family Communication: declined my offer to update her family Disposition:   Status is: Inpatient   Dispo: The patient is from: home              Anticipated d/c is to: Inpatient psych unit              Anticipated d/c date is: To be determined, he is medically stable to discharge to inpatient psych unit                Consultants:   Psychiatry  Procedures:   none  Antimicrobials:   none     Objective: Vitals:   07/24/20 1934 07/24/20 2330 07/25/20 0330 07/25/20 0640  BP: 126/69 116/74 116/78 122/78  Pulse: 84 77 74 88  Resp: 16     Temp: 98.3 F (36.8 C) 98.2 F (36.8 C) 98.2 F (36.8 C) 97.9 F (36.6 C)    TempSrc: Oral Oral Oral Oral  SpO2: 94% 98% 96% 98%  Weight:      Height:        Intake/Output Summary (Last 24 hours) at 07/25/2020 1055 Last data filed at 07/25/2020 0956 Gross per 24 hour  Intake 1381.19 ml  Output --  Net 1381.19 ml   Filed Weights   07/23/20 2215 07/24/20 0500  Weight: 95 kg 95 kg    Examination:  General exam: calm, NAD Respiratory system: Clear to auscultation. Respiratory effort normal. Cardiovascular system: S1 & S2 heard, RRR. No JVD, no murmur, No pedal edema. Gastrointestinal system: Abdomen is nondistended, soft and nontender. Normal bowel sounds heard. Central nervous system: Alert and oriented. No focal neurological deficits. Extremities: Symmetric 5 x 5 power. Skin: No rashes, lesions or ulcers Psychiatry: Judgement and insight appear normal. Mood & affect appropriate.     Data Reviewed: I have personally reviewed following labs and imaging studies  CBC: Recent Labs  Lab 07/23/20 2219 07/24/20 0537  WBC 9.6 7.7  NEUTROABS 7.8*  --   HGB 14.7 12.7  HCT 47.5* 39.3  MCV 99.8 95.6  PLT 191 725    Basic Metabolic Panel: Recent Labs  Lab 07/23/20 2219 07/24/20 0537 07/25/20 0239  NA 146* 144 143  K 4.3 4.0 3.5  CL 110 113* 109  CO2  20* 20* 24  GLUCOSE 226* 116* 107*  BUN 10 9 9   CREATININE 1.02* 0.77 0.88  CALCIUM 8.7* 7.9* 8.7*    GFR: Estimated Creatinine Clearance: 69.8 mL/min (by C-G formula based on SCr of 0.88 mg/dL).  Liver Function Tests: Recent Labs  Lab 07/23/20 2219 07/24/20 0537 07/25/20 0239  AST 28 23 18   ALT 20 19 17   ALKPHOS 61 47 42  BILITOT 0.4 0.3 0.6  PROT 7.1 6.1* 6.1*  ALBUMIN 3.9 3.4* 3.3*    CBG: Recent Labs  Lab 07/23/20 2241  GLUCAP 170*     Recent Results (from the past 240 hour(s))  Respiratory Panel by RT PCR (Flu A&B, Covid) - Nasopharyngeal Swab     Status: None   Collection Time: 07/23/20 10:19 PM   Specimen: Nasopharyngeal Swab  Result Value Ref Range Status   SARS  Coronavirus 2 by RT PCR NEGATIVE NEGATIVE Final    Comment: (NOTE) SARS-CoV-2 target nucleic acids are NOT DETECTED.  The SARS-CoV-2 RNA is generally detectable in upper respiratoy specimens during the acute phase of infection. The lowest concentration of SARS-CoV-2 viral copies this assay can detect is 131 copies/mL. A negative result does not preclude SARS-Cov-2 infection and should not be used as the sole basis for treatment or other patient management decisions. A negative result may occur with  improper specimen collection/handling, submission of specimen other than nasopharyngeal swab, presence of viral mutation(s) within the areas targeted by this assay, and inadequate number of viral copies (<131 copies/mL). A negative result must be combined with clinical observations, patient history, and epidemiological information. The expected result is Negative.  Fact Sheet for Patients:  PinkCheek.be  Fact Sheet for Healthcare Providers:  GravelBags.it  This test is no t yet approved or cleared by the [place] FDA and  has been authorized for detection and/or diagnosis of SARS-CoV-2 by FDA under an Emergency Use Authorization (EUA). This EUA will remain  in effect (meaning this test can be used) for the duration of the COVID-19 declaration under Section 564(b)(1) of the Act, 21 U.S.C. section 360bbb-3(b)(1), unless the authorization is terminated or revoked sooner.     Influenza A by PCR NEGATIVE NEGATIVE Final   Influenza B by PCR NEGATIVE NEGATIVE Final    Comment: (NOTE) The Xpert Xpress SARS-CoV-2/FLU/RSV assay is intended as an aid in  the diagnosis of influenza from Nasopharyngeal swab specimens and  should not be used as a sole basis for treatment. Nasal washings and  aspirates are unacceptable for Xpert Xpress SARS-CoV-2/FLU/RSV  testing.  Fact Sheet for  Patients: PinkCheek.be  Fact Sheet for Healthcare Providers: GravelBags.it  This test is not yet approved or cleared by the [place] FDA and  has been authorized for detection and/or diagnosis of SARS-CoV-2 by  FDA under an Emergency Use Authorization (EUA). This EUA will remain  in effect (meaning this test can be used) for the duration of the  Covid-19 declaration under Section 564(b)(1) of the Act, 21  U.S.C. section 360bbb-3(b)(1), unless the authorization is  terminated or revoked. Performed at [facility], [place] [address], [place], [place] [address]   MRSA PCR Screening     Status: None   Collection Time: 07/24/20  5:26 AM   Specimen: Nasopharyngeal  Result Value Ref Range Status   MRSA by PCR NEGATIVE NEGATIVE Final    Comment:        The GeneXpert MRSA Assay (FDA approved for NASAL specimens only), is one component of a  comprehensive MRSA colonization surveillance program. It is not intended to diagnose MRSA infection nor to guide or monitor treatment for MRSA infections. Performed at [facility], [place] [address], [place], [place] [address]          Radiology Studies: DG Chest Port 1 View  Result Date: 07/24/2020 CLINICAL DATA:  Drug overdose EXAM: PORTABLE CHEST 1 VIEW COMPARISON:  08/21/2011 FINDINGS: Lungs volumes are small, but are symmetric and are clear. Previously noted tracheostomy and nasoenteric feeding tube have been removed. No pneumothorax or pleural effusion. Cardiac size within normal limits. Pulmonary vascularity is normal. Osseous structures are age-appropriate. No acute bone abnormality. IMPRESSION: No active disease. Electronically Signed   By: Fidela Salisbury MD   On: 07/24/2020 01:22        Scheduled Meds:  Chlorhexidine Gluconate Cloth  6 each Topical Daily   enoxaparin (LOVENOX) injection  40 mg Subcutaneous Q24H   influenza vac  split quadrivalent PF  0.5 mL Intramuscular Tomorrow-1000   mouth rinse  15 mL Mouth Rinse BID   ondansetron (ZOFRAN) IV  4 mg Intravenous Once   Continuous Infusions:   LOS: 1 day   Time spent: 2mins Greater than 50% of this time was spent in counseling, explanation of diagnosis, planning of further management, and coordination of care.  I have personally reviewed and interpreted on  07/25/2020 daily labs, I reviewed all nursing notes, pharmacy notes, icu note, consultant notes,  vitals, pertinent old records  I have discussed plan of care as described above with RN , patient  on 07/25/2020  Voice Recognition /Dragon dictation system was used to create this note, attempts have been made to correct errors. Please contact the author with questions and/or clarifications.   Florencia Reasons, MD PhD FACP [facility]  Available via Epic secure chat 7am-7pm for nonurgent issues Please page for urgent issues To page the attending provider between 7A-7P or the covering provider during after hours 7P-7A, please log into the web site [email or web address] and access using universal [place] password for that web site. If you do not have the password, please call the hospital operator.    07/25/2020, 10:55 AM

## 2020-07-25 NOTE — Consult Note (Signed)
Reason for Consult:''patient states she does not know psych recommended inaptient psych, she does not agree, she wants reconsult psych.'' Patient seen today, and states that she is well aware of the inpatient psychiatric recommendation as written but wants to be discharged home so that she can to church tomorrow. Patient has poor insight into her current issues and still exercising poor judgment. Patient has increased risk factors for suicide completion due to the her age, family history, substance abuse, and suggestion of giving away her belongings. She is unable to contract for safety and still meets criteria for inpatient psychiatric admission.  Recommendations:  -Continue 1:1 sitter for safety -Continue current psychiatric medications -Consider social worker consult to facilitate inpatient psychiatric admission. -Recommend IVC for this patient, if she refuses Voluntary psychiatric admission.  Luisa Louk,MD  Attending psychiatrist

## 2020-07-26 MED ORDER — SUMATRIPTAN SUCCINATE 50 MG PO TABS
100.0000 mg | ORAL_TABLET | Freq: Every day | ORAL | Status: DC | PRN
  Administered 2020-07-26 – 2020-07-27 (×2): 100 mg via ORAL
  Filled 2020-07-26 (×4): qty 2

## 2020-07-26 MED ORDER — SUMATRIPTAN SUCCINATE 50 MG PO TABS
100.0000 mg | ORAL_TABLET | Freq: Once | ORAL | Status: AC
  Administered 2020-07-26: 100 mg via ORAL
  Filled 2020-07-26: qty 2

## 2020-07-26 MED ORDER — SUMATRIPTAN SUCCINATE 25 MG PO TABS
25.0000 mg | ORAL_TABLET | Freq: Once | ORAL | Status: DC
  Filled 2020-07-26: qty 1

## 2020-07-26 NOTE — Progress Notes (Signed)
PROGRESS NOTE    [name]  HGD:[id number] DOB: Aug 20, 1956 DOA: 07/23/2020 PCP: Laurey Morale, MD    Chief Complaint  Patient presents with  . Intentional Drug Overdose    Brief Narrative:  64 year old with history of depression, GERD, PTSD Admitted with intentional overdose of 42 tablets ofPercocet 5-325. EMS report pt sent a suicidal text to someone and they notified PD.  Initially responded to IV Narcan and then placed on Narcan drip.  Poison control called and NAC infusion recommended.  PCCM consulted for admission.  Subjective:  She is alert, awake and interactive She is medically stable , awaiting inpatient psych placement  Assessment & Plan:   Principal Problem:   Intentional opiate overdose ([place]) Active Problems:   Drug overdose  Intentional drug overdose -Admitted to ICU received Narcan drip and NAC infusion -She is now off drip, alert, awake and interactive --Continue suicidal precaution, she does not agree with inpatient psych placement, she want to talk to psychiatry again, psych reconsulted   Migraine headache Request imitrex 100mg  prn, once dose ordered   DVT prophylaxis: enoxaparin (LOVENOX) injection 40 mg Start: 07/24/20 1000 SCDs Start: 07/24/20 0300   Code Status:full Family Communication: declined my offer to update her family Disposition:   Status is: Inpatient   Dispo: The patient is from: home              Anticipated d/c is to: Inpatient psych unit              Anticipated d/c date is: she is medically stable to discharge to inpatient psych unit                Consultants:   Psychiatry  Procedures:   none  Antimicrobials:   none     Objective: Vitals:   07/25/20 1415 07/25/20 2017 07/26/20 0502 07/26/20 0637  BP: (!) 137/91 124/70  112/66  Pulse: 75 91  69  Resp: 12 16    Temp: 99.2 F (37.3 C) 97.9 F (36.6 C)  98.1 F (36.7 C)  TempSrc: Oral Oral  Oral  SpO2: 93% 95%  94%  Weight:   75.2 kg   Height:         Intake/Output Summary (Last 24 hours) at 07/26/2020 1019 Last data filed at 07/25/2020 1303 Gross per 24 hour  Intake 240 ml  Output --  Net 240 ml   Filed Weights   07/23/20 2215 07/24/20 0500 07/26/20 0502  Weight: 95 kg 95 kg 75.2 kg    Examination:  General exam: calm, NAD Respiratory system: Clear to auscultation. Respiratory effort normal. Cardiovascular system: S1 & S2 heard, RRR. No JVD, no murmur, No pedal edema. Gastrointestinal system: Abdomen is nondistended, soft and nontender. Normal bowel sounds heard. Central nervous system: Alert and oriented. No focal neurological deficits. Extremities: Symmetric 5 x 5 power. Skin: No rashes, lesions or ulcers Psychiatry: Judgement and insight appear normal. Mood & affect appropriate.     Data Reviewed: I have personally reviewed following labs and imaging studies  CBC: Recent Labs  Lab 07/23/20 2219 07/24/20 0537  WBC 9.6 7.7  NEUTROABS 7.8*  --   HGB 14.7 12.7  HCT 47.5* 39.3  MCV 99.8 95.6  PLT 191 962    Basic Metabolic Panel: Recent Labs  Lab 07/23/20 2219 07/24/20 0537 07/25/20 0239  NA 146* 144 143  K 4.3 4.0 3.5  CL 110 113* 109  CO2 20* 20* 24  GLUCOSE 226* 116* 107*  BUN 10  9 9  CREATININE 1.02* 0.77 0.88  CALCIUM 8.7* 7.9* 8.7*    GFR: Estimated Creatinine Clearance: 61.5 mL/min (by C-G formula based on SCr of 0.88 mg/dL).  Liver Function Tests: Recent Labs  Lab 07/23/20 2219 07/24/20 0537 07/25/20 0239  AST 28 23 18   ALT 20 19 17   ALKPHOS 61 47 42  BILITOT 0.4 0.3 0.6  PROT 7.1 6.1* 6.1*  ALBUMIN 3.9 3.4* 3.3*    CBG: Recent Labs  Lab 07/23/20 2241  GLUCAP 170*     Recent Results (from the past 240 hour(s))  Respiratory Panel by RT PCR (Flu A&B, Covid) - Nasopharyngeal Swab     Status: None   Collection Time: 07/23/20 10:19 PM   Specimen: Nasopharyngeal Swab  Result Value Ref Range Status   SARS Coronavirus 2 by RT PCR NEGATIVE NEGATIVE Final    Comment:  (NOTE) SARS-CoV-2 target nucleic acids are NOT DETECTED.  The SARS-CoV-2 RNA is generally detectable in upper respiratoy specimens during the acute phase of infection. The lowest concentration of SARS-CoV-2 viral copies this assay can detect is 131 copies/mL. A negative result does not preclude SARS-Cov-2 infection and should not be used as the sole basis for treatment or other patient management decisions. A negative result may occur with  improper specimen collection/handling, submission of specimen other than nasopharyngeal swab, presence of viral mutation(s) within the areas targeted by this assay, and inadequate number of viral copies (<131 copies/mL). A negative result must be combined with clinical observations, patient history, and epidemiological information. The expected result is Negative.  Fact Sheet for Patients:  PinkCheek.be  Fact Sheet for Healthcare Providers:  GravelBags.it  This test is no t yet approved or cleared by the [place] FDA and  has been authorized for detection and/or diagnosis of SARS-CoV-2 by FDA under an Emergency Use Authorization (EUA). This EUA will remain  in effect (meaning this test can be used) for the duration of the COVID-19 declaration under Section 564(b)(1) of the Act, 21 U.S.C. section 360bbb-3(b)(1), unless the authorization is terminated or revoked sooner.     Influenza A by PCR NEGATIVE NEGATIVE Final   Influenza B by PCR NEGATIVE NEGATIVE Final    Comment: (NOTE) The Xpert Xpress SARS-CoV-2/FLU/RSV assay is intended as an aid in  the diagnosis of influenza from Nasopharyngeal swab specimens and  should not be used as a sole basis for treatment. Nasal washings and  aspirates are unacceptable for Xpert Xpress SARS-CoV-2/FLU/RSV  testing.  Fact Sheet for Patients: PinkCheek.be  Fact Sheet for Healthcare  Providers: GravelBags.it  This test is not yet approved or cleared by the [place] FDA and  has been authorized for detection and/or diagnosis of SARS-CoV-2 by  FDA under an Emergency Use Authorization (EUA). This EUA will remain  in effect (meaning this test can be used) for the duration of the  Covid-19 declaration under Section 564(b)(1) of the Act, 21  U.S.C. section 360bbb-3(b)(1), unless the authorization is  terminated or revoked. Performed at [facility], [place] [address], [place], [place] [address]   MRSA PCR Screening     Status: None   Collection Time: 07/24/20  5:26 AM   Specimen: Nasopharyngeal  Result Value Ref Range Status   MRSA by PCR NEGATIVE NEGATIVE Final    Comment:        The GeneXpert MRSA Assay (FDA approved for NASAL specimens only), is one component of a comprehensive MRSA colonization surveillance program. It is not intended to diagnose  MRSA infection nor to guide or monitor treatment for MRSA infections. Performed at [facility], [place] [address] [place], [place] [address]          Radiology Studies: No results found.      Scheduled Meds: . Chlorhexidine Gluconate Cloth  6 each Topical Daily  . enoxaparin (LOVENOX) injection  40 mg Subcutaneous Q24H  . influenza vac split quadrivalent PF  0.5 mL Intramuscular Tomorrow-1000  . mouth rinse  15 mL Mouth Rinse BID  . ondansetron (ZOFRAN) IV  4 mg Intravenous Once  . SUMAtriptan  25 mg Oral Once   Continuous Infusions:   LOS: 2 days   Time spent: 70mins Greater than 50% of this time was spent in counseling, explanation of diagnosis, planning of further management, and coordination of care.  I have personally reviewed and interpreted on  07/26/2020 daily labs, I reviewed all nursing notes, pharmacy notes, icu note, consultant notes,  vitals, pertinent old records  I have discussed plan of care as described above  with RN , patient  on 07/26/2020  Voice Recognition /Dragon dictation system was used to create this note, attempts have been made to correct errors. Please contact the author with questions and/or clarifications.   Florencia Reasons, MD PhD FACP [facility]  Available via Epic secure chat 7am-7pm for nonurgent issues Please page for urgent issues To page the attending provider between 7A-7P or the covering provider during after hours 7P-7A, please log into the web site [email or web address] and access using universal [place] password for that web site. If you do not have the password, please call the hospital operator.    07/26/2020, 10:19 AM

## 2020-07-26 NOTE — Plan of Care (Signed)
  Problem: Clinical Measurements: Goal: Ability to maintain clinical measurements within normal limits will improve Outcome: Progressing   Problem: Clinical Measurements: Goal: Respiratory complications will improve Outcome: Progressing   Problem: Clinical Measurements: Goal: Cardiovascular complication will be avoided Outcome: Progressing  Sitter at bedside, pt. States she did not try to hurt herself and states she should not be here, informed patient of series of events and also reminded her she was  under psychiatric care. Reported history of migraine, per pt request MD notified, order for tylenol obtained. VSS, no distress noted. Will continue to monitor pt. Closely. Alfredo Batty

## 2020-07-26 NOTE — TOC Progression Note (Addendum)
Transition of Care [facility]) - Progression Note    Patient Details  Name: [name] MRN: [id number] Date of Birth: Jun 01, 1956  Transition of Care [facility]) CM/SW Contact  Servando Snare, [place] Phone Number: 07/26/2020, 10:59 AM  Clinical Narrative:    LCSW met with patient. Patient is from home and independent in all ADLs. Patient is agreeable to voluntary inpatient psych. LCSW will fax patient out to inpatient psych facilities.    Faxed to: [facility]- No answer left message [facility]- Declined due to staffing. Call back tomorrow [facility]- Left message [place]  Patient has accepted to [facility]. Can transport tomorrow 11/15 after 11am.  Admit to [place] report #: [phone] Accepting: Terrilyn Saver  Expected Discharge Plan: Psychiatric Hospital Barriers to Discharge: Psych Bed not available  Expected Discharge Plan and Services Expected Discharge Plan: [facility]   Discharge Planning Services: CM Consult Post Acute Care Choice: NA Living arrangements for the past 2 months: Single Family Home                                       Social Determinants of Health (SDOH) Interventions    Readmission Risk Interventions Readmission Risk Prevention Plan 07/26/2020  Transportation Screening Complete  Home Care Screening Complete  Some recent data might be hidden

## 2020-07-27 ENCOUNTER — Encounter (HOSPITAL_COMMUNITY): Payer: Self-pay | Admitting: Pulmonary Disease

## 2020-07-27 DIAGNOSIS — F329 Major depressive disorder, single episode, unspecified: Secondary | ICD-10-CM | POA: Diagnosis not present

## 2020-07-27 DIAGNOSIS — F1020 Alcohol dependence, uncomplicated: Secondary | ICD-10-CM | POA: Diagnosis not present

## 2020-07-27 DIAGNOSIS — F4310 Post-traumatic stress disorder, unspecified: Secondary | ICD-10-CM | POA: Diagnosis not present

## 2020-07-27 DIAGNOSIS — R45851 Suicidal ideations: Secondary | ICD-10-CM | POA: Diagnosis not present

## 2020-07-27 DIAGNOSIS — E119 Type 2 diabetes mellitus without complications: Secondary | ICD-10-CM | POA: Diagnosis not present

## 2020-07-27 DIAGNOSIS — Z62810 Personal history of physical and sexual abuse in childhood: Secondary | ICD-10-CM | POA: Diagnosis not present

## 2020-07-27 DIAGNOSIS — F1290 Cannabis use, unspecified, uncomplicated: Secondary | ICD-10-CM | POA: Diagnosis not present

## 2020-07-27 DIAGNOSIS — K219 Gastro-esophageal reflux disease without esophagitis: Secondary | ICD-10-CM | POA: Diagnosis not present

## 2020-07-27 MED ORDER — DOCUSATE SODIUM 100 MG PO CAPS: 100.0000 mg | ORAL_CAPSULE | Freq: Two times a day (BID) | ORAL | 0 refills | Status: DC | PRN

## 2020-07-27 MED ORDER — SUMATRIPTAN SUCCINATE 100 MG PO TABS: 100.0000 mg | ORAL_TABLET | Freq: Every day | ORAL | 0 refills | Status: DC | PRN

## 2020-07-27 MED ORDER — SUMATRIPTAN SUCCINATE 100 MG PO TABS: 100.0000 mg | ORAL_TABLET | Freq: Four times a day (QID) | ORAL | 0 refills | Status: DC | PRN

## 2020-07-27 MED ORDER — ACETAMINOPHEN 500 MG PO TABS
1000.0000 mg | ORAL_TABLET | Freq: Once | ORAL | Status: AC
  Administered 2020-07-27: 1000 mg via ORAL
  Filled 2020-07-27: qty 2

## 2020-07-27 NOTE — Discharge Summary (Signed)
Physician Discharge Summary  [name] OFB:[id number] DOB: [birth year] DOA: 07/23/2020  PCP: Laurey Morale, MD  Admit date: 07/23/2020 Discharge date: 07/27/2020  Admitted From: Home Disposition:  Behavioral health hospital  Discharge Condition:Stable CODE STATUS:FULL Diet recommendation:  Regular  Brief/Interim Summary:   Patient is a 64 year old with history of depression, GERD, PTSD Admitted with intentional overdose of 42 tablets ofPercocet5-325. EMS report pt sent a suicidal text to someone and they notified PD.Initially responded to IV Narcan and then placed on Narcan drip. Poison control called and NAC infusion was recommended. PCCM consulted for admission.She was stabilized and transferred to [facility] service.  Psychiatry recommended inpatient psychiatric admission.  She is medically stable for discharge today.  Following problems were addressed during hospitalization:  Intentional drug overdose -Admitted to ICU ,received Narcan drip and NAC infusion -She is now off drip, alert, awake and oriented -being transferred to behavioral health hospital today   Migraine headache On  imitrex 100mg  prn headache    Discharge Diagnoses:  Principal Problem:   Intentional opiate overdose [facility]) Active Problems:   Drug overdose    Discharge Instructions  Discharge Instructions    Diet general   Complete by: As directed    Discharge instructions   Complete by: As directed    1)Follow up with psychiatry   Increase activity slowly   Complete by: As directed      Allergies as of 07/27/2020      Reactions   Hydrocodone Itching   Severe itching - can take with benadryl  Severe itching - can take with benadryl    Hydromorphone Shortness Of Breath, Other (See Comments)   Respiratory distress Other reaction(s): Respiratory Distress Respiratory distress Patient became unresponsive  Respiratory distress   Meperidine Itching   Demerol    Meperidine Hcl  Itching   REACTION: hives, itich      Medication List    STOP taking these medications   diphenhydrAMINE 25 mg capsule Commonly known as: BENADRYL   oxyCODONE-acetaminophen 5-325 MG tablet Commonly known as: PERCOCET/ROXICET   traZODone 50 MG tablet Commonly known as: DESYREL     TAKE these medications   docusate sodium 100 MG capsule Commonly known as: COLACE Take 1 capsule (100 mg total) by mouth 2 (two) times daily as needed for mild constipation.   SUMAtriptan 100 MG tablet Commonly known as: IMITREX Take 1 tablet (100 mg total) by mouth every 6 (six) hours as needed for migraine or headache. May repeat in 2 hours if headache persists or recurs.       Allergies  Allergen Reactions  . Hydrocodone Itching    Severe itching - can take with benadryl  Severe itching - can take with benadryl   . Hydromorphone Shortness Of Breath and Other (See Comments)    Respiratory distress Other reaction(s): Respiratory Distress Respiratory distress Patient became unresponsive  Respiratory distress   . Meperidine Itching  . Demerol   . Meperidine Hcl Itching    REACTION: hives, itich    Consultations:  PCCM,behavioral health   Procedures/Studies: DG Chest Port 1 View  Result Date: 07/24/2020 CLINICAL DATA:  Drug overdose EXAM: PORTABLE CHEST 1 VIEW COMPARISON:  08/21/2011 FINDINGS: Lungs volumes are small, but are symmetric and are clear. Previously noted tracheostomy and nasoenteric feeding tube have been removed. No pneumothorax or pleural effusion. Cardiac size within normal limits. Pulmonary vascularity is normal. Osseous structures are age-appropriate. No acute bone abnormality. IMPRESSION: No active disease. Electronically Signed   By: Fidela Salisbury  MD   On: 07/24/2020 01:22      Subjective: Patient seen and examined at the bedside this morning.  Hemodynamically stable for discharge.  Discharge Exam: Vitals:   07/26/20 2123 07/27/20 0604  BP: 104/85 (!)  83/58  Pulse: 75 73  Resp: 16   Temp: 98 F (36.7 C) 98 F (36.7 C)  SpO2: 96% 93%   Vitals:   07/26/20 0637 07/26/20 1423 07/26/20 2123 07/27/20 0604  BP: 112/66 134/77 104/85 (!) 83/58  Pulse: 69 78 75 73  Resp:  18 16   Temp: 98.1 F (36.7 C) 98.5 F (36.9 C) 98 F (36.7 C) 98 F (36.7 C)  TempSrc: Oral Oral Oral Oral  SpO2: 94% 95% 96% 93%  Weight:      Height:        General: Pt is alert, awake, not in acute distress Cardiovascular: RRR, S1/S2 +, no rubs, no gallops Respiratory: CTA bilaterally, no wheezing, no rhonchi Abdominal: Soft, NT, ND, bowel sounds + Extremities: no edema, no cyanosis    The results of significant diagnostics from this hospitalization (including imaging, microbiology, ancillary and laboratory) are listed below for reference.     Microbiology: Recent Results (from the past 240 hour(s))  Respiratory Panel by RT PCR (Flu A&B, Covid) - Nasopharyngeal Swab     Status: None   Collection Time: 07/23/20 10:19 PM   Specimen: Nasopharyngeal Swab  Result Value Ref Range Status   SARS Coronavirus 2 by RT PCR NEGATIVE NEGATIVE Final    Comment: (NOTE) SARS-CoV-2 target nucleic acids are NOT DETECTED.  The SARS-CoV-2 RNA is generally detectable in upper respiratoy specimens during the acute phase of infection. The lowest concentration of SARS-CoV-2 viral copies this assay can detect is 131 copies/mL. A negative result does not preclude SARS-Cov-2 infection and should not be used as the sole basis for treatment or other patient management decisions. A negative result may occur with  improper specimen collection/handling, submission of specimen other than nasopharyngeal swab, presence of viral mutation(s) within the areas targeted by this assay, and inadequate number of viral copies (<131 copies/mL). A negative result must be combined with clinical observations, patient history, and epidemiological information. The expected result is  Negative.  Fact Sheet for Patients:  PinkCheek.be  Fact Sheet for Healthcare Providers:  GravelBags.it  This test is no t yet approved or cleared by the [place] FDA and  has been authorized for detection and/or diagnosis of SARS-CoV-2 by FDA under an Emergency Use Authorization (EUA). This EUA will remain  in effect (meaning this test can be used) for the duration of the COVID-19 declaration under Section 564(b)(1) of the Act, 21 U.S.C. section 360bbb-3(b)(1), unless the authorization is terminated or revoked sooner.     Influenza A by PCR NEGATIVE NEGATIVE Final   Influenza B by PCR NEGATIVE NEGATIVE Final    Comment: (NOTE) The Xpert Xpress SARS-CoV-2/FLU/RSV assay is intended as an aid in  the diagnosis of influenza from Nasopharyngeal swab specimens and  should not be used as a sole basis for treatment. Nasal washings and  aspirates are unacceptable for Xpert Xpress SARS-CoV-2/FLU/RSV  testing.  Fact Sheet for Patients: PinkCheek.be  Fact Sheet for Healthcare Providers: GravelBags.it  This test is not yet approved or cleared by the [place] FDA and  has been authorized for detection and/or diagnosis of SARS-CoV-2 by  FDA under an Emergency Use Authorization (EUA). This EUA will remain  in effect (meaning this test can be used) for the  duration of the  Covid-19 declaration under Section 564(b)(1) of the Act, 21  U.S.C. section 360bbb-3(b)(1), unless the authorization is  terminated or revoked. Performed at [facility], [place] [address], [place], [place] [address]   MRSA PCR Screening     Status: None   Collection Time: 07/24/20  5:26 AM   Specimen: Nasopharyngeal  Result Value Ref Range Status   MRSA by PCR NEGATIVE NEGATIVE Final    Comment:        The GeneXpert MRSA Assay (FDA approved for NASAL specimens only), is one  component of a comprehensive MRSA colonization surveillance program. It is not intended to diagnose MRSA infection nor to guide or monitor treatment for MRSA infections. Performed at [facility], [place] [address] [place],  [address]      Labs: BNP (last 3 results) No results for input(s): BNP in the last 8760 hours. Basic Metabolic Panel: Recent Labs  Lab 07/23/20 2219 07/24/20 0537 07/25/20 0239  NA 146* 144 143  K 4.3 4.0 3.5  CL 110 113* 109  CO2 20* 20* 24  GLUCOSE 226* 116* 107*  BUN 10 9 9   CREATININE 1.02* 0.77 0.88  CALCIUM 8.7* 7.9* 8.7*   Liver Function Tests: Recent Labs  Lab 07/23/20 2219 07/24/20 0537 07/25/20 0239  AST 28 23 18   ALT 20 19 17   ALKPHOS 61 47 42  BILITOT 0.4 0.3 0.6  PROT 7.1 6.1* 6.1*  ALBUMIN 3.9 3.4* 3.3*   No results for input(s): LIPASE, AMYLASE in the last 168 hours. No results for input(s): AMMONIA in the last 168 hours. CBC: Recent Labs  Lab 07/23/20 2219 07/24/20 0537  WBC 9.6 7.7  NEUTROABS 7.8*  --   HGB 14.7 12.7  HCT 47.5* 39.3  MCV 99.8 95.6  PLT 191 183   Cardiac Enzymes: No results for input(s): CKTOTAL, CKMB, CKMBINDEX, TROPONINI in the last 168 hours. BNP: Invalid input(s): POCBNP CBG: Recent Labs  Lab 07/23/20 2241  GLUCAP 170*   D-Dimer No results for input(s): DDIMER in the last 72 hours. Hgb A1c No results for input(s): HGBA1C in the last 72 hours. Lipid Profile No results for input(s): CHOL, HDL, LDLCALC, TRIG, CHOLHDL, LDLDIRECT in the last 72 hours. Thyroid function studies No results for input(s): TSH, T4TOTAL, T3FREE, THYROIDAB in the last 72 hours.  Invalid input(s): FREET3 Anemia work up No results for input(s): VITAMINB12, FOLATE, FERRITIN, TIBC, IRON, RETICCTPCT in the last 72 hours. Urinalysis    Component Value Date/Time   BILIRUBINUR 1+ 11/21/2018 1445   PROTEINUR Positive (A) 11/21/2018 1445   UROBILINOGEN 0.2 11/21/2018 1445   NITRITE N  11/21/2018 1445   LEUKOCYTESUR Negative 11/21/2018 1445   Sepsis Labs Invalid input(s): PROCALCITONIN,  WBC,  LACTICIDVEN Microbiology Recent Results (from the past 240 hour(s))  Respiratory Panel by RT PCR (Flu A&B, Covid) - Nasopharyngeal Swab     Status: None   Collection Time: 07/23/20 10:19 PM   Specimen: Nasopharyngeal Swab  Result Value Ref Range Status   SARS Coronavirus 2 by RT PCR NEGATIVE NEGATIVE Final    Comment: (NOTE) SARS-CoV-2 target nucleic acids are NOT DETECTED.  The SARS-CoV-2 RNA is generally detectable in upper respiratoy specimens during the acute phase of infection. The lowest concentration of SARS-CoV-2 viral copies this assay can detect is 131 copies/mL. A negative result does not preclude SARS-Cov-2 infection and should not be used as the sole basis for treatment or other patient management decisions. A negative result may  occur with  improper specimen collection/handling, submission of specimen other than nasopharyngeal swab, presence of viral mutation(s) within the areas targeted by this assay, and inadequate number of viral copies (<131 copies/mL). A negative result must be combined with clinical observations, patient history, and epidemiological information. The expected result is Negative.  Fact Sheet for Patients:  PinkCheek.be  Fact Sheet for Healthcare Providers:  GravelBags.it  This test is no t yet approved or cleared by the [place] FDA and  has been authorized for detection and/or diagnosis of SARS-CoV-2 by FDA under an Emergency Use Authorization (EUA). This EUA will remain  in effect (meaning this test can be used) for the duration of the COVID-19 declaration under Section 564(b)(1) of the Act, 21 U.S.C. section 360bbb-3(b)(1), unless the authorization is terminated or revoked sooner.     Influenza A by PCR NEGATIVE NEGATIVE Final   Influenza B by PCR NEGATIVE NEGATIVE  Final    Comment: (NOTE) The Xpert Xpress SARS-CoV-2/FLU/RSV assay is intended as an aid in  the diagnosis of influenza from Nasopharyngeal swab specimens and  should not be used as a sole basis for treatment. Nasal washings and  aspirates are unacceptable for Xpert Xpress SARS-CoV-2/FLU/RSV  testing.  Fact Sheet for Patients: PinkCheek.be  Fact Sheet for Healthcare Providers: GravelBags.it  This test is not yet approved or cleared by the [place] FDA and  has been authorized for detection and/or diagnosis of SARS-CoV-2 by  FDA under an Emergency Use Authorization (EUA). This EUA will remain  in effect (meaning this test can be used) for the duration of the  Covid-19 declaration under Section 564(b)(1) of the Act, 21  U.S.C. section 360bbb-3(b)(1), unless the authorization is  terminated or revoked. Performed at [facility], [place] [address], [place], [place] [address]   MRSA PCR Screening     Status: None   Collection Time: 07/24/20  5:26 AM   Specimen: Nasopharyngeal  Result Value Ref Range Status   MRSA by PCR NEGATIVE NEGATIVE Final    Comment:        The GeneXpert MRSA Assay (FDA approved for NASAL specimens only), is one component of a comprehensive MRSA colonization surveillance program. It is not intended to diagnose MRSA infection nor to guide or monitor treatment for MRSA infections. Performed at [facility], [place] [address], [place], [place] [address]     Please note: You were cared for by a hospitalist during your hospital stay. Once you are discharged, your primary care physician will handle any further medical issues. Please note that NO REFILLS for any discharge medications will be authorized once you are discharged, as it is imperative that you return to your primary care physician (or establish a relationship with a primary care physician if you do not have  one) for your post hospital discharge needs so that they can reassess your need for medications and monitor your lab values.    Time coordinating discharge: 40 minutes  SIGNED:   Shelly Coss, MD  [facility] 07/27/2020, 8:29 AM Pager [phone]  If 7PM-7AM, please contact night-coverage [email or web address] Password [email or web address]

## 2020-07-29 ENCOUNTER — Encounter: Payer: Federal, State, Local not specified - PPO | Admitting: Family Medicine

## 2020-07-29 DIAGNOSIS — M25519 Pain in unspecified shoulder: Secondary | ICD-10-CM | POA: Diagnosis not present

## 2020-07-29 DIAGNOSIS — M25511 Pain in right shoulder: Secondary | ICD-10-CM | POA: Diagnosis not present

## 2020-08-03 DIAGNOSIS — F339 Major depressive disorder, recurrent, unspecified: Secondary | ICD-10-CM | POA: Diagnosis not present

## 2020-08-03 DIAGNOSIS — F1010 Alcohol abuse, uncomplicated: Secondary | ICD-10-CM | POA: Diagnosis not present

## 2020-08-03 DIAGNOSIS — F1210 Cannabis abuse, uncomplicated: Secondary | ICD-10-CM | POA: Diagnosis not present

## 2020-08-12 ENCOUNTER — Telehealth (INDEPENDENT_AMBULATORY_CARE_PROVIDER_SITE_OTHER): Payer: Federal, State, Local not specified - PPO | Admitting: Family Medicine

## 2020-08-12 ENCOUNTER — Encounter: Payer: Self-pay | Admitting: Family Medicine

## 2020-08-12 ENCOUNTER — Telehealth: Payer: Self-pay | Admitting: Family Medicine

## 2020-08-12 VITALS — Ht 61.0 in | Wt 165.0 lb

## 2020-08-12 DIAGNOSIS — U071 COVID-19: Secondary | ICD-10-CM

## 2020-08-12 NOTE — Progress Notes (Signed)
   Subjective:    Patient ID: [name], female    DOB: Dec 26, 1955, 64 y.o.   MRN: [id number]  HPI Virtual Visit via Telephone Note  I connected with the patient on 08/12/20 at  3:45 PM EST by telephone and verified that I am speaking with the correct person using two identifiers.   I discussed the limitations, risks, security and privacy concerns of performing an evaluation and management service by telephone and the availability of in person appointments. I also discussed with the patient that there may be a patient responsible charge related to this service. The patient expressed understanding and agreed to proceed.  Location patient: home Location provider: work or home office Participants present for the call: patient, provider Patient did not have a visit in the prior 7 days to address this/these issue(s).   History of Present Illness: Here for a Covid-19 infection. For the past 3 days she has had a bad headache, chills, body aches, a dry cough and loss of taste and smell. No NVD. Two days ago she tested positive for the Covid virus. Her boyfriend has also tested positive. She is drinking fluids and taking Nyquil and Dayquil.    Observations/Objective: Patient sounds cheerful and well on the phone. I do not appreciate any SOB. Speech and thought processing are grossly intact. Patient reported vitals:  Assessment and Plan: Covid-19 infection . I advised her to get the monoclonal antibody infusion, and I gave her the contact information to schedule this. She agreed. Recheck as needed.  Alysia Penna, MD   Follow Up Instructions:     [phone] 5-10 [phone] 11-20 9443 21-30 I did not refer this patient for an OV in the next 24 hours for this/these issue(s).  I discussed the assessment and treatment plan with the patient. The patient was provided an opportunity to ask questions and all were answered. The patient agreed with the plan and demonstrated an understanding of the  instructions.   The patient was advised to call back or seek an in-person evaluation if the symptoms worsen or if the condition fails to improve as anticipated.  I provided 14 minutes of non-face-to-face time during this encounter.   Alysia Penna, MD    Review of Systems     Objective:   Physical Exam        Assessment & Plan:

## 2020-08-12 NOTE — Telephone Encounter (Signed)
Patient is calling and stated that she just tested positive for covid and is experiencing a headache, chills, congestion, fever and body aches. Pt wanted to see if provider could call her something in to help with symptoms. [facility] uses [id number] [address], [place] [place] [address]  Phone:  [phone] Fax:  [phone]  CB is [phone]

## 2020-08-12 NOTE — Telephone Encounter (Signed)
appt scheduled for 08/12/20 @ 3:45 for VV.  Patient agreeable.  Dm/cma

## 2020-08-13 ENCOUNTER — Telehealth: Payer: Self-pay | Admitting: Family Medicine

## 2020-08-13 ENCOUNTER — Telehealth (HOSPITAL_COMMUNITY): Payer: Self-pay

## 2020-08-13 ENCOUNTER — Other Ambulatory Visit: Payer: Self-pay | Admitting: Nurse Practitioner

## 2020-08-13 DIAGNOSIS — J9690 Respiratory failure, unspecified, unspecified whether with hypoxia or hypercapnia: Secondary | ICD-10-CM

## 2020-08-13 DIAGNOSIS — R0902 Hypoxemia: Secondary | ICD-10-CM

## 2020-08-13 DIAGNOSIS — U071 COVID-19: Secondary | ICD-10-CM

## 2020-08-13 NOTE — Progress Notes (Signed)
I connected by phone with [name] on 08/13/2020 at 3:03 PM to discuss the potential use of a new treatment for mild to moderate COVID-19 viral infection in non-hospitalized patients.  This patient is a 64 y.o. female that meets the FDA criteria for Emergency Use Authorization of COVID monoclonal antibody casirivimab/imdevimab, bamlanivimab/eteseviamb, or sotrovimab.  Has a (+) direct SARS-CoV-2 viral test result  Has mild or moderate COVID-19   Is NOT hospitalized due to COVID-19  Is within 10 days of symptom onset  Has at least one of the high risk factor(s) for progression to severe COVID-19 and/or hospitalization as defined in EUA.  Specific high risk criteria : BMI > 25 and Chronic Lung Disease   I have spoken and communicated the following to the patient or parent/caregiver regarding COVID monoclonal antibody treatment:  1. FDA has authorized the emergency use for the treatment of mild to moderate COVID-19 in adults and pediatric patients with positive results of direct SARS-CoV-2 viral testing who are 27 years of age and older weighing at least 40 kg, and who are at high risk for progressing to severe COVID-19 and/or hospitalization.  2. The significant known and potential risks and benefits of COVID monoclonal antibody, and the extent to which such potential risks and benefits are unknown.  3. Information on available alternative treatments and the risks and benefits of those alternatives, including clinical trials.  4. Patients treated with COVID monoclonal antibody should continue to self-isolate and use infection control measures (e.g., wear mask, isolate, social distance, avoid sharing personal items, clean and disinfect "high touch" surfaces, and frequent handwashing) according to CDC guidelines.   5. The patient or parent/caregiver has the option to accept or refuse COVID monoclonal antibody treatment.  After reviewing this information with the patient, the  patient has agreed to receive one of the available covid 19 monoclonal antibodies and will be provided an appropriate fact sheet prior to infusion. Jobe Gibbon, NP 08/13/2020 3:03 PM

## 2020-08-13 NOTE — Telephone Encounter (Signed)
Pt call and stated she want dr.Fry to call her in Remdesivir at  [id number] [place], [place]. [address] [place] Phone:  [phone]  Fax:  [phone]    Pt also want dr. Sarajane Jews to know that the she call the # he gave her for [facility]  but get a recording.

## 2020-08-13 NOTE — Telephone Encounter (Signed)
Called to Discuss with patient about Covid symptoms and the use of the monoclonal antibody infusion for those with mild to moderate Covid symptoms and at a high risk of hospitalization.     Pt appears to qualify for this infusion due to co-morbid conditions and/or a member of an at-risk group in accordance with the FDA Emergency Use Authorization.    Unable to reach pt.   Jartavious Mckimmy Lorita Officer, RN

## 2020-08-13 NOTE — Telephone Encounter (Signed)
Called patient to pre-screen for monoclonal antibody infusion after receiving recent positive test. Patient qualifies based off off co-morbid condition and/or member of an at risk group. CPT and REV codes provided for insurance purposes.   Patient Active Problem List   Diagnosis Date Noted  . COVID-19 virus infection 08/12/2020  . Drug overdose 07/24/2020  . Intentional opiate overdose ([place]) 07/24/2020  . Acute lateral meniscus tear of right knee 04/02/2019  . Migraines 07/07/2015  . ARF (acute respiratory failure) ([place]) 08/31/2011  . Respiratory failure ([place]) 08/30/2011  . Aspiration pneumonia ([place]) 08/30/2011  . Hypoxia 08/30/2011  . 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 Physical deconditioning 08/30/2011  . Encephalopathy acute 08/30/2011  . Stridor 08/18/2011  . Radial head fracture, closed 08/06/2011    Class: Acute  . ACUTE SINUSITIS, UNSPECIFIED 06/23/2009  . ANXIETY 04/10/2009  . DEPRESSION 04/10/2009  . SHOULDER PAIN, CHRONIC 04/10/2009  . HIP PAIN, CHRONIC 04/10/2009  . NECK PAIN, CHRONIC 04/10/2009    Patient is interested in learning more about the infusion. RN forwarded information to APP's for additional screening/scheduling.   Etoile Looman Lorita Officer, RN

## 2020-08-14 ENCOUNTER — Ambulatory Visit (HOSPITAL_COMMUNITY)
Admission: RE | Admit: 2020-08-14 | Discharge: 2020-08-14 | Disposition: A | Discharge status: Home / Self Care | Payer: Federal, State, Local not specified - PPO | Source: Ambulatory Visit | Attending: Pulmonary Disease | Admitting: Pulmonary Disease

## 2020-08-14 ENCOUNTER — Other Ambulatory Visit (HOSPITAL_COMMUNITY): Payer: Self-pay

## 2020-08-14 DIAGNOSIS — U071 COVID-19: Secondary | ICD-10-CM

## 2020-08-14 DIAGNOSIS — J9690 Respiratory failure, unspecified, unspecified whether with hypoxia or hypercapnia: Secondary | ICD-10-CM | POA: Insufficient documentation

## 2020-08-14 DIAGNOSIS — R0902 Hypoxemia: Secondary | ICD-10-CM

## 2020-08-14 MED ORDER — SODIUM CHLORIDE 0.9 % IV SOLN: INTRAVENOUS | Status: DC | PRN

## 2020-08-14 MED ORDER — DIPHENHYDRAMINE HCL 50 MG/ML IJ SOLN: 50.0000 mg | Freq: Once | INTRAMUSCULAR | Status: DC | PRN

## 2020-08-14 MED ORDER — EPINEPHRINE 0.3 MG/0.3ML IJ SOAJ: 0.3000 mg | Freq: Once | INTRAMUSCULAR | Status: DC | PRN

## 2020-08-14 MED ORDER — FAMOTIDINE IN NACL 20-0.9 MG/50ML-% IV SOLN: 20.0000 mg | Freq: Once | INTRAVENOUS | Status: DC | PRN

## 2020-08-14 MED ORDER — METHYLPREDNISOLONE SODIUM SUCC 125 MG IJ SOLR: 125.0000 mg | Freq: Once | INTRAMUSCULAR | Status: DC | PRN

## 2020-08-14 MED ORDER — SOTROVIMAB 500 MG/8ML IV SOLN
500.0000 mg | Freq: Once | INTRAVENOUS | Status: AC
  Administered 2020-08-14: 500 mg via INTRAVENOUS
  Filled 2020-08-14: qty 8

## 2020-08-14 MED ORDER — ALBUTEROL SULFATE HFA 108 (90 BASE) MCG/ACT IN AERS: 2.0000 | INHALATION_SPRAY | Freq: Once | RESPIRATORY_TRACT | Status: DC | PRN

## 2020-08-14 NOTE — Discharge Instructions (Signed)
10 Things You Can Do to Manage Your COVID-19 Symptoms at Home If you have possible or confirmed COVID-19: 1. Stay home from work and school. And stay away from other public places. If you must go out, avoid using any kind of public transportation, ridesharing, or taxis. 2. Monitor your symptoms carefully. If your symptoms get worse, call your healthcare provider immediately. 3. Get rest and stay hydrated. 4. If you have a medical appointment, call the healthcare provider ahead of time and tell them that you have or may have COVID-19. 5. For medical emergencies, call 911 and notify the dispatch personnel that you have or may have COVID-19. 6. Cover your cough and sneezes with a tissue or use the inside of your elbow. 7. Wash your hands often with soap and water for at least 20 seconds or clean your hands with an alcohol-based hand sanitizer that contains at least 60% alcohol. 8. As much as possible, stay in a specific room and away from other people in your home. Also, you should use a separate bathroom, if available. If you need to be around other people in or outside of the home, wear a mask. 9. Avoid sharing personal items with other people in your household, like dishes, towels, and bedding. 10. Clean all surfaces that are touched often, like counters, tabletops, and doorknobs. Use household cleaning sprays or wipes according to the label instructions. cdc.gov/coronavirus 03/13/2019 This information is not intended to replace advice given to you by your health care provider. Make sure you discuss any questions you have with your health care provider. Document Revised: 08/15/2019 Document Reviewed: 08/15/2019 Elsevier Patient Education  2020 Elsevier Inc. What types of side effects do monoclonal antibody drugs cause?  Common side effects  In general, the more common side effects caused by monoclonal antibody drugs include: . Allergic reactions, such as hives or itching . Flu-like signs and  symptoms, including chills, fatigue, fever, and muscle aches and pains . Nausea, vomiting . Diarrhea . Skin rashes . Low blood pressure   The CDC is recommending patients who receive monoclonal antibody treatments wait at least 90 days before being vaccinated.  Currently, there are no data on the safety and efficacy of mRNA COVID-19 vaccines in persons who received monoclonal antibodies or convalescent plasma as part of COVID-19 treatment. Based on the estimated half-life of such therapies as well as evidence suggesting that reinfection is uncommon in the 90 days after initial infection, vaccination should be deferred for at least 90 days, as a precautionary measure until additional information becomes available, to avoid interference of the antibody treatment with vaccine-induced immune responses. If you have any questions or concerns after the infusion please call the Advanced Practice Provider on call at [phone]. This number is ONLY intended for your use regarding questions or concerns about the infusion post-treatment side-effects.  Please do not provide this number to others for use. For return to work notes please contact your primary care provider.   If someone you know is interested in receiving treatment please have them call the COVID hotline at [phone].   

## 2020-08-17 NOTE — Telephone Encounter (Signed)
Pt had infusion on 12/3.

## 2020-08-28 DIAGNOSIS — S83281D Other tear of lateral meniscus, current injury, right knee, subsequent encounter: Secondary | ICD-10-CM | POA: Diagnosis not present

## 2020-08-28 DIAGNOSIS — M7051 Other bursitis of knee, right knee: Secondary | ICD-10-CM | POA: Diagnosis not present

## 2020-08-28 DIAGNOSIS — M1711 Unilateral primary osteoarthritis, right knee: Secondary | ICD-10-CM | POA: Diagnosis not present

## 2020-09-02 DIAGNOSIS — S20212A Contusion of left front wall of thorax, initial encounter: Secondary | ICD-10-CM | POA: Diagnosis not present

## 2020-09-02 DIAGNOSIS — Z23 Encounter for immunization: Secondary | ICD-10-CM | POA: Diagnosis not present

## 2020-09-02 DIAGNOSIS — S299XXA Unspecified injury of thorax, initial encounter: Secondary | ICD-10-CM | POA: Diagnosis not present

## 2020-10-14 ENCOUNTER — Encounter (HOSPITAL_BASED_OUTPATIENT_CLINIC_OR_DEPARTMENT_OTHER): Payer: Self-pay | Admitting: Emergency Medicine

## 2020-10-14 ENCOUNTER — Other Ambulatory Visit: Payer: Self-pay

## 2020-10-14 ENCOUNTER — Emergency Department (HOSPITAL_BASED_OUTPATIENT_CLINIC_OR_DEPARTMENT_OTHER)
Admission: EM | Admit: 2020-10-14 | Discharge: 2020-10-14 | Disposition: A | Discharge status: Home / Self Care | Payer: Federal, State, Local not specified - PPO | Attending: Emergency Medicine | Admitting: Emergency Medicine

## 2020-10-14 DIAGNOSIS — W009XXA Unspecified fall due to ice and snow, initial encounter: Secondary | ICD-10-CM | POA: Insufficient documentation

## 2020-10-14 DIAGNOSIS — Z8541 Personal history of malignant neoplasm of cervix uteri: Secondary | ICD-10-CM | POA: Insufficient documentation

## 2020-10-14 DIAGNOSIS — S40021A Contusion of right upper arm, initial encounter: Secondary | ICD-10-CM

## 2020-10-14 DIAGNOSIS — F17290 Nicotine dependence, other tobacco product, uncomplicated: Secondary | ICD-10-CM | POA: Diagnosis not present

## 2020-10-14 DIAGNOSIS — S01311A Laceration without foreign body of right ear, initial encounter: Secondary | ICD-10-CM

## 2020-10-14 MED ORDER — LIDOCAINE HCL 2 % IJ SOLN
10.0000 mL | Freq: Once | INTRAMUSCULAR | Status: AC
  Administered 2020-10-14: 200 mg via INTRADERMAL
  Filled 2020-10-14: qty 20

## 2020-10-14 NOTE — ED Provider Notes (Signed)
[facility] Provider Note: Georgena Spurling, MD, FACEP  CSN: [id number] MRN: [id number] ARRIVAL: 10/14/20 at 0100 ROOM: [place]   HISTORY OF PRESENT ILLNESS  10/14/20 1:47 AM [name] is a 65 y.o. female who slipped on the ice and fell yesterday evening about 7 PM.  She struck her right ear on the corner of a table.  She has a cut inside her right external ear.  It has been bleeding moderately.  She also has a headache.  She did not lose consciousness.  She has not been vomiting.  She rates the headache is a 9 out of 10, worse with movement of her head.  She is not on anticoagulants.  Tetanus up-to-date.   Past Medical History:  Diagnosis Date  . Anxiety   . Arthritis   . Aspiration pneumonia ([place])   . Bronchitis   . 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 Chronic neck pain    sees Dr. Debarah Crape and Dondra Prader NP at [facility] in [place]   . Depression   . Difficult intubation   . GERD (gastroesophageal reflux disease)   . Headache(784.0)   . Hiatal hernia   . Intentional opiate overdose ([place]) 07/24/2020  . Ovarian cyst   . Pneumonia    hx x4  . PONV (postoperative nausea and vomiting) 08/06/11  . Post traumatic stress disorder (PTSD)   . Pre-diabetes   . Respiratory failure [facility])     Past Surgical History:  Procedure Laterality Date  . ABDOMINAL HYSTERECTOMY  88  . CESAREAN SECTION  87  . COLONOSCOPY  Dec. 2014    per Dr. Cristina Gong, 2 benign polyps, repeat in 10 yrs   . DIRECT LARYNGOSCOPY  08/19/2011   Procedure: DIRECT LARYNGOSCOPY;  Surgeon: Onnie Graham;  Location: [facility];  Service: ENT;  Laterality: N/A;  . KNEE ARTHROSCOPY WITH SUBCHONDROPLASTY Right 04/02/2019   Procedure: Right knee arthroscopy with chondroplasty and medial tibial plateau subchondroplasty;  Surgeon: Nicholes Stairs, MD;  Location: [place];  Service: Orthopedics;  Laterality: Right;  . KNEE SURGERY  14   rt  arthroscopy  . ORIF ELBOW FRACTURE  08/06/2011   Procedure: OPEN REDUCTION INTERNAL FIXATION (ORIF) ELBOW/OLECRANON FRACTURE;  Surgeon: Linna Hoff;  Location: [place];  Service: Orthopedics;  Laterality: Left;  . OVARIAN CYST REMOVAL  80  . TRACHEOSTOMY TUBE PLACEMENT  08/19/2011   Procedure: TRACHEOSTOMY;  Surgeon: Onnie Graham;  Location: [facility];  Service: ENT;  Laterality: N/A;  tracheoscopy    Family History  Problem Relation Age of Onset  . Breast cancer Mother   . Breast cancer Maternal Aunt     Social History   Tobacco Use  . Smoking status: Never Smoker  . Smokeless tobacco: Current User    Types: Snuff  Vaping Use  . Vaping Use: Never used  Substance Use Topics  . Alcohol use: Yes    Alcohol/week: 0.0 standard drinks    Comment: occ  . Drug use: No    Prior to Admission medications   Medication Sig Start Date End Date Taking? Authorizing Provider  mirtazapine (REMERON) 15 MG tablet Take 15 mg by mouth at bedtime. 07/30/20   [provider]  oxyCODONE-acetaminophen (PERCOCET/ROXICET) 5-325 MG tablet Take 1 tablet by mouth 4 (four) times daily as needed. 07/30/20   [provider]  SUMAtriptan (IMITREX) 100 MG tablet Take 1 tablet (100 mg total) by  mouth every 6 (six) hours as needed for migraine or headache. May repeat in 2 hours if headache persists or recurs. 07/27/20   Shelly Coss, MD    Allergies Hydrocodone, Hydromorphone, Meperidine, Demerol, and Meperidine hcl   REVIEW OF SYSTEMS  Negative except as noted here or in the History of Present Illness.   PHYSICAL EXAMINATION  Initial Vital Signs Blood pressure 123/75, pulse 94, temperature 98.2 F (36.8 C), temperature source Oral, resp. rate 18, height 5\' 1"  (1.549 m), weight 72.6 kg, SpO2 98 %.  Examination General: Well-developed, well-nourished female in no acute distress; appearance consistent with age of record HENT: normocephalic; TMs normal; laceration of cymba of right  external ear:    Eyes: pupils equal, round and reactive to light; extraocular muscles intact Neck: supple; no C-spine tenderness Heart: regular rate and rhythm Lungs: clear to auscultation bilaterally Abdomen: soft; nondistended; nontender; bowel sounds present Extremities: No deformity; full range of motion; pulses normal; superficial abrasion right deltoid region Neurologic: Awake, alert and oriented; motor function intact in all extremities and symmetric; no facial droop Skin: Warm and dry Psychiatric: Normal mood and affect   RESULTS  Summary of this visit's results, reviewed and interpreted by myself:   EKG Interpretation  Date/Time:    Ventricular Rate:    PR Interval:    QRS Duration:   QT Interval:    QTC Calculation:   R Axis:     Text Interpretation:        Laboratory Studies: No results found for this or any previous visit (from the past 24 hour(s)). Imaging Studies: No results found.  ED COURSE and MDM  Nursing notes, initial and subsequent vitals signs, including pulse oximetry, reviewed and interpreted by myself.  Vitals:   10/14/20 0111 10/14/20 0116  BP:  123/75  Pulse:  94  Resp:  18  Temp:  98.2 F (36.8 C)  TempSrc:  Oral  SpO2:  98%  Weight: 72.6 kg   Height: 5\' 1"  (1.549 m)    Medications  lidocaine (XYLOCAINE) 2 % (with pres) injection 200 mg (has no administration in time range)    Wound repaired with 5-0 Vicryl which should dissolve on their own.  I do not believe antibiotics are indicated as this is a clean wound and a well vascularized area  PROCEDURES  Procedures LACERATION REPAIR Performed by: Karen Chafe Ardian Haberland Authorized by: Karen Chafe Johney Perotti Consent: Verbal consent obtained. Risks and benefits: risks, benefits and alternatives were discussed Consent given by: patient Patient identity confirmed: provided demographic data Prepped and Draped in normal sterile fashion Wound explored  Laceration Location: Cymba of right external  ear  Laceration Length: 1.5 cm  No Foreign Bodies seen or palpated  Anesthesia: local infiltration  Local anesthetic: lidocaine 2% without epinephrine  Anesthetic total: 2 ml  Irrigation method: syringe Amount of cleaning: standard  Skin closure: 5-0 Vicryl repeat  Number of sutures: 4  Technique: Simple interrupted  Patient tolerance: Patient tolerated the procedure well with no immediate complications.    ED DIAGNOSES     ICD-10-CM   1. Fall due to slipping on ice or snow, initial encounter  W00.9XXA   2. Laceration of right external ear, initial encounter  S01.311A   3. Contusion of right upper arm, initial encounter  S40.021A        Mckennon Zwart, Jenny Reichmann, MD 10/14/20 [id number]

## 2020-10-14 NOTE — ED Triage Notes (Addendum)
Pt reports slipping on ice and falling, hitting her head on a table; pt has avulsion/injury to right ear and reports right sided; pt denies LOC or N/V; pt is not on blood thinner; pt ambulatory and bleeding is controlled

## 2020-11-06 ENCOUNTER — Telehealth: Payer: Self-pay | Admitting: Family Medicine

## 2020-11-06 NOTE — Telephone Encounter (Signed)
Patient is calling and wanted to see if provided could write her a prescription for SUMAtriptan (IMITREX) 100 MG tablet to be sent to Pharmacy  [id number] - [place], [place]. AT [address]  Phone:  [phone] Fax:  [phone]    CB is [phone]

## 2020-11-09 MED ORDER — SUMATRIPTAN SUCCINATE 100 MG PO TABS: 100.0000 mg | ORAL_TABLET | Freq: Four times a day (QID) | ORAL | 11 refills | Status: DC | PRN

## 2020-11-09 NOTE — Telephone Encounter (Signed)
Done

## 2020-11-09 NOTE — Telephone Encounter (Signed)
Dr. Sarajane Jews is not the original prescribing provider.    Can this patient receive a refill?  Last refill-- 07/27/2020----10 tabs no refills Last video visit--08/12/2020

## 2020-11-09 NOTE — Telephone Encounter (Signed)
Pt.notified

## 2020-12-23 ENCOUNTER — Other Ambulatory Visit: Payer: Self-pay

## 2020-12-23 ENCOUNTER — Encounter: Payer: Self-pay | Admitting: Family Medicine

## 2020-12-23 ENCOUNTER — Ambulatory Visit (INDEPENDENT_AMBULATORY_CARE_PROVIDER_SITE_OTHER): Payer: Federal, State, Local not specified - PPO | Admitting: Family Medicine

## 2020-12-23 VITALS — BP 98/78 | HR 74 | Temp 98.1°F | Ht 61.0 in | Wt 156.6 lb

## 2020-12-23 DIAGNOSIS — Z0000 Encounter for general adult medical examination without abnormal findings: Secondary | ICD-10-CM

## 2020-12-23 LAB — BASIC METABOLIC PANEL
BUN: 12 mg/dL (ref 6–23)
CO2: 28 mEq/L (ref 19–32)
Calcium: 9.1 mg/dL (ref 8.4–10.5)
Chloride: 102 mEq/L (ref 96–112)
Creatinine, Ser: 0.83 mg/dL (ref 0.40–1.20)
GFR: 75.46 mL/min (ref >60.00)
Glucose, Bld: 92 mg/dL (ref 70–99)
Potassium: 4.3 mEq/L (ref 3.5–5.1)
Sodium: 138 mEq/L (ref 135–145)

## 2020-12-23 LAB — CBC WITH DIFFERENTIAL/PLATELET
Basophils Absolute: 0 10*3/uL (ref 0.0–0.1)
Basophils Relative: 0.5 % (ref 0.0–3.0)
Eosinophils Absolute: 0.1 10*3/uL (ref 0.0–0.7)
Eosinophils Relative: 2.1 % (ref 0.0–5.0)
HCT: 44.9 % (ref 36.0–46.0)
Hemoglobin: 14.9 g/dL (ref 12.0–15.0)
Lymphocytes Relative: 36.5 % (ref 12.0–46.0)
Lymphs Abs: 2.3 10*3/uL (ref 0.7–4.0)
MCHC: 33.3 g/dL (ref 30.0–36.0)
MCV: 92.6 fl (ref 78.0–100.0)
Monocytes Absolute: 0.5 10*3/uL (ref 0.1–1.0)
Monocytes Relative: 8 % (ref 3.0–12.0)
Neutro Abs: 3.3 10*3/uL (ref 1.4–7.7)
Neutrophils Relative %: 52.9 % (ref 43.0–77.0)
Platelets: 239 10*3/uL (ref 150.0–400.0)
RBC: 4.85 Mil/uL (ref 3.87–5.11)
RDW: 13 % (ref 11.5–15.5)
WBC: 6.3 10*3/uL (ref 4.0–10.5)

## 2020-12-23 LAB — LIPID PANEL
Cholesterol: 142 mg/dL (ref 0–200)
HDL: 55.5 mg/dL (ref >39.00)
LDL Cholesterol: 62 mg/dL (ref 0–99)
NonHDL: 86.92
Total CHOL/HDL Ratio: 3
Triglycerides: 125 mg/dL (ref 0.0–149.0)
VLDL: 25 mg/dL (ref 0.0–40.0)

## 2020-12-23 LAB — HEPATIC FUNCTION PANEL
ALT: 19 U/L (ref 0–35)
AST: 20 U/L (ref 0–37)
Albumin: 3.9 g/dL (ref 3.5–5.2)
Alkaline Phosphatase: 76 U/L (ref 39–117)
Bilirubin, Direct: 0.2 mg/dL (ref 0.0–0.3)
Total Bilirubin: 0.9 mg/dL (ref 0.2–1.2)
Total Protein: 6.7 g/dL (ref 6.0–8.3)

## 2020-12-23 LAB — TSH: TSH: 1.09 u[IU]/mL (ref 0.35–4.50)

## 2020-12-23 LAB — HEMOGLOBIN A1C: Hgb A1c MFr Bld: 6.1 % (ref 4.6–6.5)

## 2020-12-23 MED ORDER — TRAZODONE HCL 50 MG PO TABS: 25.0000 mg | ORAL_TABLET | Freq: Every evening | ORAL | 3 refills | Status: DC | PRN

## 2020-12-23 NOTE — Progress Notes (Signed)
   Subjective:    Patient ID: [name], female    DOB: 1955/12/09, 65 y.o.   MRN: [id number]  HPI Here for a well exam. She feels fine. She still sees the [facility] in [place] for chronic pain, and they have also been writing for sleep medications. Her migraines have been stable. She has been watching her diet, and she has lost over 40 lbs in the past 18 months.    Review of Systems  Constitutional: Negative.   HENT: Negative.   Eyes: Negative.   Respiratory: Negative.   Cardiovascular: Negative.   Gastrointestinal: Negative.   Genitourinary: Negative for decreased urine volume, difficulty urinating, dyspareunia, dysuria, enuresis, flank pain, frequency, hematuria, pelvic pain and urgency.  Musculoskeletal: Negative.   Skin: Negative.   Neurological: Negative.   Psychiatric/Behavioral: Negative.        Objective:   Physical Exam Constitutional:      General: She is not in acute distress.    Appearance: She is well-developed.  HENT:     Head: Normocephalic and atraumatic.     Right Ear: External ear normal.     Left Ear: External ear normal.     Nose: Nose normal.     Mouth/Throat:     Pharynx: No oropharyngeal exudate.  Eyes:     General: No scleral icterus.    Conjunctiva/sclera: Conjunctivae normal.     Pupils: Pupils are equal, round, and reactive to light.  Neck:     Thyroid: No thyromegaly.     Vascular: No JVD.  Cardiovascular:     Rate and Rhythm: Normal rate and regular rhythm.     Heart sounds: Normal heart sounds. No murmur heard. No friction rub. No gallop.   Pulmonary:     Effort: Pulmonary effort is normal. No respiratory distress.     Breath sounds: Normal breath sounds. No wheezing or rales.  Chest:     Chest wall: No tenderness.  Abdominal:     General: Bowel sounds are normal. There is no distension.     Palpations: Abdomen is soft. There is no mass.     Tenderness: There is no abdominal tenderness. There is no  guarding or rebound.  Musculoskeletal:        General: No tenderness. Normal range of motion.     Cervical back: Normal range of motion and neck supple.  Lymphadenopathy:     Cervical: No cervical adenopathy.  Skin:    General: Skin is warm and dry.     Findings: No erythema or rash.  Neurological:     Mental Status: She is alert and oriented to person, place, and time.     Cranial Nerves: No cranial nerve deficit.     Motor: No abnormal muscle tone.     Coordination: Coordination normal.     Deep Tendon Reflexes: Reflexes are normal and symmetric. Reflexes normal.  Psychiatric:        Behavior: Behavior normal.        Thought Content: Thought content normal.        Judgment: Judgment normal.           Assessment & Plan:  Well exam. We discussed diet and exercise. Get fasting labs.  Alysia Penna, MD

## 2021-01-14 DIAGNOSIS — H43811 Vitreous degeneration, right eye: Secondary | ICD-10-CM | POA: Diagnosis not present

## 2021-04-07 DIAGNOSIS — Z1231 Encounter for screening mammogram for malignant neoplasm of breast: Secondary | ICD-10-CM | POA: Diagnosis not present

## 2021-04-07 LAB — HM MAMMOGRAPHY

## 2021-04-16 DIAGNOSIS — M25522 Pain in left elbow: Secondary | ICD-10-CM | POA: Diagnosis not present

## 2021-04-19 ENCOUNTER — Other Ambulatory Visit: Payer: Self-pay | Admitting: Medical

## 2021-04-19 ENCOUNTER — Ambulatory Visit
Admission: RE | Admit: 2021-04-19 | Discharge: 2021-04-19 | Disposition: A | Discharge status: Home / Self Care | Payer: Federal, State, Local not specified - PPO | Source: Ambulatory Visit | Attending: Medical | Admitting: Medical

## 2021-04-19 ENCOUNTER — Other Ambulatory Visit: Payer: Self-pay

## 2021-04-19 DIAGNOSIS — M25522 Pain in left elbow: Secondary | ICD-10-CM

## 2021-04-19 DIAGNOSIS — S52125A Nondisplaced fracture of head of left radius, initial encounter for closed fracture: Secondary | ICD-10-CM | POA: Diagnosis not present

## 2021-04-19 DIAGNOSIS — R937 Abnormal findings on diagnostic imaging of other parts of musculoskeletal system: Secondary | ICD-10-CM | POA: Diagnosis not present

## 2021-04-19 DIAGNOSIS — M25022 Hemarthrosis, left elbow: Secondary | ICD-10-CM | POA: Diagnosis not present

## 2021-04-19 DIAGNOSIS — M7989 Other specified soft tissue disorders: Secondary | ICD-10-CM | POA: Diagnosis not present

## 2021-04-20 ENCOUNTER — Other Ambulatory Visit: Payer: Self-pay | Admitting: Medical

## 2021-04-20 DIAGNOSIS — M25522 Pain in left elbow: Secondary | ICD-10-CM

## 2021-04-29 ENCOUNTER — Encounter: Payer: Self-pay | Admitting: Family Medicine

## 2021-05-13 DIAGNOSIS — M25522 Pain in left elbow: Secondary | ICD-10-CM | POA: Diagnosis not present

## 2021-06-24 DIAGNOSIS — M1711 Unilateral primary osteoarthritis, right knee: Secondary | ICD-10-CM | POA: Diagnosis not present

## 2021-08-12 DIAGNOSIS — F332 Major depressive disorder, recurrent severe without psychotic features: Secondary | ICD-10-CM | POA: Diagnosis not present

## 2021-08-12 DIAGNOSIS — F4310 Post-traumatic stress disorder, unspecified: Secondary | ICD-10-CM | POA: Diagnosis not present

## 2021-08-18 DIAGNOSIS — F4310 Post-traumatic stress disorder, unspecified: Secondary | ICD-10-CM | POA: Diagnosis not present

## 2021-08-18 DIAGNOSIS — F332 Major depressive disorder, recurrent severe without psychotic features: Secondary | ICD-10-CM | POA: Diagnosis not present

## 2021-08-27 DIAGNOSIS — F332 Major depressive disorder, recurrent severe without psychotic features: Secondary | ICD-10-CM | POA: Diagnosis not present

## 2021-08-27 DIAGNOSIS — F4310 Post-traumatic stress disorder, unspecified: Secondary | ICD-10-CM | POA: Diagnosis not present

## 2021-09-10 DIAGNOSIS — F332 Major depressive disorder, recurrent severe without psychotic features: Secondary | ICD-10-CM | POA: Diagnosis not present

## 2021-09-10 DIAGNOSIS — F4310 Post-traumatic stress disorder, unspecified: Secondary | ICD-10-CM | POA: Diagnosis not present

## 2021-09-14 DIAGNOSIS — F332 Major depressive disorder, recurrent severe without psychotic features: Secondary | ICD-10-CM | POA: Diagnosis not present

## 2021-09-14 DIAGNOSIS — F4310 Post-traumatic stress disorder, unspecified: Secondary | ICD-10-CM | POA: Diagnosis not present

## 2021-09-28 DIAGNOSIS — F332 Major depressive disorder, recurrent severe without psychotic features: Secondary | ICD-10-CM | POA: Diagnosis not present

## 2021-09-28 DIAGNOSIS — F4310 Post-traumatic stress disorder, unspecified: Secondary | ICD-10-CM | POA: Diagnosis not present

## 2021-10-08 DIAGNOSIS — F332 Major depressive disorder, recurrent severe without psychotic features: Secondary | ICD-10-CM | POA: Diagnosis not present

## 2021-10-08 DIAGNOSIS — F4310 Post-traumatic stress disorder, unspecified: Secondary | ICD-10-CM | POA: Diagnosis not present

## 2021-10-26 DIAGNOSIS — F4310 Post-traumatic stress disorder, unspecified: Secondary | ICD-10-CM | POA: Diagnosis not present

## 2021-10-26 DIAGNOSIS — F332 Major depressive disorder, recurrent severe without psychotic features: Secondary | ICD-10-CM | POA: Diagnosis not present

## 2021-11-03 DIAGNOSIS — F332 Major depressive disorder, recurrent severe without psychotic features: Secondary | ICD-10-CM | POA: Diagnosis not present

## 2021-11-03 DIAGNOSIS — F4310 Post-traumatic stress disorder, unspecified: Secondary | ICD-10-CM | POA: Diagnosis not present

## 2021-11-15 ENCOUNTER — Emergency Department (HOSPITAL_BASED_OUTPATIENT_CLINIC_OR_DEPARTMENT_OTHER)
Admission: EM | Admit: 2021-11-15 | Discharge: 2021-11-15 | Disposition: A | Discharge status: Home / Self Care | Payer: Federal, State, Local not specified - PPO | Attending: Emergency Medicine | Admitting: Emergency Medicine

## 2021-11-15 ENCOUNTER — Encounter (HOSPITAL_BASED_OUTPATIENT_CLINIC_OR_DEPARTMENT_OTHER): Payer: Self-pay

## 2021-11-15 ENCOUNTER — Emergency Department (HOSPITAL_BASED_OUTPATIENT_CLINIC_OR_DEPARTMENT_OTHER): Payer: Federal, State, Local not specified - PPO | Admitting: Radiology

## 2021-11-15 ENCOUNTER — Other Ambulatory Visit: Payer: Self-pay

## 2021-11-15 DIAGNOSIS — K449 Diaphragmatic hernia without obstruction or gangrene: Secondary | ICD-10-CM | POA: Diagnosis not present

## 2021-11-15 DIAGNOSIS — F419 Anxiety disorder, unspecified: Secondary | ICD-10-CM | POA: Insufficient documentation

## 2021-11-15 DIAGNOSIS — R002 Palpitations: Secondary | ICD-10-CM | POA: Insufficient documentation

## 2021-11-15 DIAGNOSIS — R0789 Other chest pain: Secondary | ICD-10-CM | POA: Insufficient documentation

## 2021-11-15 DIAGNOSIS — R079 Chest pain, unspecified: Secondary | ICD-10-CM | POA: Diagnosis not present

## 2021-11-15 LAB — BASIC METABOLIC PANEL
Anion gap: 8 (ref 5–15)
BUN: 16 mg/dL (ref 8–23)
CO2: 28 mmol/L (ref 22–32)
Calcium: 9.2 mg/dL (ref 8.9–10.3)
Chloride: 106 mmol/L (ref 98–111)
Creatinine, Ser: 0.78 mg/dL (ref 0.44–1.00)
GFR, Estimated: >60 mL/min (ref >60)
Glucose, Bld: 136 mg/dL — ABNORMAL HIGH (ref 70–99)
Potassium: 4 mmol/L (ref 3.5–5.1)
Sodium: 142 mmol/L (ref 135–145)

## 2021-11-15 LAB — TROPONIN I (HIGH SENSITIVITY)
Troponin I (High Sensitivity): <2 ng/L (ref <18)
Troponin I (High Sensitivity): 2 ng/L (ref <18)

## 2021-11-15 LAB — CBC
HCT: 40.8 % (ref 36.0–46.0)
Hemoglobin: 12.8 g/dL (ref 12.0–15.0)
MCH: 27.5 pg (ref 26.0–34.0)
MCHC: 31.4 g/dL (ref 30.0–36.0)
MCV: 87.6 fL (ref 80.0–100.0)
Platelets: 321 10*3/uL (ref 150–400)
RBC: 4.66 MIL/uL (ref 3.87–5.11)
RDW: 13.8 % (ref 11.5–15.5)
WBC: 6.9 10*3/uL (ref 4.0–10.5)
nRBC: 0 % (ref 0.0–0.2)

## 2021-11-15 MED ORDER — LIDOCAINE VISCOUS HCL 2 % MT SOLN
15.0000 mL | Freq: Once | OROMUCOSAL | Status: AC
  Administered 2021-11-15: 15 mL via ORAL

## 2021-11-15 MED ORDER — KETOROLAC TROMETHAMINE 15 MG/ML IJ SOLN
15.0000 mg | Freq: Once | INTRAMUSCULAR | Status: AC
  Administered 2021-11-15: 15 mg via INTRAVENOUS
  Filled 2021-11-15: qty 1

## 2021-11-15 MED ORDER — ALUM & MAG HYDROXIDE-SIMETH 200-200-20 MG/5ML PO SUSP
30.0000 mL | Freq: Once | ORAL | Status: AC
  Administered 2021-11-15: 30 mL via ORAL

## 2021-11-15 MED ORDER — LORAZEPAM 1 MG PO TABS
1.0000 mg | ORAL_TABLET | Freq: Once | ORAL | Status: AC
  Administered 2021-11-15: 1 mg via ORAL
  Filled 2021-11-15: qty 1

## 2021-11-15 MED ORDER — LACTATED RINGERS IV BOLUS
1000.0000 mL | Freq: Once | INTRAVENOUS | Status: AC
  Administered 2021-11-15: 1000 mL via INTRAVENOUS

## 2021-11-15 NOTE — ED Provider Notes (Signed)
?[facility] EMERGENCY DEPT ?Provider Note ? ? ?CSN: [id number] ?Arrival date & time: 11/15/21  1801 ? ?  ? ?History ? ?Chief Complaint  ?Patient presents with  ? Chest Pain  ? ? ?[name] is a 66 y.o. female. ? ? ?Chest Pain ?Associated symptoms: nausea   ? ?66 year old female with a medical history significant for GERD, depression, anxiety, PTSD who presents to the emergency department with roughly 2 to 3 weeks of chest discomfort.  The patient endorses right-sided chest discomfort, worse with palpation.  She had intermittent discomfort in this area and it had subsided but returned 1 hour prior to arrival to the emergency department so she decided to get it evaluated.  She endorses sharp discomfort in the right side of her chest that is nonradiating.  No chest pressure.  No lower extremity swelling, fevers, chills, cough.  She does endorse intermittent episodes of shortness of breath and palpitations and nausea.  She states that she has been under a a lot of stress recently with relationship difficulties.  She feels safe in her home.  She denies any SI, HI or AVH. ? ?Home Medications ?Prior to Admission medications   ?Medication Sig Start Date End Date Taking? Authorizing Provider  ?Buprenorphine HCl (BELBUCA) 450 MCG FILM Place 450 mcg inside cheek 2 (two) times daily.    [provider]  ?SUMAtriptan (IMITREX) 100 MG tablet Take 1 tablet (100 mg total) by mouth every 6 (six) hours as needed for migraine or headache. May repeat in 2 hours if headache persists or recurs. 11/09/20   Laurey Morale, MD  ?traZODone (DESYREL) 50 MG tablet Take 0.5 tablets (25 mg total) by mouth at bedtime as needed for sleep. 12/23/20   Laurey Morale, MD  ?   ? ?Allergies    ?Hydrocodone, Hydromorphone, Meperidine, Demerol, and Meperidine hcl   ? ?Review of Systems   ?Review of Systems  ?Respiratory:  Positive for chest tightness.   ?Cardiovascular:  Positive for chest pain.  ?Gastrointestinal:  Positive  for nausea.  ?All other systems reviewed and are negative. ? ?Physical Exam ?Updated Vital Signs ?BP 124/80   Pulse 81   Temp 98 ?F (36.7 ?C)   Resp 15   Ht '5\' 1"'$  (1.549 m)   Wt 71 kg   SpO2 99%   BMI 29.58 kg/m?  ?Physical Exam ?Vitals and nursing note reviewed. Exam conducted with a chaperone present.  ?Constitutional:   ?   General: She is not in acute distress. ?   Appearance: She is well-developed.  ?HENT:  ?   Head: Normocephalic and atraumatic.  ?Eyes:  ?   Conjunctiva/sclera: Conjunctivae normal.  ?Cardiovascular:  ?   Rate and Rhythm: Normal rate and regular rhythm.  ?   Heart sounds: No murmur heard. ?Pulmonary:  ?   Effort: Pulmonary effort is normal. No respiratory distress.  ?   Breath sounds: Normal breath sounds.  ?Chest:  ?   Comments: Reproducible right-sided chest wall tenderness to palpation.   ?Abdominal:  ?   Palpations: Abdomen is soft.  ?   Tenderness: There is no abdominal tenderness.  ?Musculoskeletal:     ?   General: No swelling.  ?   Cervical back: Neck supple.  ?Skin: ?   General: Skin is warm and dry.  ?   Capillary Refill: Capillary refill takes less than 2 seconds.  ?Neurological:  ?   Mental Status: She is alert.  ?Psychiatric:     ?  Mood and Affect: Mood normal.  ? ? ?ED Results / Procedures / Treatments   ?Labs ?(all labs ordered are listed, but only abnormal results are displayed) ?Labs Reviewed  ?BASIC METABOLIC PANEL - Abnormal; Notable for the following components:  ?    Result Value  ? Glucose, Bld 136 (*)   ? All other components within normal limits  ?CBC  ?TROPONIN I (HIGH SENSITIVITY)  ?TROPONIN I (HIGH SENSITIVITY)  ? ? ?EKG ?EKG Interpretation ? ?Date/Time:  Monday November 15 2021 18:09:47 EST ?Ventricular Rate:  93 ?PR Interval:  134 ?QRS Duration: 90 ?QT Interval:  338 ?QTC Calculation: 420 ?R Axis:   35 ?Text Interpretation: Normal sinus rhythm Normal ECG When compared with ECG of 23-Jul-2020 22:08, PREVIOUS ECG IS PRESENT Confirmed by Regan Lemming ([id number]) on  11/15/2021 6:54:41 PM ? ?Radiology ?DG Chest 2 View ? ?Result Date: 11/15/2021 ?CLINICAL DATA:  Chest pain. EXAM: CHEST - 2 VIEW COMPARISON:  July 24, 2020. FINDINGS: The heart size and mediastinal contours are within normal limits. Moderate size hiatal hernia is noted. Both lungs are clear. The visualized skeletal structures are unremarkable. IMPRESSION: No active cardiopulmonary disease.  Moderate size hiatal hernia. Electronically Signed   By: Marijo Conception M.D.   On: 11/15/2021 18:34   ? ?Procedures ?Procedures  ? ? ?Medications Ordered in ED ?Medications  ?LORazepam (ATIVAN) tablet 1 mg (1 mg Oral Given 11/15/21 2216)  ?lactated ringers bolus 1,000 mL (0 mLs Intravenous Stopped 11/15/21 2336)  ?ketorolac (TORADOL) 15 MG/ML injection 15 mg (15 mg Intravenous Given 11/15/21 2221)  ?alum & mag hydroxide-simeth (MAALOX/MYLANTA) 200-200-20 MG/5ML suspension 30 mL (30 mLs Oral Given 11/15/21 2219)  ?  And  ?lidocaine (XYLOCAINE) 2 % viscous mouth solution 15 mL (15 mLs Oral Given 11/15/21 2219)  ? ? ?ED Course/ Medical Decision Making/ A&P ?  ?                        ?Medical Decision Making ?Amount and/or Complexity of Data Reviewed ?Labs: ordered. ?Radiology: ordered. ? ?Risk ?OTC drugs. ?Prescription drug management. ? ? ?66 year old female with a medical history significant for GERD, depression, anxiety, PTSD who presents to the emergency department with roughly 2 to 3 weeks of chest discomfort.  The patient endorses right-sided chest discomfort, worse with palpation.  She had intermittent discomfort in this area and it had subsided but returned 1 hour prior to arrival to the emergency department so she decided to get it evaluated.  She endorses sharp discomfort in the right side of her chest that is nonradiating.  No chest pressure.  No lower extremity swelling, fevers, chills, cough.  She does endorse intermittent episodes of shortness of breath and palpitations and nausea.  She states that she has been under a a lot  of stress recently with relationship difficulties.  She feels safe in her home.  She denies any SI, HI or AVH. ? ?On arrival, the patient was vitally stable, sinus rhythm noted on cardiac telemetry.  Patient presenting with a physical exam concerning for musculoskeletal chest pain with reproducible tenderness to palpation of the right chest wall.  Additionally, the patient endorses anxiety and episodes of palpitations consistent with likely panic attacks vs anxiety.  ? ?EKG on arrival revealed normal sinus rhythm, ventricular rate 93, no ischemic changes noted.  A chest x-ray was performed and reviewed by myself and radiology and revealed no active cardiopulmonary disease.  Screening laboratory work-up performed to include delta troponins,  CBC, BMP which were all unremarkable.  The patient was administered an IV fluid bolus, IV Toradol for pain control and viscous Maalox and lidocaine in addition to an Ativan tablet 1 mg. ? ?Patient is overall well-appearing, symptomatically improved following the above interventions.  The patient is presenting with likely noncardiac chest wall pain.  Patient denies SI, HI or AVH. ? ?Symptoms are most consistent with musculoskeletal chest pain and anxiety.  Low suspicion for ACS, PE or other acute intrathoracic abnormality at this time. No SI, HI or AVH.  She does have a hiatal hernia that is present as well.  Feel symptomatically improved following Maalox and lidocaine.  Presenting with symptoms of generalized anxiety in the setting of home stressors presenting with symptoms of generalized anxiety in the setting of home stressors.Patient is overall well-appearing and can follow-up with her PCP to discuss stress testing.   ? ?Final Clinical Impression(s) / ED Diagnoses ?Final diagnoses:  ?Anxiety  ?Chest wall pain  ?Hiatal hernia  ? ? ?Rx / DC Orders ?ED Discharge Orders   ? ? None  ? ?  ? ? ?  ?Regan Lemming, MD ?11/16/21 1828 ? ?

## 2021-11-15 NOTE — ED Triage Notes (Signed)
Patient here POV from Home with CP. ? ?Pain is Mid/Right Chest. Pain began approximately 2-3 weeks ago but subsided. Returned again approximately 1 hour PTA.  ? ?Sharp in nature. Constant when it does occur. Mild SOB and Nausea.  ? ?No Emesis or Fevers. Patient believes it may be stress related due to [facility]. ? ?NAD Noted during Triage. A&Ox4. GCS 15. Ambulatory.  ?

## 2021-11-16 DIAGNOSIS — F332 Major depressive disorder, recurrent severe without psychotic features: Secondary | ICD-10-CM | POA: Diagnosis not present

## 2021-11-16 DIAGNOSIS — F4310 Post-traumatic stress disorder, unspecified: Secondary | ICD-10-CM | POA: Diagnosis not present

## 2021-11-30 DIAGNOSIS — F332 Major depressive disorder, recurrent severe without psychotic features: Secondary | ICD-10-CM | POA: Diagnosis not present

## 2021-11-30 DIAGNOSIS — F4310 Post-traumatic stress disorder, unspecified: Secondary | ICD-10-CM | POA: Diagnosis not present

## 2021-12-06 DIAGNOSIS — L299 Pruritus, unspecified: Secondary | ICD-10-CM | POA: Diagnosis not present

## 2021-12-06 DIAGNOSIS — L255 Unspecified contact dermatitis due to plants, except food: Secondary | ICD-10-CM | POA: Diagnosis not present

## 2021-12-14 DIAGNOSIS — F4310 Post-traumatic stress disorder, unspecified: Secondary | ICD-10-CM | POA: Diagnosis not present

## 2021-12-14 DIAGNOSIS — F332 Major depressive disorder, recurrent severe without psychotic features: Secondary | ICD-10-CM | POA: Diagnosis not present

## 2022-02-04 DIAGNOSIS — M25522 Pain in left elbow: Secondary | ICD-10-CM | POA: Diagnosis not present

## 2022-02-08 ENCOUNTER — Other Ambulatory Visit (HOSPITAL_COMMUNITY): Payer: Self-pay | Admitting: Medical

## 2022-02-08 ENCOUNTER — Other Ambulatory Visit: Payer: Self-pay | Admitting: Medical

## 2022-02-08 DIAGNOSIS — M25522 Pain in left elbow: Secondary | ICD-10-CM

## 2022-02-10 ENCOUNTER — Ambulatory Visit (HOSPITAL_COMMUNITY)
Admission: RE | Admit: 2022-02-10 | Discharge: 2022-02-10 | Disposition: A | Discharge status: Home / Self Care | Payer: Federal, State, Local not specified - PPO | Source: Ambulatory Visit | Attending: Medical | Admitting: Medical

## 2022-02-10 DIAGNOSIS — M25522 Pain in left elbow: Secondary | ICD-10-CM | POA: Diagnosis not present

## 2022-04-11 DIAGNOSIS — Z1231 Encounter for screening mammogram for malignant neoplasm of breast: Secondary | ICD-10-CM | POA: Diagnosis not present

## 2022-04-11 LAB — HM MAMMOGRAPHY

## 2022-04-19 DIAGNOSIS — L728 Other follicular cysts of the skin and subcutaneous tissue: Secondary | ICD-10-CM | POA: Diagnosis not present

## 2022-04-19 DIAGNOSIS — D23112 Other benign neoplasm of skin of right lower eyelid, including canthus: Secondary | ICD-10-CM | POA: Diagnosis not present

## 2022-04-19 DIAGNOSIS — L718 Other rosacea: Secondary | ICD-10-CM | POA: Diagnosis not present

## 2022-05-04 ENCOUNTER — Encounter: Payer: Self-pay | Admitting: Family Medicine

## 2022-09-19 DIAGNOSIS — M25511 Pain in right shoulder: Secondary | ICD-10-CM | POA: Diagnosis not present

## 2022-09-26 ENCOUNTER — Other Ambulatory Visit: Payer: Self-pay

## 2022-09-26 ENCOUNTER — Emergency Department (HOSPITAL_BASED_OUTPATIENT_CLINIC_OR_DEPARTMENT_OTHER)
Admission: EM | Admit: 2022-09-26 | Discharge: 2022-09-26 | Disposition: A | Discharge status: Home / Self Care | Payer: Federal, State, Local not specified - PPO | Attending: Emergency Medicine | Admitting: Emergency Medicine

## 2022-09-26 ENCOUNTER — Encounter (HOSPITAL_BASED_OUTPATIENT_CLINIC_OR_DEPARTMENT_OTHER): Payer: Self-pay | Admitting: Emergency Medicine

## 2022-09-26 ENCOUNTER — Emergency Department (HOSPITAL_BASED_OUTPATIENT_CLINIC_OR_DEPARTMENT_OTHER): Payer: Federal, State, Local not specified - PPO | Admitting: Radiology

## 2022-09-26 DIAGNOSIS — R0781 Pleurodynia: Secondary | ICD-10-CM | POA: Diagnosis not present

## 2022-09-26 DIAGNOSIS — R079 Chest pain, unspecified: Secondary | ICD-10-CM | POA: Diagnosis not present

## 2022-09-26 DIAGNOSIS — R109 Unspecified abdominal pain: Secondary | ICD-10-CM | POA: Diagnosis not present

## 2022-09-26 DIAGNOSIS — K449 Diaphragmatic hernia without obstruction or gangrene: Secondary | ICD-10-CM

## 2022-09-26 DIAGNOSIS — R1012 Left upper quadrant pain: Secondary | ICD-10-CM | POA: Diagnosis not present

## 2022-09-26 LAB — COMPREHENSIVE METABOLIC PANEL
ALT: 13 U/L (ref 0–44)
AST: 16 U/L (ref 15–41)
Albumin: 4.2 g/dL (ref 3.5–5.0)
Alkaline Phosphatase: 81 U/L (ref 38–126)
Anion gap: 11 (ref 5–15)
BUN: 16 mg/dL (ref 8–23)
CO2: 26 mmol/L (ref 22–32)
Calcium: 9.8 mg/dL (ref 8.9–10.3)
Chloride: 103 mmol/L (ref 98–111)
Creatinine, Ser: 0.82 mg/dL (ref 0.44–1.00)
GFR, Estimated: >60 mL/min (ref >60)
Glucose, Bld: 125 mg/dL — ABNORMAL HIGH (ref 70–99)
Potassium: 4.1 mmol/L (ref 3.5–5.1)
Sodium: 140 mmol/L (ref 135–145)
Total Bilirubin: 0.5 mg/dL (ref 0.3–1.2)
Total Protein: 6.9 g/dL (ref 6.5–8.1)

## 2022-09-26 LAB — CBC
HCT: 41.4 % (ref 36.0–46.0)
Hemoglobin: 12.9 g/dL (ref 12.0–15.0)
MCH: 26.9 pg (ref 26.0–34.0)
MCHC: 31.2 g/dL (ref 30.0–36.0)
MCV: 86.4 fL (ref 80.0–100.0)
Platelets: 337 10*3/uL (ref 150–400)
RBC: 4.79 MIL/uL (ref 3.87–5.11)
RDW: 15.5 % (ref 11.5–15.5)
WBC: 7.4 10*3/uL (ref 4.0–10.5)
nRBC: 0 % (ref 0.0–0.2)

## 2022-09-26 LAB — LIPASE, BLOOD: Lipase: 40 U/L (ref 11–51)

## 2022-09-26 NOTE — ED Triage Notes (Signed)
Pt arrives to ED with c/o abdominal pain x2 months.

## 2022-09-26 NOTE — Discharge Instructions (Addendum)
You were seen today for left-sided pain.  Your blood work was normal.  Your chest x-ray shows a hiatal hernia, which is the kind where your stomach slides up through the diaphragm. I recommend discussing this further with your primary care doctor.

## 2022-09-26 NOTE — ED Provider Notes (Signed)
[facility]PT Provider Note   CSN: [id number] Arrival date & time: 09/26/22  1113    History  Chief Complaint  Patient presents with   Abdominal Pain    [name] is a 67 y.o. female with PMH significant for anxiety, depression, prediabetes, GERD presenting with abdominal pain.  Patient reports left-sided upper abdominal pain over the past 1.5 to 2 months.  Over the past 3 days has noticed her left ribs are tender to the touch which prompted today's visit.  States she has a PCP appointment tomorrow but wanted to have imaging done today.  Patient is certain her pain is related to a hernia.  No nausea, vomiting, diarrhea, urinary symptoms, fever, or shortness of breath.  No trauma or injury to the area.  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     Home Medications Prior to Admission medications   Medication Sig Start Date End Date Taking? Authorizing Provider  Buprenorphine HCl (BELBUCA) 450 MCG FILM Place 450 mcg inside cheek 2 (two) times daily.    [provider]  SUMAtriptan (IMITREX) 100 MG tablet Take 1 tablet (100 mg total) by mouth every 6 (six) hours as needed for migraine or headache. May repeat in 2 hours if headache persists or recurs. 11/09/20   Laurey Morale, MD  traZODone (DESYREL) 50 MG tablet Take 0.5 tablets (25 mg total) by mouth at bedtime as needed for sleep. 12/23/20   Laurey Morale, MD      Allergies    Hydrocodone, Hydromorphone, Meperidine, Demerol, and Meperidine hcl    Review of Systems   Review of Systems  Constitutional:  Negative for fever.  Respiratory:  Negative for cough and shortness of breath.   Cardiovascular:  Negative for chest pain.  Gastrointestinal:  Positive for abdominal pain. Negative for diarrhea, nausea and vomiting.  Genitourinary:  Negative for dysuria.  Skin:  Negative for rash.  Neurological:  Negative for weakness and headaches.    Physical  Exam Updated Vital Signs BP 128/76 (BP Location: Right Arm)   Pulse 86   Temp 98.2 F (36.8 C) (Oral)   Resp 16   Ht '5\' 2"'$  (1.575 m)   Wt 73.5 kg   SpO2 99%   BMI 29.63 kg/m  Physical Exam Constitutional:      General: She is not in acute distress. HENT:     Head: Normocephalic and atraumatic.     Mouth/Throat:     Mouth: Mucous membranes are moist.     Pharynx: Oropharynx is clear.  Cardiovascular:     Rate and Rhythm: Normal rate and regular rhythm.     Heart sounds: Normal heart sounds.  Pulmonary:     Effort: Pulmonary effort is normal.     Breath sounds: Normal breath sounds.  Abdominal:     General: Abdomen is flat. Bowel sounds are normal. There is no distension.     Palpations: Abdomen is soft.     Tenderness: There is no abdominal tenderness.     Hernia: No hernia is present.  Musculoskeletal:     Comments: Mild tenderness to palpation over left lateral ribs in the region of 9th-10th ribs.  Full range of motion of the trunk  Skin:    General: Skin is warm.     Findings: No rash.  Neurological:     General: No focal deficit present.     Mental Status: She is alert.  ED Results / Procedures / Treatments   Labs (all labs ordered are listed, but only abnormal results are displayed) Labs Reviewed  COMPREHENSIVE METABOLIC PANEL - Abnormal; Notable for the following components:      Result Value   Glucose, Bld 125 (*)    All other components within normal limits  LIPASE, BLOOD  CBC    EKG None  Radiology DG Chest 2 View  Result Date: 09/26/2022 CLINICAL DATA:  Chest pain.  Abdominal pain for 2 months. EXAM: CHEST - 2 VIEW COMPARISON:  11/15/2021 and older FINDINGS: Moderate hiatal hernia again identified. No consolidation, pneumothorax or effusion. No edema. Degenerative changes seen along the spine. IMPRESSION: Hiatal hernia.  No acute cardiopulmonary disease Electronically Signed   By: Jill Side M.D.   On: 09/26/2022 12:48     Procedures Procedures    Medications Ordered in ED Medications - No data to display  ED Course/ Medical Decision Making/ A&P                             Medical Decision Making This is a 67 year old female with PMH significant for anxiety, depression, and GERD presenting with left upper abdominal pain x2 months, now with "tenderness" over her left lateral rib region.  Patient concerned she has a hernia and would like imaging for this.  Reassuringly, vitals within normal limits, physical exam largely unremarkable aside from slight tenderness in the area of her left lateral chest wall/rib cage.  Overall presentation suggestive of musculoskeletal etiology.  Differential also includes GERD, hiatal hernia, somatic complaint.  Will obtain basic blood work including CBC, CMP, lipase as well as chest x-ray.  Labs reviewed. CBC, CMP, and lipase within normal limits.  Chest x-ray with no acute findings although does demonstrate moderate-sized hiatal hernia.  No emergent intervention required.  Advised PCP follow-up which is already scheduled, patient agreeable with this plan.  Stable for discharge home at this time.  Amount and/or Complexity of Data Reviewed Labs: ordered. Radiology: ordered.    Final Clinical Impression(s) / ED Diagnoses Final diagnoses:  Rib pain on left side  Hiatal hernia    Rx / DC Orders ED Discharge Orders     None         Alcus Dad, MD 09/26/22 [place], [place], DO 09/26/22 1519

## 2022-09-27 ENCOUNTER — Ambulatory Visit: Payer: Federal, State, Local not specified - PPO | Admitting: Family Medicine

## 2022-10-21 ENCOUNTER — Encounter: Payer: Federal, State, Local not specified - PPO | Admitting: Family Medicine

## 2022-10-31 DIAGNOSIS — M25511 Pain in right shoulder: Secondary | ICD-10-CM | POA: Diagnosis not present

## 2022-10-31 DIAGNOSIS — M542 Cervicalgia: Secondary | ICD-10-CM | POA: Diagnosis not present

## 2022-11-14 DIAGNOSIS — M542 Cervicalgia: Secondary | ICD-10-CM | POA: Diagnosis not present

## 2022-11-14 DIAGNOSIS — M25511 Pain in right shoulder: Secondary | ICD-10-CM | POA: Diagnosis not present

## 2022-11-16 ENCOUNTER — Ambulatory Visit (INDEPENDENT_AMBULATORY_CARE_PROVIDER_SITE_OTHER): Payer: Federal, State, Local not specified - PPO | Admitting: Family Medicine

## 2022-11-16 ENCOUNTER — Encounter: Payer: Self-pay | Admitting: Family Medicine

## 2022-11-16 VITALS — BP 98/72 | HR 71 | Temp 98.7°F | Ht 61.25 in | Wt 179.0 lb

## 2022-11-16 DIAGNOSIS — M542 Cervicalgia: Secondary | ICD-10-CM | POA: Diagnosis not present

## 2022-11-16 DIAGNOSIS — Z0000 Encounter for general adult medical examination without abnormal findings: Secondary | ICD-10-CM | POA: Diagnosis not present

## 2022-11-16 DIAGNOSIS — K449 Diaphragmatic hernia without obstruction or gangrene: Secondary | ICD-10-CM

## 2022-11-16 DIAGNOSIS — M25511 Pain in right shoulder: Secondary | ICD-10-CM | POA: Diagnosis not present

## 2022-11-16 LAB — URINALYSIS, ROUTINE W REFLEX MICROSCOPIC
Bilirubin Urine: NEGATIVE
Hgb urine dipstick: NEGATIVE
Ketones, ur: NEGATIVE
Nitrite: NEGATIVE
Specific Gravity, Urine: 1.015 (ref 1.000–1.030)
Total Protein, Urine: NEGATIVE
Urine Glucose: NEGATIVE
Urobilinogen, UA: 0.2 (ref 0.0–1.0)
pH: 6.5 (ref 5.0–8.0)

## 2022-11-16 LAB — LIPID PANEL
Cholesterol: 175 mg/dL (ref 0–200)
HDL: 57.3 mg/dL (ref >39.00)
LDL Cholesterol: 95 mg/dL (ref 0–99)
NonHDL: 117.42
Total CHOL/HDL Ratio: 3
Triglycerides: 114 mg/dL (ref 0.0–149.0)
VLDL: 22.8 mg/dL (ref 0.0–40.0)

## 2022-11-16 LAB — BASIC METABOLIC PANEL
BUN: 15 mg/dL (ref 6–23)
CO2: 25 mEq/L (ref 19–32)
Calcium: 9.6 mg/dL (ref 8.4–10.5)
Chloride: 102 mEq/L (ref 96–112)
Creatinine, Ser: 0.78 mg/dL (ref 0.40–1.20)
GFR: 80.22 mL/min (ref >60.00)
Glucose, Bld: 99 mg/dL (ref 70–99)
Potassium: 4.2 mEq/L (ref 3.5–5.1)
Sodium: 139 mEq/L (ref 135–145)

## 2022-11-16 LAB — HEPATIC FUNCTION PANEL
ALT: 14 U/L (ref 0–35)
AST: 22 U/L (ref 0–37)
Albumin: 3.9 g/dL (ref 3.5–5.2)
Alkaline Phosphatase: 68 U/L (ref 39–117)
Bilirubin, Direct: 0.1 mg/dL (ref 0.0–0.3)
Total Bilirubin: 0.5 mg/dL (ref 0.2–1.2)
Total Protein: 6.8 g/dL (ref 6.0–8.3)

## 2022-11-16 LAB — CBC WITH DIFFERENTIAL/PLATELET
Basophils Absolute: 0 10*3/uL (ref 0.0–0.1)
Basophils Relative: 0.6 % (ref 0.0–3.0)
Eosinophils Absolute: 0.1 10*3/uL (ref 0.0–0.7)
Eosinophils Relative: 1.6 % (ref 0.0–5.0)
HCT: 38.5 % (ref 36.0–46.0)
Hemoglobin: 12.3 g/dL (ref 12.0–15.0)
Lymphocytes Relative: 37.8 % (ref 12.0–46.0)
Lymphs Abs: 2.2 10*3/uL (ref 0.7–4.0)
MCHC: 31.9 g/dL (ref 30.0–36.0)
MCV: 82.1 fl (ref 78.0–100.0)
Monocytes Absolute: 0.4 10*3/uL (ref 0.1–1.0)
Monocytes Relative: 7.4 % (ref 3.0–12.0)
Neutro Abs: 3.1 10*3/uL (ref 1.4–7.7)
Neutrophils Relative %: 52.6 % (ref 43.0–77.0)
Platelets: 287 10*3/uL (ref 150.0–400.0)
RBC: 4.69 Mil/uL (ref 3.87–5.11)
RDW: 16.1 % — ABNORMAL HIGH (ref 11.5–15.5)
WBC: 5.9 10*3/uL (ref 4.0–10.5)

## 2022-11-16 LAB — HEMOGLOBIN A1C: Hgb A1c MFr Bld: 6.5 % (ref 4.6–6.5)

## 2022-11-16 LAB — TSH: TSH: 1.93 u[IU]/mL (ref 0.35–5.50)

## 2022-11-16 NOTE — Progress Notes (Signed)
Subjective:    Patient ID: [name], female    DOB: Nov 12, 1955, 67 y.o.   MRN: [id number]  HPI Here for a well exam. She complains of intermittent upper abdominal pain along with SOB shortly after eating a meal. No heartburn or nausea. She has a known moderate sized hiatal hernia, last seen on a CXR on 09-26-22. She is seeing Dr. Justice Britain for right shoulder pain, and this has been shown to be due to a torn rotator cuff. They are considering surgery for this sometime soon. Her migraines are stable. She continues to see Dr. Debarah Crape in [place] for chronic pain in the neck.   Review of Systems  Constitutional: Negative.   HENT: Negative.    Eyes: Negative.   Respiratory: Negative.    Cardiovascular: Negative.   Gastrointestinal:  Positive for abdominal pain. Negative for abdominal distention, blood in stool, constipation, diarrhea, nausea and vomiting.  Genitourinary:  Negative for decreased urine volume, difficulty urinating, dyspareunia, dysuria, enuresis, flank pain, frequency, hematuria, pelvic pain and urgency.  Musculoskeletal:  Positive for neck pain.  Skin: Negative.   Neurological: Negative.  Negative for headaches.  Psychiatric/Behavioral: Negative.         Objective:   Physical Exam Constitutional:      General: She is not in acute distress.    Appearance: Normal appearance. She is well-developed.  HENT:     Head: Normocephalic and atraumatic.     Right Ear: External ear normal.     Left Ear: External ear normal.     Nose: Nose normal.     Mouth/Throat:     Pharynx: No oropharyngeal exudate.  Eyes:     General: No scleral icterus.    Conjunctiva/sclera: Conjunctivae normal.     Pupils: Pupils are equal, round, and reactive to light.  Neck:     Thyroid: No thyromegaly.     Vascular: No JVD.  Cardiovascular:     Rate and Rhythm: Normal rate and regular rhythm.     Heart sounds: Normal heart sounds. No murmur heard.    No friction rub. No  gallop.  Pulmonary:     Effort: Pulmonary effort is normal. No respiratory distress.     Breath sounds: Normal breath sounds. No wheezing or rales.  Chest:     Chest wall: No tenderness.  Abdominal:     General: Bowel sounds are normal. There is no distension.     Palpations: Abdomen is soft. There is no mass.     Tenderness: There is no abdominal tenderness. There is no guarding or rebound.  Musculoskeletal:        General: No tenderness. Normal range of motion.     Cervical back: Normal range of motion and neck supple.  Lymphadenopathy:     Cervical: No cervical adenopathy.  Skin:    General: Skin is warm and dry.     Findings: No erythema or rash.  Neurological:     General: No focal deficit present.     Mental Status: She is alert and oriented to person, place, and time.     Cranial Nerves: No cranial nerve deficit.     Motor: No abnormal muscle tone.     Coordination: Coordination normal.     Deep Tendon Reflexes: Reflexes are normal and symmetric. Reflexes normal.  Psychiatric:        Behavior: Behavior normal.        Thought Content: Thought content normal.  Judgment: Judgment normal.           Assessment & Plan:  Well exam. We discussed diet and exercise. Get fasting labs. We will refer her to GI for the hiatal hernia. She is scheduled for a colonoscopy this December, so possibly they could perform upper and lower endoscopy at the some time.  Alysia Penna, MD

## 2022-11-17 ENCOUNTER — Telehealth: Payer: Self-pay

## 2022-11-17 ENCOUNTER — Other Ambulatory Visit: Payer: Self-pay

## 2022-11-17 MED ORDER — CIPROFLOXACIN HCL 100 MG PO TABS: 500.0000 mg | ORAL_TABLET | Freq: Two times a day (BID) | ORAL | 0 refills | Status: DC

## 2022-11-17 NOTE — Telephone Encounter (Signed)
Reviewed lab results with pt verbalized understanding. Copy of labs placed at the front for pt to pick up

## 2022-11-17 NOTE — Progress Notes (Signed)
Reviewed lab results with pt, New Rx for Cipro was sent in to pt pharmacy, pt aware to take BID for 7 days.

## 2022-12-05 ENCOUNTER — Telehealth: Payer: Self-pay | Admitting: Family Medicine

## 2022-12-05 NOTE — Telephone Encounter (Signed)
Spoke with patient, she stated when she finished  the prescription Cipro she began experiencing urine urgency, tingling, sensitivity at her urethra area, but no blood.   Patient stated that before she was diagnosed with  the UTI on 11/17/22, she never experienced any symptoms.  Please advise

## 2022-12-05 NOTE — Telephone Encounter (Signed)
Pt called to say the medication prescribed on 3/06 or 3/07 for her UTI did not work and she really needs something stronger sent to the: [id number] - [place], [place] Phone: [phone]  Fax: [phone]

## 2022-12-06 MED ORDER — SULFAMETHOXAZOLE-TRIMETHOPRIM 800-160 MG PO TABS: 1.0000 | ORAL_TABLET | Freq: Two times a day (BID) | ORAL | 0 refills | Status: DC

## 2022-12-06 NOTE — Telephone Encounter (Signed)
Spoke with the patient and informed her the Rx was [facility] to [facility] as below.

## 2022-12-06 NOTE — Telephone Encounter (Signed)
Call in Bactrim DS BID for 7 days  

## 2023-01-04 ENCOUNTER — Telehealth: Payer: Self-pay | Admitting: Family Medicine

## 2023-01-04 NOTE — Telephone Encounter (Signed)
Patient requesting a recheck of her urine, dx with a UTI last appointment and still experiencing symptoms

## 2023-01-06 ENCOUNTER — Other Ambulatory Visit (INDEPENDENT_AMBULATORY_CARE_PROVIDER_SITE_OTHER): Payer: Federal, State, Local not specified - PPO

## 2023-01-06 ENCOUNTER — Telehealth: Payer: Self-pay | Admitting: Family Medicine

## 2023-01-06 DIAGNOSIS — M2011 Hallux valgus (acquired), right foot: Secondary | ICD-10-CM | POA: Diagnosis not present

## 2023-01-06 DIAGNOSIS — M2021 Hallux rigidus, right foot: Secondary | ICD-10-CM | POA: Diagnosis not present

## 2023-01-06 DIAGNOSIS — R300 Dysuria: Secondary | ICD-10-CM

## 2023-01-06 LAB — POC URINALSYSI DIPSTICK (AUTOMATED)
Bilirubin, UA: NEGATIVE
Blood, UA: NEGATIVE
Glucose, UA: NEGATIVE
Ketones, UA: NEGATIVE
Nitrite, UA: NEGATIVE
Protein, UA: POSITIVE — AB
Spec Grav, UA: 1.02 (ref 1.010–1.025)
Urobilinogen, UA: 0.2 E.U./dL
pH, UA: 5 (ref 5.0–8.0)

## 2023-01-06 NOTE — Telephone Encounter (Signed)
Call in another 7 days of Bactrim DS BID

## 2023-01-06 NOTE — Telephone Encounter (Signed)
Pt came by the lab today and urine was collected. Urine  Results documented on pt chart and urine sent out for culture. Pt requests for Dr Clent Ridges to send medication for her. Please advise

## 2023-01-06 NOTE — Telephone Encounter (Signed)
Pt has been scheduled for Lab appointment, pt will give Urine for testing

## 2023-01-06 NOTE — Telephone Encounter (Signed)
Patient states she took only one day of the 2nd prescription for the UTI and then lost the prescription.  She said that prescription may or may not have helped if she would have taken it all.  She wanted me to pass that along to Dr. Clent Ridges please.

## 2023-01-09 LAB — URINE CULTURE
MICRO NUMBER:: 14879812
SPECIMEN QUALITY:: ADEQUATE

## 2023-01-10 MED ORDER — NITROFURANTOIN MONOHYD MACRO 100 MG PO CAPS: 100.0000 mg | ORAL_CAPSULE | Freq: Two times a day (BID) | ORAL | 0 refills | Status: DC

## 2023-01-11 ENCOUNTER — Other Ambulatory Visit: Payer: Self-pay

## 2023-01-11 DIAGNOSIS — K219 Gastro-esophageal reflux disease without esophagitis: Secondary | ICD-10-CM | POA: Diagnosis not present

## 2023-01-11 DIAGNOSIS — R0789 Other chest pain: Secondary | ICD-10-CM | POA: Diagnosis not present

## 2023-01-11 DIAGNOSIS — K449 Diaphragmatic hernia without obstruction or gangrene: Secondary | ICD-10-CM | POA: Diagnosis not present

## 2023-01-11 MED ORDER — SULFAMETHOXAZOLE-TRIMETHOPRIM 800-160 MG PO TABS: 1.0000 | ORAL_TABLET | Freq: Two times a day (BID) | ORAL | 0 refills | Status: DC

## 2023-01-11 NOTE — Telephone Encounter (Signed)
Rx sent 

## 2023-01-11 NOTE — Telephone Encounter (Signed)
Pt.notified

## 2023-01-18 DIAGNOSIS — M79671 Pain in right foot: Secondary | ICD-10-CM | POA: Diagnosis not present

## 2023-01-24 ENCOUNTER — Other Ambulatory Visit: Payer: Self-pay | Admitting: Gastroenterology

## 2023-01-24 DIAGNOSIS — K219 Gastro-esophageal reflux disease without esophagitis: Secondary | ICD-10-CM

## 2023-01-24 DIAGNOSIS — K449 Diaphragmatic hernia without obstruction or gangrene: Secondary | ICD-10-CM

## 2023-01-26 DIAGNOSIS — M2011 Hallux valgus (acquired), right foot: Secondary | ICD-10-CM | POA: Diagnosis not present

## 2023-01-26 DIAGNOSIS — M2021 Hallux rigidus, right foot: Secondary | ICD-10-CM | POA: Diagnosis not present

## 2023-04-04 ENCOUNTER — Encounter (HOSPITAL_BASED_OUTPATIENT_CLINIC_OR_DEPARTMENT_OTHER): Payer: Self-pay | Admitting: Emergency Medicine

## 2023-04-04 ENCOUNTER — Telehealth: Payer: Self-pay | Admitting: Family Medicine

## 2023-04-04 ENCOUNTER — Other Ambulatory Visit: Payer: Self-pay

## 2023-04-04 ENCOUNTER — Emergency Department (HOSPITAL_BASED_OUTPATIENT_CLINIC_OR_DEPARTMENT_OTHER): Payer: Federal, State, Local not specified - PPO

## 2023-04-04 DIAGNOSIS — R0789 Other chest pain: Secondary | ICD-10-CM | POA: Diagnosis not present

## 2023-04-04 DIAGNOSIS — K529 Noninfective gastroenteritis and colitis, unspecified: Secondary | ICD-10-CM | POA: Diagnosis not present

## 2023-04-04 DIAGNOSIS — K5900 Constipation, unspecified: Secondary | ICD-10-CM | POA: Diagnosis not present

## 2023-04-04 DIAGNOSIS — K449 Diaphragmatic hernia without obstruction or gangrene: Secondary | ICD-10-CM | POA: Insufficient documentation

## 2023-04-04 DIAGNOSIS — K5730 Diverticulosis of large intestine without perforation or abscess without bleeding: Secondary | ICD-10-CM | POA: Diagnosis not present

## 2023-04-04 DIAGNOSIS — R1013 Epigastric pain: Secondary | ICD-10-CM | POA: Diagnosis not present

## 2023-04-04 DIAGNOSIS — R079 Chest pain, unspecified: Secondary | ICD-10-CM | POA: Diagnosis not present

## 2023-04-04 DIAGNOSIS — R109 Unspecified abdominal pain: Secondary | ICD-10-CM | POA: Diagnosis not present

## 2023-04-04 LAB — BASIC METABOLIC PANEL
Anion gap: 9 (ref 5–15)
BUN: 15 mg/dL (ref 8–23)
CO2: 26 mmol/L (ref 22–32)
Calcium: 9.1 mg/dL (ref 8.9–10.3)
Chloride: 104 mmol/L (ref 98–111)
Creatinine, Ser: 1.01 mg/dL — ABNORMAL HIGH (ref 0.44–1.00)
GFR, Estimated: >60 mL/min (ref >60)
Glucose, Bld: 158 mg/dL — ABNORMAL HIGH (ref 70–99)
Potassium: 4.3 mmol/L (ref 3.5–5.1)
Sodium: 139 mmol/L (ref 135–145)

## 2023-04-04 LAB — CBC
HCT: 32.8 % — ABNORMAL LOW (ref 36.0–46.0)
Hemoglobin: 9.7 g/dL — ABNORMAL LOW (ref 12.0–15.0)
MCH: 23.5 pg — ABNORMAL LOW (ref 26.0–34.0)
MCHC: 29.6 g/dL — ABNORMAL LOW (ref 30.0–36.0)
MCV: 79.6 fL — ABNORMAL LOW (ref 80.0–100.0)
Platelets: 328 10*3/uL (ref 150–400)
RBC: 4.12 MIL/uL (ref 3.87–5.11)
RDW: 16.4 % — ABNORMAL HIGH (ref 11.5–15.5)
WBC: 8 10*3/uL (ref 4.0–10.5)
nRBC: 0 % (ref 0.0–0.2)

## 2023-04-04 LAB — TROPONIN I (HIGH SENSITIVITY): Troponin I (High Sensitivity): 3 ng/L (ref <18)

## 2023-04-04 NOTE — ED Triage Notes (Signed)
Chest pain since 6pm, is getting worse, if standing its not as bad. Has Hx of a hernia and had similar pain with.

## 2023-04-04 NOTE — Telephone Encounter (Signed)
Says hernia was discussed at last appointment, requesting referral to [facility]  [phone]

## 2023-04-04 NOTE — Telephone Encounter (Signed)
At that visit in March I put in a referral to [facility] to assess this. Has she heard from them yet?

## 2023-04-05 ENCOUNTER — Emergency Department (HOSPITAL_BASED_OUTPATIENT_CLINIC_OR_DEPARTMENT_OTHER): Payer: Federal, State, Local not specified - PPO

## 2023-04-05 ENCOUNTER — Other Ambulatory Visit: Payer: Self-pay

## 2023-04-05 ENCOUNTER — Emergency Department (HOSPITAL_BASED_OUTPATIENT_CLINIC_OR_DEPARTMENT_OTHER)
Admission: EM | Admit: 2023-04-05 | Discharge: 2023-04-05 | Disposition: A | Discharge status: Home / Self Care | Payer: Federal, State, Local not specified - PPO | Attending: Emergency Medicine | Admitting: Emergency Medicine

## 2023-04-05 DIAGNOSIS — K449 Diaphragmatic hernia without obstruction or gangrene: Secondary | ICD-10-CM

## 2023-04-05 DIAGNOSIS — K5900 Constipation, unspecified: Secondary | ICD-10-CM | POA: Diagnosis not present

## 2023-04-05 DIAGNOSIS — R109 Unspecified abdominal pain: Secondary | ICD-10-CM | POA: Diagnosis not present

## 2023-04-05 DIAGNOSIS — R0789 Other chest pain: Secondary | ICD-10-CM

## 2023-04-05 DIAGNOSIS — K529 Noninfective gastroenteritis and colitis, unspecified: Secondary | ICD-10-CM | POA: Diagnosis not present

## 2023-04-05 DIAGNOSIS — K5730 Diverticulosis of large intestine without perforation or abscess without bleeding: Secondary | ICD-10-CM | POA: Diagnosis not present

## 2023-04-05 LAB — URINALYSIS, MICROSCOPIC (REFLEX): Bacteria, UA: NONE SEEN

## 2023-04-05 LAB — URINALYSIS, ROUTINE W REFLEX MICROSCOPIC
Bilirubin Urine: NEGATIVE
Glucose, UA: NEGATIVE mg/dL
Hgb urine dipstick: NEGATIVE
Ketones, ur: NEGATIVE mg/dL
Nitrite: NEGATIVE
Protein, ur: NEGATIVE mg/dL
Specific Gravity, Urine: 1.02 (ref 1.005–1.030)
pH: 7 (ref 5.0–8.0)

## 2023-04-05 LAB — TROPONIN I (HIGH SENSITIVITY): Troponin I (High Sensitivity): 4 ng/L (ref <18)

## 2023-04-05 MED ORDER — IOHEXOL 300 MG/ML  SOLN
100.0000 mL | Freq: Once | INTRAMUSCULAR | Status: AC | PRN
  Administered 2023-04-05: 100 mL via INTRAVENOUS

## 2023-04-05 MED ORDER — SODIUM CHLORIDE 0.9 % IV BOLUS
1000.0000 mL | Freq: Once | INTRAVENOUS | Status: AC
  Administered 2023-04-05: 1000 mL via INTRAVENOUS

## 2023-04-05 NOTE — ED Provider Notes (Signed)
[facility] AT [facility] Provider Note   CSN: [id number] Arrival date & time: 04/04/23  2207     History  Chief Complaint  Patient presents with   Chest Pain    [name] is a 67 y.o. female.  Patient is a 67 year old female with history of hiatal hernia.  Patient presenting today with complaints of abdominal/chest pain.  She describes pain to the epigastric region/lower chest that started approximately 6 PM.  Pain has been constant and worse when she moves or palpates the area.  She feels somewhat short of breath, but denies any diaphoresis.  She does feel nauseated, but has not vomited.  She is concerned she may have an issue with her hiatal hernia.  No recent exertional symptoms.  Patient has no prior cardiac history.  The history is provided by the patient.       Home Medications Prior to Admission medications   Medication Sig Start Date End Date Taking? Authorizing Provider  Buprenorphine HCl (BELBUCA) 450 MCG FILM Place 450 mcg inside cheek 2 (two) times daily. Patient not taking: Reported on 11/16/2022    [provider]  ciprofloxacin (CIPRO) 100 MG tablet Take 5 tablets (500 mg total) by mouth 2 (two) times daily. 11/17/22   Nelwyn Salisbury, MD  ibuprofen (ADVIL) 600 MG tablet Take 600 mg by mouth every 6 (six) hours as needed.    [provider]  nitrofurantoin, macrocrystal-monohydrate, (MACROBID) 100 MG capsule Take 1 capsule (100 mg total) by mouth 2 (two) times daily. 01/10/23   Nelwyn Salisbury, MD  sulfamethoxazole-trimethoprim (BACTRIM DS) 800-160 MG tablet Take 1 tablet by mouth 2 (two) times daily. 12/06/22   Nelwyn Salisbury, MD  sulfamethoxazole-trimethoprim (BACTRIM DS) 800-160 MG tablet Take 1 tablet by mouth 2 (two) times daily. 01/11/23   Nelwyn Salisbury, MD  SUMAtriptan (IMITREX) 100 MG tablet Take 1 tablet (100 mg total) by mouth every 6 (six) hours as needed for migraine or headache. May repeat in 2 hours if  headache persists or recurs. 11/09/20   Nelwyn Salisbury, MD  traZODone (DESYREL) 50 MG tablet Take 0.5 tablets (25 mg total) by mouth at bedtime as needed for sleep. 12/23/20   Nelwyn Salisbury, MD      Allergies    Hydrocodone, Hydromorphone, Meperidine, Demerol, and Meperidine hcl    Review of Systems   Review of Systems  All other systems reviewed and are negative.   Physical Exam Updated Vital Signs BP (!) 148/85 (BP Location: Right Arm)   Pulse (!) 111   Temp 98.9 F (37.2 C) (Oral)   Resp (!) 22   Ht 5\' 1"  (1.549 m)   Wt 76.7 kg   SpO2 99%   BMI 31.93 kg/m  Physical Exam Vitals and nursing note reviewed.  Constitutional:      General: She is not in acute distress.    Appearance: She is well-developed. She is not diaphoretic.  HENT:     Head: Normocephalic and atraumatic.  Cardiovascular:     Rate and Rhythm: Normal rate and regular rhythm.     Heart sounds: No murmur heard.    No friction rub. No gallop.  Pulmonary:     Effort: Pulmonary effort is normal. No respiratory distress.     Breath sounds: Normal breath sounds. No wheezing.  Abdominal:     General: Bowel sounds are normal. There is abdominal bruit. There is no distension.     Palpations: Abdomen  is soft.     Tenderness: There is no abdominal tenderness. There is no guarding or rebound.     Comments: There is tenderness to palpation in the epigastric region.  Musculoskeletal:        General: Normal range of motion.     Cervical back: Normal range of motion and neck supple.  Skin:    General: Skin is warm and dry.  Neurological:     General: No focal deficit present.     Mental Status: She is alert and oriented to person, place, and time.     ED Results / Procedures / Treatments   Labs (all labs ordered are listed, but only abnormal results are displayed) Labs Reviewed  BASIC METABOLIC PANEL - Abnormal; Notable for the following components:      Result Value   Glucose, Bld 158 (*)    Creatinine,  Ser 1.01 (*)    All other components within normal limits  CBC - Abnormal; Notable for the following components:   Hemoglobin 9.7 (*)    HCT 32.8 (*)    MCV 79.6 (*)    MCH 23.5 (*)    MCHC 29.6 (*)    RDW 16.4 (*)    All other components within normal limits  URINALYSIS, ROUTINE W REFLEX MICROSCOPIC  TROPONIN I (HIGH SENSITIVITY)  TROPONIN I (HIGH SENSITIVITY)    EKG None  Radiology DG Chest 2 View  Result Date: 04/04/2023 CLINICAL DATA:  Chest pain EXAM: CHEST - 2 VIEW COMPARISON:  09/26/2022 FINDINGS: The heart size and mediastinal contours are within normal limits. Both lungs are clear. The visualized skeletal structures are unremarkable. Moderate air containing hiatal hernia. IMPRESSION: No active cardiopulmonary disease. Moderate hiatal hernia. Electronically Signed   By: Jasmine Pang M.D.   On: 04/04/2023 22:57    Procedures Procedures    Medications Ordered in ED Medications  sodium chloride 0.9 % bolus 1,000 mL (has no administration in time range)    ED Course/ Medical Decision Making/ A&P  Patient is a 67 year old female presenting with chest/upper abdominal pain as described in the HPI.  She has a history of a hiatal hernia and believes this may be the problem.  Patient arrives here with stable vital signs and is afebrile.  Physical examination reveals some tenderness in the epigastric region, but is otherwise unremarkable.  Workup initiated including CBC, BMP, troponin, all of which were unremarkable.  Chest x-ray shows no acute process.  CT scan also obtained of the abdomen and pelvis showing a moderate to large sized fixed hiatal hernia and gastroenteritis without evidence of small bowel inflammation.  Patient given normal saline and observed here in the ER.  Patient's symptoms are very inconsistent with a cardiac etiology and do not feel as though further workup needs to be performed for this.  I also doubt the hiatal hernia as the cause of her discomfort, but  patient will be referred to [facility] for follow-up.  Final Clinical Impression(s) / ED Diagnoses Final diagnoses:  None    Rx / DC Orders ED Discharge Orders     None         Geoffery Lyons, MD 04/05/23 [id number]

## 2023-04-05 NOTE — ED Notes (Signed)
Patient transported to CT 

## 2023-04-05 NOTE — ED Notes (Signed)
ED Provider at bedside. 

## 2023-04-05 NOTE — Discharge Instructions (Signed)
Take ibuprofen or Tylenol as needed for pain.  Follow-up with general surgery.  The contact information for [facility] has been provided in this discharge summary for you to call and make these arrangements.

## 2023-04-06 NOTE — Telephone Encounter (Signed)
Spoke with pt advised to call [facility] for scheduling, information sent to pt via MyChart

## 2023-04-17 LAB — HM MAMMOGRAPHY

## 2023-04-18 ENCOUNTER — Encounter: Payer: Self-pay | Admitting: Family Medicine

## 2023-09-18 HISTORY — PX: COLONOSCOPY: SHX174

## 2023-09-18 HISTORY — PX: UPPER GI ENDOSCOPY: SHX6162

## 2023-09-18 LAB — HM COLONOSCOPY

## 2023-10-19 ENCOUNTER — Encounter: Payer: Self-pay | Admitting: Dietician

## 2023-10-19 ENCOUNTER — Encounter: Payer: Medicare Other | Attending: Surgery | Admitting: Dietician

## 2023-10-19 VITALS — Ht 61.0 in | Wt 182.9 lb

## 2023-10-19 DIAGNOSIS — R7303 Prediabetes: Secondary | ICD-10-CM | POA: Diagnosis not present

## 2023-10-19 DIAGNOSIS — Z6834 Body mass index (BMI) 34.0-34.9, adult: Secondary | ICD-10-CM | POA: Insufficient documentation

## 2023-10-19 DIAGNOSIS — K449 Diaphragmatic hernia without obstruction or gangrene: Secondary | ICD-10-CM | POA: Insufficient documentation

## 2023-10-19 DIAGNOSIS — Z713 Dietary counseling and surveillance: Secondary | ICD-10-CM | POA: Diagnosis present

## 2023-10-19 DIAGNOSIS — E669 Obesity, unspecified: Secondary | ICD-10-CM

## 2023-10-19 NOTE — Progress Notes (Signed)
 Nutrition Assessment for Bariatric Surgery: Pre-Surgery Behavioral and Nutrition Intervention Program   Medical Nutrition Therapy  Appt Start Time: 1506    End Time: 1617  Patient was seen on 10/19/2023 for Pre-Operative Nutrition Assessment. Purpose of todays visit  enhance perioperative outcomes along with a healthy weight maintenance   Referral stated Supervised Weight Loss (SWL) visits needed: 6 months  Planned surgery: RYGB Pt expectation of surgery: lose weight, improve health  NUTRITION ASSESSMENT   Anthropometrics  Start weight at [facility]: 182.9 lbs (date: 10/19/2023)  Height: 61 in BMI: 34.56 kg/m2     Clinical   Pharmacotherapy: History of weight loss medication used: n/a  Medical hx: obesity, depression GERD, anxiety, cancer, prediabetes Medications: pantoprazole   Labs: A1C 5.9; iron 20; Vit D 20.7; hemoglobin 8.6; hematocrit 30.1 Notable signs/symptoms: none noted Any previous deficiencies? No  Evaluation of Nutritional Deficiencies: Micronutrient Nutrition Focused Physical Exam: Hair: No issues observed Eyes: No issues observed Mouth: No issues observed Neck: No issues observed Nails: No issues observed Skin: No issues observed  Lifestyle & Dietary Hx  Pt states a year ago she weighed 158-162 pounds. Pt states she weight 210 pounds 30 years ago. Patient states she would drink a lot of cokes (10 a day) back then. Pt states she has many ulcers from taking ibuprofen .  Pt states she has been exhausted lately, stating she could sleep 12 hours a day, stating she might occasionally. Pt states she gets migraines occasionally. Pt states she craves ice to crunch.  Current Physical Activity Recommendations state 150 minutes per week of moderate to vigorous movement including Cardio and 1-2 days of resistance activities as well as flexibility/balance activities:  Pts current physical activity: ADLs, with 0% recommendation reached   Sleep Hygiene: duration and quality:  good, pt sates she takes melatonin and sometimes a half of a trazodone  .  Current Patient Perceived Stress Level as stated by pt on a scale of 1-10:  2-3       Stress Management Techniques: states she separates herself from stress. Pt states she enjoys her peace.  According to the Dietary Guidelines for Americans Recommendation: equivalent 1.5-2 cups fruits per day, equivalent 2-3 cups vegetables per day and at least half all grains whole  Fruit servings per day (on average): 0-1, meeting 0-50% recommendation  Non-starchy vegetable servings per day (on average): 0-1, meeting 0-50% recommendation  Whole Grains per day (on average): 0-1  Number of meals missed/skipped per week out of 21: 14  24-Hr Dietary Recall First Meal: skip Snack:  Second Meal: skip or cereal Snack:  Third Meal: fast food (sandwich) or toasted cheese sandwich or fresh market on Sundays (roasted chicken and two sides and corn muffins) Snack: cookies Beverages: water , ice, body armour, whole milk, half a soda (once a month)  Alcoholic beverages per week: 0 (one or two drinks a year)   Estimated Energy Needs Calories: 1500  NUTRITION DIAGNOSIS  Overweight/obesity ([place]-3.3) related to past poor dietary habits and physical inactivity as evidenced by patient w/ planned RYGB surgery following dietary guidelines for continued weight loss.  NUTRITION INTERVENTION  Nutrition counseling (C-1) and education (E-2) to facilitate bariatric surgery goals.  Educated pt on micronutrient deficiencies post-surgery and behavioral/dietary strategies to start in order to mitigate that risk   Behavioral and Dietary Interventions Pre-Op Goals Reviewed with the Patient Nutrition: Healthy Eating Behaviors Switch to non-caloric, non-carbonated and non-caffeinated beverages such as  water , unsweetened tea, Crystal Light and zero calorie beverages (aim for 64 oz.  per day) Cut out grazing between meals or at night  Find a protein shake you  like Eat every 3-5 hours        Eliminate distractions while eating (TV, computer, reading, driving, texting) Take 79-69 minutes to eat a meal  Decrease high sugar foods/decrease high fat/fried foods Eliminate alcoholic beverages Increase protein intake (eggs, fish, chicken, yogurt) before surgery Eat non starchy vegetables 2 times a day 7 days a week Eat complex carbohydrates such as whole grains and fruits   Behavioral Modification: Physical Activity Increase my usual daily activity (use stairs, park farther, etc.) Engage in _______________________  activity  _______ minutes ______ times per week  Other:    _________________________________________________________________     Problem Solving I will think about my usual eating patterns and how to tweak them How can my friends and family support me Barriers to starting my changes Learn and understand appetite verses hunger   Healthy Coping Allow for ___________ activities per week to help me manage stress Reframe negative thoughts I will keep a picture of someone or something that is my inspiration & look at it daily   Monitoring  Weigh myself once a week  Measure my progress by monitoring how my clothes fit Keep a food record of what I eat and drink for the next ________ (time period) Take pictures of what I eat and drink for the next ________ (time period) Use an app to count steps/day for the next_______ (time period) Measure my progress such as increased energy and more restful sleep Monitor your acid reflux and bowel habits, are they getting better?   *Goals that are bolded indicate the pt would like to start working towards these  Handouts Provided Include  Bariatric Surgery handouts (Nutrition Visits, Pre Surgery Behavioral Change Goals, Protein Shakes Brands to Choose From, Vitamins & Mineral Supplementation)  Learning Style & Readiness for Change Teaching method utilized: Visual, Auditory, and hands on  Demonstrated  degree of understanding via: Teach Back  Readiness Level: contemplative  Barriers to learning/adherence to lifestyle change: old habits/ undecided to have surgery  RD's Notes for Next Visit Patient progress toward chosen goals   MONITORING & EVALUATION Dietary intake, weekly physical activity, body weight, and preoperative behavioral change goals   Next Steps  Patient is to follow up at [facility] in two weeds for first SWL visit.

## 2023-10-30 ENCOUNTER — Encounter: Payer: Self-pay | Admitting: Dietician

## 2023-10-30 ENCOUNTER — Encounter: Payer: Medicare Other | Admitting: Dietician

## 2023-10-30 VITALS — Ht 61.0 in | Wt 182.9 lb

## 2023-10-30 DIAGNOSIS — E669 Obesity, unspecified: Secondary | ICD-10-CM | POA: Insufficient documentation

## 2023-10-30 NOTE — Progress Notes (Signed)
 Supervised Weight Loss Visit Bariatric Nutrition Education Appt Start Time: 1458    End Time: 1526  Planned surgery: RYGB (still undecided) Pt expectation of surgery: lose weight, improve health  1 out of 6 SWL Appointments   NUTRITION ASSESSMENT   Anthropometrics  Start weight at [facility]: 182.9 lbs (date: 10/19/2023)  Height: 61 in Weight today: 182.9 lbs BMI: 34.56 kg/m2     Clinical  Medical hx: obesity, depression, GERD, anxiety, cancer, prediabetes Medications: pantoprazole, trazadone Labs: A1C 5.9; iron 20; Vit D 20.7; hemoglobin 8.6; hematocrit 30.1 Notable signs/symptoms: none noted Any previous deficiencies? No  Lifestyle & Dietary Hx  Pt states she bought an iron supplement, stating she hasn't started taking it. Pt states she doesn't do much physical activity, stating she stays exhausted. Pt states she is taking more time with meals and chewing well. Pt states she is not taking 30 minutes. Pt states she is eating more fruits and vegetables, stating she bought raw vegetables to eat. Pt states she has been mindful of eating breakfast.  Estimated daily fluid intake: 30-40 oz Supplements: multivitamin (occasionally), melatonin Current average weekly physical activity: ADLs  24-Hr Dietary Recall First Meal: bowl of cereal with banana Snack:  Second Meal: skip or cereal Snack:  Third Meal: fast food (sandwich) or toasted cheese sandwich or fresh market on Sundays (roasted chicken and two sides and corn muffins) Snack: cookies Beverages: water, ice, whole milk, coffee (10 oz)  Alcoholic beverages per week: 0 (one or two drinks a year)  Estimated Energy Needs Calories: 1500  NUTRITION DIAGNOSIS  Overweight/obesity ([place]-3.3) related to past poor dietary habits and physical inactivity as evidenced by patient w/ planned RYBG surgery following dietary guidelines for continued weight loss.  NUTRITION INTERVENTION  Nutrition counseling (C-1) and education (E-2) to  facilitate bariatric surgery goals.  Encouraged pt to continue to eat balanced meals inclusive of non starchy vegetables 2 times a day 7 days a week Encouraged pt to continue to drink a minium 64 fluid ounces with half being plain water to satisfy proper hydration   Eating smaller portions more often helps prevent large spikes and drops in blood sugar levels, which can be beneficial for people with diabetes or prediabetes. Frequent meals keep the stomach full and prevent feelings of intense hunger, reducing the likelihood of overeating or unhealthy snacking. Increased Metabolism:  Eating more frequently may stimulate the metabolism slightly, helping the body burn calories more efficiently. Consistent food intake throughout the day provides a steady stream of energy, reducing fatigue and improving overall vitality. Smaller meals put less strain on the digestive system, reducing bloating, indigestion, and heartburn. Weight Management:  By controlling hunger and boosting metabolism, frequent small meals can support weight loss or maintenance efforts.  Pre-Op Goals Progress & New Goals New: start taking Vit D New: Eat every 3-5 hours; Continue: Take 20-30 minutes to eat a meal Increase hydrating fluid; aim for plain water; aim for 64 ounces per day. Continue: increase non-starchy vegetables. Continue: choose complex carbohydrates over simple sugars  Handouts Provided Include  Bariatric MyPlate  Learning Style & Readiness for Change Teaching method utilized: Visual & Auditory  Demonstrated degree of understanding via: Teach Back  Readiness Level: contemplative Barriers to learning/adherence to lifestyle change: contemplating wither to have bariatric surgery  RD's Notes for next Visit  Monitor patient progress toward chosen goals  MONITORING & EVALUATION Dietary intake, weekly physical activity, body weight, and pre-op goals in 1 month.   Next Steps  Patient is to  return to [facility] in 1  month for next SWL visit.

## 2023-11-08 ENCOUNTER — Ambulatory Visit (INDEPENDENT_AMBULATORY_CARE_PROVIDER_SITE_OTHER): Payer: Medicare Other | Admitting: Licensed Clinical Social Worker

## 2023-11-08 DIAGNOSIS — F4320 Adjustment disorder, unspecified: Secondary | ICD-10-CM | POA: Diagnosis not present
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
## 2023-11-11 DIAGNOSIS — K2270 Barrett's esophagus without dysplasia: Secondary | ICD-10-CM

## 2023-11-11 HISTORY — DX: Barrett's esophagus without dysplasia: K22.70

## 2023-11-14 HISTORY — PX: UPPER GI ENDOSCOPY: SHX6162

## 2023-11-22 ENCOUNTER — Encounter: Payer: Self-pay | Admitting: Neurology

## 2023-11-22 ENCOUNTER — Ambulatory Visit (INDEPENDENT_AMBULATORY_CARE_PROVIDER_SITE_OTHER): Payer: Medicare Other | Admitting: Neurology

## 2023-11-22 VITALS — BP 124/80 | HR 88 | Ht 61.75 in | Wt 184.0 lb

## 2023-11-22 DIAGNOSIS — E66811 Obesity, class 1: Secondary | ICD-10-CM

## 2023-11-22 DIAGNOSIS — G4700 Insomnia, unspecified: Secondary | ICD-10-CM | POA: Diagnosis not present

## 2023-11-22 DIAGNOSIS — G4719 Other hypersomnia: Secondary | ICD-10-CM | POA: Diagnosis not present

## 2023-11-22 DIAGNOSIS — R519 Headache, unspecified: Secondary | ICD-10-CM

## 2023-11-22 DIAGNOSIS — G8929 Other chronic pain: Secondary | ICD-10-CM

## 2023-11-22 DIAGNOSIS — R351 Nocturia: Secondary | ICD-10-CM

## 2023-11-22 DIAGNOSIS — Z9189 Other specified personal risk factors, not elsewhere classified: Secondary | ICD-10-CM | POA: Diagnosis not present

## 2023-11-22 DIAGNOSIS — R0681 Apnea, not elsewhere classified: Secondary | ICD-10-CM

## 2023-11-22 NOTE — Progress Notes (Signed)
 Subjective:    Patient ID: [name] is a 68 y.o. female.  HPI    Huston Foley, MD, PhD [facility] [address] [address] [place], [place] [address]  Dear Dr. Dossie Der,   I saw your patient, [name], upon your kind request in my sleep clinic today for initial consultation of her sleep disorder, in particular, concern for underlying obstructive sleep apnea.  The patient is unaccompanied today.  As you know, [name] is a 68 year old female with an underlying medical history of chronic neck pain, reflux disease, headaches, arthritis, anxiety, depression, PTSD (per chart review), history of opiate overdose (chart review), pre-diabetes, history of aspiration pneumonia, hiatal hernia, peptic ulcer disease, Barrett's esophagus, and obesity, who reports snoring and excessive daytime somnolence.  In the past, she was told that she has pauses in her breathing while asleep.  Her Epworth sleepiness score is 10 out of 24, fatigue severity score is 52 out of 63.  She is widowed and lives alone.  She has 4 grown children (including her adopted grandson). She is currently staying with her mom who is 80.  She does not have a TV in her bedroom and no pets in her household.  She retired from the [employer] about 14 years ago.  Bedtime is generally between 1 and 2 AM and rise time around 10 AM.  She has nocturia about 2-3 times per average night and has had occasional morning headaches especially coming from the neck.  She uses a support pillow for her neck and without it she will wake up with pain.  She limits her caffeine to 1 cup of coffee in the morning.  She is a non-smoker and drinks alcohol infrequently, 2-3 drinks per year.  She takes melatonin 10 mg nightly and wears a bite guard for bruxism.  I reviewed your office note from 09/21/2023.  Surgical options for her large hiatal hernia were discussed at the time as well as a possible gastric  bypass.  She was found to have gastric ulcers during a recent upper endoscopy contributing to anemia.  She was advised to discontinue ibuprofen and continue with pantoprazole 40 mg twice daily.  She was advised to schedule a repeat EGD with GI.  She is being considered for spinal cord stimulator as I understand for chronic pain.  She has chronic difficulty initiating and maintaining sleep.  She did not melatonin 10 mg at night and takes trazodone in low-dose sparingly, since it makes her groggy.  Her Past Medical History Is Significant For: Past Medical History:  Diagnosis Date   Anxiety    Arthritis    Aspiration pneumonia (HCC)    Barrett esophagus 11/2023   Bronchitis    Cancer (HCC)    precancerous cervix   Chronic neck pain    sees Dr. Eloisa Northern and Ferd Glassing NP at [facility] in [place]    Depression    Difficult intubation    GERD (gastroesophageal reflux disease)    Headache(784.0)    Hiatal hernia    Intentional opiate overdose (HCC) 07/24/2020   Ovarian cyst    Pneumonia    hx x4   PONV (postoperative nausea and vomiting) 08/06/2011   Post traumatic stress disorder (PTSD)    Pre-diabetes    Respiratory failure (HCC)     Her Past Surgical History Is Significant For: Past Surgical History:  Procedure Laterality Date   ABDOMINAL HYSTERECTOMY  1986   CERVIX SURGERY  1986   removed precancerous portion but then ended up having hysterectomy   CESAREAN SECTION  1985   COLONOSCOPY  08/2013   per Dr. Matthias Hughs, 2 benign polyps, repeat in 10 yrs    COLONOSCOPY  09/18/2023   polyp 10 mm; will have to repeat in 3 years   DIRECT LARYNGOSCOPY  08/19/2011   Procedure: DIRECT LARYNGOSCOPY;  Surgeon: Antony Contras;  Location: [facility];  Service: ENT;  Laterality: N/A;   KNEE ARTHROSCOPY WITH SUBCHONDROPLASTY Right 04/02/2019   Procedure: Right knee arthroscopy with chondroplasty and medial tibial plateau subchondroplasty;  Surgeon: Yolonda Kida,  MD;  Location: [facility] OR;  Service: Orthopedics;  Laterality: Right;   KNEE SURGERY  2012   rt arthroscopy   ORIF ELBOW FRACTURE  08/06/2011   Procedure: OPEN REDUCTION INTERNAL FIXATION (ORIF) ELBOW/OLECRANON FRACTURE;  Surgeon: Sharma Covert;  Location: [facility] OR;  Service: Orthopedics;  Laterality: Left;   OVARIAN CYST REMOVAL  1978   stress fracture tibia     repair   TRACHEOSTOMY TUBE PLACEMENT  08/19/2011   Procedure: TRACHEOSTOMY;  Surgeon: Antony Contras;  Location: [facility];  Service: ENT;  Laterality: N/A;  tracheoscopy   UPPER GI ENDOSCOPY  09/18/2023   UPPER GI ENDOSCOPY  11/14/2023    Her Family History Is Significant For: Family History  Problem Relation Age of Onset   Breast cancer Mother    Snoring Mother    Breast cancer Maternal Grandmother    Breast cancer Maternal Aunt    Breast cancer Maternal Aunt    Breast cancer Maternal Aunt    Sleep apnea Neg Hx     Her Social History Is Significant For: Social History   Socioeconomic History   Marital status: Widowed    Spouse name: Not on file   Number of children: 4   Years of education: 3 years college   Highest education level: Not on file  Occupational History   Not on file  Tobacco Use   Smoking status: Never   Smokeless tobacco: Never  Vaping Use   Vaping status: Never Used  Substance and Sexual Activity   Alcohol use: Yes    Comment: 2-3 drinks per year   Drug use: No   Sexual activity: Yes    Birth control/protection: Post-menopausal  Other Topics Concern   Not on file  Social History Narrative   Lives at home alone   Caffeine: 1 cup coffee/day   Social Drivers of Corporate investment banker Strain: Low Risk  (10/06/2023)   Received from [facility]   Overall Financial Resource Strain (CARDIA)    Difficulty of Paying Living Expenses: Not very hard  Food Insecurity: No Food Insecurity (10/06/2023)   Received from [facility]   Hunger Vital Sign    Worried About Running Out of Food in the Last  Year: Never true    Ran Out of Food in the Last Year: Never true  Transportation Needs: No Transportation Needs (10/06/2023)   Received from [facility] - Transportation    Lack of Transportation (Medical): No    Lack of Transportation (Non-Medical): No  Physical Activity: Unknown (10/06/2023)   Received from [facility]   Exercise Vital Sign    Days of Exercise per Week: 0 days    Minutes of Exercise per Session: Not on file  Stress: No Stress Concern Present (10/06/2023)   Received from [facility] - Occupational Stress  Questionnaire    Feeling of Stress : Not at all  Social Connections: Socially Integrated (10/06/2023)   Received from [facility]   Social Network    How would you rate your social network (family, work, friends)?: Good participation with social networks    Her Allergies Are:  Allergies  Allergen Reactions   Hydrocodone Itching    Severe itching - can take with benadryl  Severe itching - can take with benadryl    Hydromorphone Shortness Of Breath and Other (See Comments)    Respiratory distress Other reaction(s): Respiratory Distress Respiratory distress Patient became unresponsive  Respiratory distress    Meperidine Itching   Demerol    Meperidine Hcl Itching    REACTION: hives, itich  :   Her Current Medications Are:  Outpatient Encounter Medications as of 11/22/2023  Medication Sig   APPLE CIDER VINEGAR PO Take by mouth as needed.   BIOTIN PO Take by mouth as needed.   Melatonin 10 MG TABS Take 10 mg by mouth at bedtime.   pantoprazole (PROTONIX) 40 MG tablet Take 40 mg by mouth daily.   SUMAtriptan (IMITREX) 100 MG tablet Take 1 tablet (100 mg total) by mouth every 6 (six) hours as needed for migraine or headache. May repeat in 2 hours if headache persists or recurs.   traZODone (DESYREL) 50 MG tablet Take 0.5 tablets (25 mg total) by mouth at bedtime as needed for sleep. (Patient taking  differently: Take 12.5-25 mg by mouth at bedtime as needed for sleep.)   No facility-administered encounter medications on file as of 11/22/2023.  :   Review of Systems:  Out of a complete 14 point review of systems, all are reviewed and negative with the exception of these symptoms as listed below:   Review of Systems  Neurological:        Patient is here alone for sleep consult. She is currently being evaluated for gastric bypass. This referral is required. The patient states right now her BMI doesn't qualify her for surgery. Patient states she wakes herself up snoring. She does not sleep well. She takes Melatonin 10 mg every night. She has Trazodone PRN but only takes about 12.5-25 mg 2-3 times a month. She avoids it because it makes her feel hungover if she takes 50 mg. She tosses and turns. She wakes up with headaches sometimes but she also states she has arthritis and bone spurs in her neck. She has been more tired but she found out via scope that she has internal bleeding and multiple ulcers, likely from 30+ years of ibuprofen use. She had a repeat scope 1.5 weeks ago and was told the ulcers are healing. She has a hiatal hernia but cannot get it fixed right now due to ulcer complications. She is currently evaluated for a spinal stimulator as she has had low back pain since 1987. She has never had a sleep study. She has no confirmed family history of sleep apnea but her mother falls asleep multiple times a day and snores. ESS 10 FSS 52     Objective:  Neurological Exam  Physical Exam Physical Examination:   Vitals:   11/22/23 1119  BP: 124/80  Pulse: 88    General Examination: The patient is a very pleasant 68 y.o. female in no acute distress. She appears well-developed and well-nourished and well groomed.   HEENT: Normocephalic, atraumatic, pupils are equal, round and reactive to light, extraocular tracking is good without limitation to gaze excursion or nystagmus noted.  Hearing is  grossly intact. Face is symmetric with normal facial animation. Speech is clear with no dysarthria noted. There is no hypophonia. There is no lip, neck/head, jaw or voice tremor. Neck is supple with full range of passive and active motion. There are no carotid bruits on auscultation. Oropharynx exam reveals: mild mouth dryness, good dental hygiene and mild airway crowding, due to small airway entry, tonsils about 1+ bilaterally.  Minimal overbite noted.  Tongue protrudes centrally and palate elevates symmetrically.  Mallampati class II, neck circumference 14 inches.  Chest: Clear to auscultation without wheezing, rhonchi or crackles noted.  Heart: S1+S2+0, regular and normal without murmurs, rubs or gallops noted.   Abdomen: Soft, non-tender and non-distended.  Extremities: There is no pitting edema in the distal lower extremities bilaterally.   Skin: Warm and dry without trophic changes noted.   Musculoskeletal: exam reveals no obvious joint deformities.   Neurologically:  Mental status: The patient is awake, alert and oriented in all 4 spheres. Her immediate and remote memory, attention, language skills and fund of knowledge are appropriate. There is no evidence of aphasia, agnosia, apraxia or anomia. Speech is clear with normal prosody and enunciation. Thought process is linear. Mood is normal and affect is normal.  Cranial nerves II - XII are as described above under HEENT exam.  Motor exam: Normal bulk, strength and tone is noted. There is no obvious action or resting tremor.  Fine motor skills and coordination: grossly intact.  Cerebellar testing: No dysmetria or intention tremor. There is no truncal or gait ataxia.  Sensory exam: intact to light touch in the upper and lower extremities.  Gait, station and balance: She stands easily. No veering to one side is noted. No leaning to one side is noted. Posture is age-appropriate and stance is narrow based. Gait shows normal stride length and  normal pace. No problems turning are noted.   Assessment and Plan:  In summary, Avigail Pilling is a very pleasant 68 y.o.-year old female with an underlying medical history of chronic neck pain, reflux disease, headaches, arthritis, anxiety, depression, PTSD (per chart review), history of opiate overdose (chart review), pre-diabetes, history of aspiration pneumonia, hiatal hernia, ulcer, Barrett's esophagus, and obesity, whose history and physical exam are concerning for sleep disordered breathing, particularly obstructive sleep apnea (OSA). A laboratory attended sleep study is typically considered "gold standard" for evaluation of sleep disordered breathing.   I had a long chat with the patient about my findings and the diagnosis of sleep apnea, particularly OSA, its prognosis and treatment options. We talked about medical/conservative treatments, surgical interventions and non-pharmacological approaches for symptom control. I explained, in particular, the risks and ramifications of untreated moderate to severe OSA, especially with respect to developing cardiovascular disease down the road, including congestive heart failure (CHF), difficult to treat hypertension, cardiac arrhythmias (particularly A-fib), neurovascular complications including TIA, stroke and dementia. Even type 2 diabetes has, in part, been linked to untreated OSA. Symptoms of untreated OSA may include (but may not be limited to) daytime sleepiness, nocturia (i.e. frequent nighttime urination), memory problems, mood irritability and suboptimally controlled or worsening mood disorder such as depression and/or anxiety, lack of energy, lack of motivation, physical discomfort, as well as recurrent headaches, especially morning or nocturnal headaches. We talked about the importance of maintaining a healthy lifestyle and striving for healthy weight. In addition, we talked about the importance of striving for and maintaining good sleep  hygiene. I recommended a sleep study at this time. I  outlined the differences between a laboratory attended sleep study which is considered more comprehensive and accurate over the option of a home sleep test (HST); the latter may lead to underestimation of sleep disordered breathing in some instances and does not help with diagnosing upper airway resistance syndrome and is not accurate enough to diagnose primary central sleep apnea typically. I outlined possible surgical and non-surgical treatment options of OSA, including the use of a positive airway pressure (PAP) device (i.e. CPAP, AutoPAP/APAP or BiPAP in certain circumstances), a custom-made dental device (aka oral appliance, which would require a referral to a specialist dentist or orthodontist typically, and is generally speaking not considered for patients with full dentures or edentulous state), upper airway surgical options, such as traditional UPPP (which is not considered a first-line treatment) or the Inspire device (hypoglossal nerve stimulator, which would involve a referral for consultation with an ENT surgeon, after careful selection, following inclusion criteria - also not first-line treatment). I explained the PAP treatment option to the patient in detail, as this is generally considered first-line treatment.  The patient indicated that she would be willing to try PAP therapy, if the need arises. I explained the importance of being compliant with PAP treatment, not only for insurance purposes but primarily to improve patient's symptoms symptoms, and for the patient's long term health benefit, including to reduce Her cardiovascular risks longer-term.    We will pick up our discussion about the next steps and treatment options after testing.  We will keep her posted as to the test results by phone call and/or MyChart messaging where possible.  We will plan to follow-up in sleep clinic accordingly as well.  I answered all her questions today and  the patient was in agreement.   I encouraged her to call with any interim questions, concerns, problems or updates or email [place] through MyChart.  Generally speaking, sleep test authorizations may take up to 2 weeks, sometimes less, sometimes longer, the patient is encouraged to get in touch with [place] if they do not hear back from the sleep lab staff directly within the next 2 weeks.  Thank you very much for allowing me to participate in the care of this nice patient. If I can be of any further assistance to you please do not hesitate to call me at [phone].  Sincerely,   Huston Foley, MD, PhD

## 2023-11-22 NOTE — Patient Instructions (Signed)

## 2023-11-27 ENCOUNTER — Encounter: Payer: Self-pay | Admitting: Dietician

## 2023-11-27 ENCOUNTER — Encounter: Payer: Federal, State, Local not specified - PPO | Attending: Surgery | Admitting: Dietician

## 2023-11-27 VITALS — Ht 61.0 in | Wt 187.9 lb

## 2023-11-27 DIAGNOSIS — Z6835 Body mass index (BMI) 35.0-35.9, adult: Secondary | ICD-10-CM | POA: Insufficient documentation

## 2023-11-27 DIAGNOSIS — E669 Obesity, unspecified: Secondary | ICD-10-CM | POA: Diagnosis present

## 2023-11-27 DIAGNOSIS — Z713 Dietary counseling and surveillance: Secondary | ICD-10-CM | POA: Diagnosis not present

## 2023-11-27 NOTE — Progress Notes (Signed)
 Supervised Weight Loss Visit Bariatric Nutrition Education Appt Start Time: 1359    End Time: 1428  Planned surgery: RYGB (still undecided) Pt expectation of surgery: lose weight, improve health  2 out of 6 SWL Appointments   NUTRITION ASSESSMENT   Anthropometrics  Start weight at [facility]: 182.9 lbs (date: 10/19/2023)  Height: 61 in Weight today: 187.9 lbs BMI: 35.50 kg/m2     Clinical  Medical hx: obesity, depression, GERD, anxiety, cancer, prediabetes, barrett's esophagus Medications: pantoprazole, trazadone Labs: A1C 5.9; iron 20; Vit D 20.7; hemoglobin 8.6; hematocrit 30.1 Notable signs/symptoms: none noted Any previous deficiencies? No  Lifestyle & Dietary Hx  Pt states she got her biopsy report, stating she has barrett's esophagus. Pt states she is doing better with her fluid intake, stating she is getting an extra 20 oz bottle of water per day. Pt states she has taken a multivitamin 3 times in the last week, stating it is an improvement. Pt states it is a women's multivitamin. Pt states she has not started taking Vit D. Pt states she had a consultation for a sleep study, stating it will take about 2 weeks to get approval for the sleep study.  Estimated daily fluid intake: 60 oz Supplements: multivitamin (occasionally), melatonin Current average weekly physical activity: ADLs  24-Hr Dietary Recall First Meal: bowl of cereal with banana or slice of bundt cake Snack:  Second Meal: skip or cereal or 1/2 of chicken salad sandwich or egg salad sandwich Snack:  Third Meal: fast food (sandwich) or toasted cheese sandwich or fresh market on Sundays (roasted chicken and two sides and corn muffins) or rice with beef stew Snack: cookies or multi-grain chips and honey mustard pretzels. Beverages: water, ice, whole milk, coffee (10 oz)  Alcoholic beverages per week: 0 (one or two drinks a year)  Estimated Energy Needs Calories: 1500  NUTRITION DIAGNOSIS  Overweight/obesity  ([place]-3.3) related to past poor dietary habits and physical inactivity as evidenced by patient w/ planned RYBG surgery following dietary guidelines for continued weight loss.  NUTRITION INTERVENTION  Nutrition counseling (C-1) and education (E-2) to facilitate bariatric surgery goals.  Encouraged pt to continue to eat balanced meals inclusive of non starchy vegetables 2 times a day 7 days a week Encouraged pt to continue to drink a minium 64 fluid ounces with half being plain water to satisfy proper hydration   Eating smaller portions more often helps prevent large spikes and drops in blood sugar levels, which can be beneficial for people with diabetes or prediabetes. Frequent meals keep the stomach full and prevent feelings of intense hunger, reducing the likelihood of overeating or unhealthy snacking. Increased Metabolism:  Eating more frequently may stimulate the metabolism slightly, helping the body burn calories more efficiently. Consistent food intake throughout the day provides a steady stream of energy, reducing fatigue and improving overall vitality. Smaller meals put less strain on the digestive system, reducing bloating, indigestion, and heartburn. Weight Management:  By controlling hunger and boosting metabolism, frequent small meals can support weight loss or maintenance efforts. Understanding the Bariatric Plate Method can support healthier eating habits after bariatric surgery. It recommends filling your plate with 16% protein, 30% non-starchy vegetables or fruits, and 20% whole carbohydrates. This balance aids in portion control and helps meet nutritional needs post-bariatric surgery.  Pre-Op Goals Progress & New Goals Re-engage: start taking Vit D Continue: Eat every 3-5 hours; use the Bariatric MyPlate  to help with portion sizes New: slow down when eating; take 20-30 minutes to  eat a meal Continue: Increase hydrating fluid; aim for plain water; aim for 64 ounces per  day. Continue: increase non-starchy vegetables. Continue: choose complex carbohydrates over simple sugars  Handouts Provided Include  Bariatric MyPlate Meal Ideas Bariatric Multivitamin handout  Learning Style & Readiness for Change Teaching method utilized: Visual & Auditory  Demonstrated degree of understanding via: Teach Back  Readiness Level: contemplative Barriers to learning/adherence to lifestyle change: contemplating wither to have bariatric surgery  RD's Notes for next Visit  Monitor patient progress toward chosen goals  MONITORING & EVALUATION Dietary intake, weekly physical activity, body weight, and pre-op goals in 1 month.   Next Steps  Patient is to return to [facility] in 1 month for next SWL visit.

## 2023-11-29 ENCOUNTER — Telehealth: Payer: Self-pay | Admitting: Neurology

## 2023-11-29 NOTE — Telephone Encounter (Signed)
 [place] BCBS fed no auth req b/c patient has Medicare ref # [place] B   Patient is scheduled at [facility] for 01/02/24 at 9 pm.  Mailed packet to the patient & sent mychart.

## 2023-12-25 ENCOUNTER — Encounter: Payer: Self-pay | Admitting: Dietician

## 2023-12-25 ENCOUNTER — Encounter: Attending: Surgery | Admitting: Dietician

## 2023-12-25 VITALS — Ht 61.0 in | Wt 189.3 lb

## 2023-12-25 DIAGNOSIS — Z713 Dietary counseling and surveillance: Secondary | ICD-10-CM | POA: Insufficient documentation

## 2023-12-25 DIAGNOSIS — E669 Obesity, unspecified: Secondary | ICD-10-CM | POA: Diagnosis present

## 2023-12-25 DIAGNOSIS — Z6835 Body mass index (BMI) 35.0-35.9, adult: Secondary | ICD-10-CM | POA: Diagnosis not present

## 2023-12-25 NOTE — Progress Notes (Signed)
 Supervised Weight Loss Visit Bariatric Nutrition Education Appt Start Time: 1359    End Time: 1428  Planned surgery: RYGB (still undecided) Pt expectation of surgery: lose weight, improve health  3 out of 6 SWL Appointments   NUTRITION ASSESSMENT   Anthropometrics  Start weight at [facility]: 182.9 lbs (date: 10/19/2023)  Height: 61 in Weight today: 189.3 lbs BMI: 35.77 kg/m2     Clinical  Medical hx: obesity, depression, GERD, anxiety, cancer, prediabetes, barrett's esophagus Medications: pantoprazole, trazadone Labs: A1C 5.9; iron 20; Vit D 20.7; hemoglobin 8.6; hematocrit 30.1 Notable signs/symptoms: none noted Any previous deficiencies? No  Lifestyle & Dietary Hx  Pt states she has not been doing well with the sweets, sating everyone at the house wants them and it is hard to say no to them. Pt states she is always tired. Pt stated she has some iron to take, but have not taken it yet. Pt states she started slowing down when eating, stating some days she doesn't focus on that.  Estimated daily fluid intake: 45 oz Supplements: multivitamin (occasionally), melatonin, calcium with Vit D Current average weekly physical activity: ADLs (piddling in the yard)  24-Hr Dietary Recall First Meal: bowl of cereal with banana or slice of bundt cake Snack:  Second Meal: skip or cereal or 1/2 of chicken salad sandwich or egg salad sandwich Snack:  Third Meal: fast food (sandwich) or toasted cheese sandwich or fresh market on Sundays (roasted chicken and two sides and corn muffins) or rice with beef stew Snack: cookies or multi-grain chips and honey mustard pretzels. Beverages: water, ice, whole milk, coffee (10 oz)  Alcoholic beverages per week: 0 (one or two drinks a year)  Estimated Energy Needs Calories: 1500  NUTRITION DIAGNOSIS  Overweight/obesity ([place]-3.3) related to past poor dietary habits and physical inactivity as evidenced by patient w/ planned RYBG surgery following dietary  guidelines for continued weight loss.  NUTRITION INTERVENTION  Nutrition counseling (C-1) and education (E-2) to facilitate bariatric surgery goals.  Encouraged pt to continue to eat balanced meals inclusive of non starchy vegetables 2 times a day 7 days a week. Encouraged pt to continue to drink a minium 64 fluid ounces with half being plain water to satisfy proper hydration. Avoid sugar sweetened beverages. Tracking protein intake is crucial for bariatric patients to ensure adequate nutritional needs and support healthy weight loss. Aim for a minimum of 60-80 grams of protein daily, especially after bariatric surgery, as this can be a challenging macronutrient to meet with reduced stomach capacity. A food journal or app like Baritastic can help monitor protein consumption.  Pre-Op Goals Progress & New Goals Continue: start taking Vit D Continue: Eat every 3-5 hours; use the Bariatric MyPlate  to help with portion sizes Continue: slow down when eating; take 20-30 minutes to eat a meal Re-engage: Increase hydrating fluid; aim for plain water; aim for 64 ounces per day. Continue: increase non-starchy vegetables. Continue: choose complex carbohydrates over simple sugars New: Track protein; aim for 60 grams per day.  Handouts Provided Include    Learning Style & Readiness for Change Teaching method utilized: Visual & Auditory  Demonstrated degree of understanding via: Teach Back  Readiness Level: contemplative Barriers to learning/adherence to lifestyle change: contemplating wither to have bariatric surgery  RD's Notes for next Visit  Monitor patient progress toward chosen goals  MONITORING & EVALUATION Dietary intake, weekly physical activity, body weight, and pre-op goals in 1 month.   Next Steps  Patient is to return to  [facility] in 1 month for next SWL visit.

## 2024-01-01 ENCOUNTER — Telehealth: Payer: Self-pay | Admitting: Neurology

## 2024-01-01 NOTE — Telephone Encounter (Signed)
 Appointment details confirmed

## 2024-01-02 ENCOUNTER — Ambulatory Visit (INDEPENDENT_AMBULATORY_CARE_PROVIDER_SITE_OTHER): Admitting: Neurology

## 2024-01-02 DIAGNOSIS — G4733 Obstructive sleep apnea (adult) (pediatric): Secondary | ICD-10-CM

## 2024-01-02 DIAGNOSIS — G8929 Other chronic pain: Secondary | ICD-10-CM

## 2024-01-02 DIAGNOSIS — R0681 Apnea, not elsewhere classified: Secondary | ICD-10-CM

## 2024-01-02 DIAGNOSIS — Z9189 Other specified personal risk factors, not elsewhere classified: Secondary | ICD-10-CM

## 2024-01-02 DIAGNOSIS — G4719 Other hypersomnia: Secondary | ICD-10-CM

## 2024-01-02 DIAGNOSIS — R519 Headache, unspecified: Secondary | ICD-10-CM

## 2024-01-02 DIAGNOSIS — R351 Nocturia: Secondary | ICD-10-CM

## 2024-01-02 DIAGNOSIS — G4720 Circadian rhythm sleep disorder, unspecified type: Secondary | ICD-10-CM

## 2024-01-02 DIAGNOSIS — E66811 Obesity, class 1: Secondary | ICD-10-CM

## 2024-01-02 DIAGNOSIS — G4700 Insomnia, unspecified: Secondary | ICD-10-CM

## 2024-01-03 ENCOUNTER — Encounter: Admitting: Family Medicine

## 2024-01-09 ENCOUNTER — Encounter: Payer: Self-pay | Admitting: Family Medicine

## 2024-01-09 ENCOUNTER — Ambulatory Visit (INDEPENDENT_AMBULATORY_CARE_PROVIDER_SITE_OTHER): Admitting: Family Medicine

## 2024-01-09 VITALS — BP 118/70 | HR 75 | Ht 61.0 in | Wt 189.0 lb

## 2024-01-09 DIAGNOSIS — K2270 Barrett's esophagus without dysplasia: Secondary | ICD-10-CM | POA: Insufficient documentation

## 2024-01-09 DIAGNOSIS — R739 Hyperglycemia, unspecified: Secondary | ICD-10-CM | POA: Diagnosis not present

## 2024-01-09 DIAGNOSIS — G43909 Migraine, unspecified, not intractable, without status migrainosus: Secondary | ICD-10-CM

## 2024-01-09 DIAGNOSIS — K449 Diaphragmatic hernia without obstruction or gangrene: Secondary | ICD-10-CM

## 2024-01-09 DIAGNOSIS — E785 Hyperlipidemia, unspecified: Secondary | ICD-10-CM | POA: Diagnosis not present

## 2024-01-09 DIAGNOSIS — M25511 Pain in right shoulder: Secondary | ICD-10-CM | POA: Diagnosis not present

## 2024-01-09 DIAGNOSIS — D509 Iron deficiency anemia, unspecified: Secondary | ICD-10-CM | POA: Diagnosis not present

## 2024-01-09 DIAGNOSIS — F411 Generalized anxiety disorder: Secondary | ICD-10-CM

## 2024-01-09 DIAGNOSIS — K269 Duodenal ulcer, unspecified as acute or chronic, without hemorrhage or perforation: Secondary | ICD-10-CM

## 2024-01-09 DIAGNOSIS — K2100 Gastro-esophageal reflux disease with esophagitis, without bleeding: Secondary | ICD-10-CM | POA: Insufficient documentation

## 2024-01-09 DIAGNOSIS — M810 Age-related osteoporosis without current pathological fracture: Secondary | ICD-10-CM

## 2024-01-09 LAB — CBC WITH DIFFERENTIAL/PLATELET
Basophils Absolute: 0 10*3/uL (ref 0.0–0.1)
Basophils Relative: 0.7 % (ref 0.0–3.0)
Eosinophils Absolute: 0.1 10*3/uL (ref 0.0–0.7)
Eosinophils Relative: 1.7 % (ref 0.0–5.0)
HCT: 31 % — ABNORMAL LOW (ref 36.0–46.0)
Hemoglobin: 9.2 g/dL — ABNORMAL LOW (ref 12.0–15.0)
Lymphocytes Relative: 31.3 % (ref 12.0–46.0)
Lymphs Abs: 2 10*3/uL (ref 0.7–4.0)
MCHC: 29.8 g/dL — ABNORMAL LOW (ref 30.0–36.0)
MCV: 66 fl — ABNORMAL LOW (ref 78.0–100.0)
Monocytes Absolute: 0.5 10*3/uL (ref 0.1–1.0)
Monocytes Relative: 8.4 % (ref 3.0–12.0)
Neutro Abs: 3.7 10*3/uL (ref 1.4–7.7)
Neutrophils Relative %: 57.9 % (ref 43.0–77.0)
Platelets: 312 10*3/uL (ref 150.0–400.0)
RBC: 4.69 Mil/uL (ref 3.87–5.11)
RDW: 20.9 % — ABNORMAL HIGH (ref 11.5–15.5)
WBC: 6.4 10*3/uL (ref 4.0–10.5)

## 2024-01-09 LAB — IBC + FERRITIN
Ferritin: 4.2 ng/mL — ABNORMAL LOW (ref 10.0–291.0)
Iron: 23 ug/dL — ABNORMAL LOW (ref 42–145)
Saturation Ratios: 4.1 % — ABNORMAL LOW (ref 20.0–50.0)
TIBC: 565.6 ug/dL — ABNORMAL HIGH (ref 250.0–450.0)
Transferrin: 404 mg/dL — ABNORMAL HIGH (ref 212.0–360.0)

## 2024-01-09 LAB — HEPATIC FUNCTION PANEL
ALT: 13 U/L (ref 0–35)
AST: 23 U/L (ref 0–37)
Albumin: 4.1 g/dL (ref 3.5–5.2)
Alkaline Phosphatase: 77 U/L (ref 39–117)
Bilirubin, Direct: 0.1 mg/dL (ref 0.0–0.3)
Total Bilirubin: 0.6 mg/dL (ref 0.2–1.2)
Total Protein: 7.1 g/dL (ref 6.0–8.3)

## 2024-01-09 LAB — BASIC METABOLIC PANEL WITH GFR
BUN: 15 mg/dL (ref 6–23)
CO2: 27 meq/L (ref 19–32)
Calcium: 9 mg/dL (ref 8.4–10.5)
Chloride: 105 meq/L (ref 96–112)
Creatinine, Ser: 0.8 mg/dL (ref 0.40–1.20)
GFR: 77.2 mL/min (ref >60.00)
Glucose, Bld: 105 mg/dL — ABNORMAL HIGH (ref 70–99)
Potassium: 4.3 meq/L (ref 3.5–5.1)
Sodium: 138 meq/L (ref 135–145)

## 2024-01-09 LAB — TSH: TSH: 1.44 u[IU]/mL (ref 0.35–5.50)

## 2024-01-09 LAB — HEMOGLOBIN A1C: Hgb A1c MFr Bld: 6.6 % — ABNORMAL HIGH (ref 4.6–6.5)

## 2024-01-09 LAB — LIPID PANEL
Cholesterol: 144 mg/dL (ref 0–200)
HDL: 54.1 mg/dL (ref >39.00)
LDL Cholesterol: 75 mg/dL (ref 0–99)
NonHDL: 90.33
Total CHOL/HDL Ratio: 3
Triglycerides: 75 mg/dL (ref 0.0–149.0)
VLDL: 15 mg/dL (ref 0.0–40.0)

## 2024-01-09 MED ORDER — SUMATRIPTAN SUCCINATE 100 MG PO TABS: 100.0000 mg | ORAL_TABLET | Freq: Four times a day (QID) | ORAL | 11 refills | Status: AC | PRN

## 2024-01-09 MED ORDER — PANTOPRAZOLE SODIUM 40 MG PO TBEC: 40.0000 mg | DELAYED_RELEASE_TABLET | Freq: Every day | ORAL | 3 refills | Status: AC

## 2024-01-09 MED ORDER — TRAZODONE HCL 50 MG PO TABS: 25.0000 mg | ORAL_TABLET | Freq: Every evening | ORAL | 11 refills | Status: DC | PRN

## 2024-01-09 NOTE — Procedures (Signed)
 Physician Interpretation:     [facility] POLYSOMNOGRAPHY  INTERPRETATION REPORT   STUDY DATE:  01/02/2024     PATIENT NAME:  [name]         DATE OF BIRTH:  November 06, 1955  PATIENT ID:  [id number]    TYPE OF STUDY:  PSG  READING PHYSICIAN: Debbra Fairy, MD, PhD     SCORING TECHNICIAN: Auston Left, RPSGT   Referred by: Dr. Teddie Favre ? History and Indication for Testing: 68 year old female with an underlying medical history of chronic neck pain, reflux disease, headaches, arthritis, anxiety, depression, PTSD (per chart review), history of opiate overdose (chart review), pre-diabetes, history of aspiration pneumonia, hiatal hernia, peptic ulcer disease, Barrett's esophagus, and obesity, who reports snoring and excessive daytime somnolence. In the past, she was told that she has pauses in her breathing while asleep. Her Epworth sleepiness score is 10 out of 24, fatigue severity score is 52 out of 63. The patient took melatonin and trazodone  prior to lights out. She used a bite guard for the study. Height: 62 in Weight: 184 lb (BMI 33) Neck Size: 14 in    MEDICATIONS: Apple Cider Vinegar, Biotin, Melatonin, Protonix , Imitrex , Desyrel    TECHNICAL DESCRIPTION: A registered sleep technologist was in attendance for the duration of the recording.  Data collection, scoring, video monitoring, and reporting were performed in compliance with the AASM Manual for the Scoring of Sleep and Associated Events; (Hypopnea is scored based on the criteria listed in Section VIII D. 1b in the AASM Manual V2.6 using a 4% oxygen desaturation rule or Hypopnea is scored based on the criteria listed in Section VIII D. 1a in the AASM Manual V2.6 using 3% oxygen desaturation and /or arousal rule).   SLEEP CONTINUITY AND SLEEP ARCHITECTURE:  Lights-out was at 22:02: and lights-on at  05:03:, with a total recording time of 7 hours, 0.5 min. Total sleep time ( TST) was 281.0  minutes with a decreased sleep efficiency at 66.8%. There was  19.6% REM sleep.  BODY POSITION:  TST was divided  between the following sleep positions: 0.0% supine;  100.0% lateral;  0% prone. Duration of total sleep and percent of total sleep in their respective position is as follows: supine 00 minutes (0%), non-supine 281 minutes (100%); right 172 minutes (61%), left 109 minutes (39%), and prone 00 minutes (0%).  Total supine REM sleep time was 00 minutes (0% of total REM sleep).  Sleep latency was increased at 90.0 minutes.  REM sleep latency was increased at 152.5 minutes. Of the total sleep time, the percentage of stage N1 sleep was 5.5%, stage N2 sleep was 64%, which is mildly increased, stage N3 sleep was 10.7%, which is mildly reduced, and REM sleep was 19.6%, which is near-normal. Wake after sleep onset (WASO) time accounted for 49.5 minutes with mild to moderate sleep fragmentation noted.   RESPIRATORY MONITORING:   Based on CMS criteria (using a 4% oxygen desaturation rule for scoring hypopneas), there were 1 apneas (1 obstructive; 0 central; 0 mixed), and 96 hypopneas.  Apnea index was 0.2. Hypopnea index was 20.5. The apnea-hypopnea index was 20.7/hour overall (0.0 supine, 41 non-supine; 41.5 REM, 0.0 supine REM).  There were 0 respiratory effort-related arousals (RERAs).  The RERA index was 0 events/h. Total respiratory disturbance index (RDI) was 20.7 events/h. RDI results showed: supine RDI  0.0 /h; non-supine RDI 20.7 /h; REM RDI 41.5 /h, supine REM RDI 0.0 /h.   Based on AASM criteria (  using a 3% oxygen desaturation and /or arousal rule for scoring hypopneas), there were 1 apneas (1 obstructive; 0 central; 0 mixed), and 148 hypopneas. Apnea index was 0.2. Hypopnea index was 31.6. The apnea-hypopnea index was 31.8 overall (0.0 supine, 50 non-supine; 50.2 REM, 0.0 supine REM).  There were 0 respiratory effort-related arousals (RERAs).  The RERA index was 0 events/h. Total respiratory  disturbance index (RDI) was 31.8 events/h. RDI results showed: supine RDI  0.0 /h; non-supine RDI 31.8 /h; REM RDI 50.2 /h, supine REM RDI 0.0 /h.    OXIMETRY: Oxyhemoglobin Saturation Nadir during sleep was at  73% from a mean of 93%.  Of the Total sleep time (TST)   hypoxemia (=<88%) was present for  5.8 minutes, or 2.1% of total sleep time.    LIMB MOVEMENTS: There were 5 periodic limb movements of sleep (1.1/hr), of which 0 (0.0/hr) were associated with an arousal.   AROUSAL: There were 76 arousals in total, for an arousal index of 16 arousals/hour.  Of these, 23 were identified as respiratory-related arousals (5 /h), 0 were PLM-related arousals (0 /h), and 60 were non-specific arousals (13 /h).   EEG: Review of the EEG showed no abnormal electrical discharges and symmetrical bihemispheric findings.      EKG: The EKG revealed normal sinus rhythm (NSR). The average heart rate during sleep was 83 bpm.     AUDIO/VIDEO REVIEW: The audio and video review did not show any abnormal or unusual behaviors, movements, phonations or vocalizations. The patient took 3 restroom breaks. Snoring was noted, ranging from mild to moderate.    POST-STUDY QUESTIONNAIRE: Post study, the patient indicated, that sleep was the same as usual.   IMPRESSION:    1. Moderate obstructive Sleep Apnea (OSA) 2. Dysfunctions associated with sleep stages or arousal from sleep   RECOMMENDATIONS:    1. This study demonstrates moderate to severe obstructive sleep apnea with a total AHI of 20.7/h, REM AHI of 41.5/hour, and O2 nadir of 73% during nonsupine REM sleep.  The absence of supine sleep during this study may have rendered an underestimation of her sleep disordered breathing.  Treatment with a positive airway pressure (PAP) device is recommended.  The patient will be advised to proceed with home autoPAP therapy for now.  A laboratory attended, full night PAP-titration study can be considered down the road, to optimize  therapy settings, mask fit, monitoring of tolerance and of proper oxygen saturations. Other treatment options may be limited, and may include (generally speaking) surgical options in selected patients. Concomitant weight loss is recommended (where clinically feasible).  2. Please note that untreated obstructive sleep apnea may carry additional perioperative morbidity. Patients with significant obstructive sleep apnea should receive perioperative PAP therapy and the surgeons and particularly the anesthesiologist should be informed of the diagnosis and the severity of the sleep disordered breathing. 3. This study shows mild to moderate sleep fragmentation and mildly abnormal sleep stage percentages; these are nonspecific findings and per se do not signify an intrinsic sleep disorder or a cause for the patient's sleep-related symptoms. Causes include (but are not limited to) the first night effect of the sleep study, circadian rhythm disturbances, medication effect or an underlying mood disorder or medical problem.  4. The patient should be cautioned not to drive, work at heights, or operate dangerous or heavy equipment when tired or sleepy. Review and reiteration of good sleep hygiene measures should be pursued with any patient. 5. The patient will be seen in follow-up in  the sleep clinic at [facility] for discussion of the test results, symptom and treatment compliance review, further management strategies, etc. The patient and the referring provider will be notified of the test results.   I certify that I have reviewed the entire raw data recording prior to the issuance of this report in accordance with the Standards of Accreditation of the American Academy of Sleep Medicine (AASM).  Debbra Fairy, MD, PhD Medical Director, [facility]) Diplomat, ABPN (Neurology and Sleep)               Technical Report:   General Information  Name: Haniyyah, Twardzik BMI:  33.93 Physician: Debbra Fairy, MD  ID: [id number] Height: 61.8 in Technician: Auston Left, RPSGT  Sex: Female Weight: 184.0 lb Record: [id number]  Age: 68 [06/05/1956] Date: 01/02/2024    Medical & Medication History    68 year old female with an underlying medical history of chronic neck pain, reflux disease, headaches, arthritis, anxiety, depression, PTSD (per chart review), history of opiate overdose (chart review), pre-diabetes, history of aspiration pneumonia, hiatal hernia, peptic ulcer disease, Barrett's esophagus, and obesity, who reports snoring and excessive daytime somnolence. In the past, she was told that she has pauses in her breathing while asleep. She takes melatonin 10 mg nightly and wears a bite guard for bruxism. She has chronic difficulty initiating and maintaining sleep. She did not melatonin 10 mg at night and takes trazodone  in low-dose sparingly, since it makes her groggy. Apple Cider Vinegar, Biotin, Melatonin, Protonix , Imitrex , Desyrel    Sleep Disorder      Comments   The patient came into the lab for a PSG. The patient took Melatonin and Trazodone  prior to start of study. Bite guard was used during the study. The patient had three restroom breaks. EKG showed no obvious cardiac arrhythmias. Mild to Moderate snoring. All sleep stages witnessed. Respiratory events scored with a 3% desat. Slept lateral and supine. AHI was 35.5 after 2 hrs of TST. Some leg movements.     Lights out: 10:02:52 PM Lights on: 05:03:27 AM   Time Total Supine Side Prone Upright  Recording (TRT) 7h 0.67m 0h 18.44m 6h 42.41m 0h 0.68m 0h 0.79m  Sleep (TST) 4h 41.49m 0h 0.61m 4h 41.3m 0h 0.14m 0h 0.31m   Latency N1 N2 N3 REM Onset Per. Slp. Eff.  Actual 0h 0.42m 0h 4.79m 1h 42.79m 2h 32.4m 1h 30.77m 2h 2.8m 66.83%   Stg Dur Wake N1 N2 N3 REM  Total 139.5 15.5 180.5 30.0 55.0  Supine 18.0 0.0 0.0 0.0 0.0  Side 121.5 15.5 180.5 30.0 55.0  Prone 0.0 0.0 0.0 0.0 0.0  Upright 0.0 0.0 0.0 0.0 0.0   Stg % Wake  N1 N2 N3 REM  Total 33.2 5.5 64.2 10.7 19.6  Supine 4.3 0.0 0.0 0.0 0.0  Side 28.9 5.5 64.2 10.7 19.6  Prone 0.0 0.0 0.0 0.0 0.0  Upright 0.0 0.0 0.0 0.0 0.0     Apnea Summary Sub Supine Side Prone Upright  Total 1 Total 1 0 1 0 0    REM 0 0 0 0 0    NREM 1 0 1 0 0  Obs 1 REM 0 0 0 0 0    NREM 1 0 1 0 0  Mix 0 REM 0 0 0 0 0    NREM 0 0 0 0 0  Cen 0 REM 0 0 0 0 0    NREM 0 0 0 0 0   Rera  Summary Sub Supine Side Prone Upright  Total 0 Total 0 0 0 0 0    REM 0 0 0 0 0    NREM 0 0 0 0 0   Hypopnea Summary Sub Supine Side Prone Upright  Total 148 Total 148 0 148 0 0    REM 46 0 46 0 0    NREM 102 0 102 0 0   4% Hypopnea Summary Sub Supine Side Prone Upright  Total (4%) 96 Total 96 0 96 0 0    REM 38 0 38 0 0    NREM 58 0 58 0 0     AHI Total Obs Mix Cen  31.81 Apnea 0.21 0.21 0.00 0.00   Hypopnea 31.60 -- -- --  20.71 Hypopnea (4%) 20.50 -- -- --    Total Supine Side Prone Upright  Position AHI 31.81 0.00 31.81 0.00 0.00  REM AHI 50.18   NREM AHI 27.35   Position RDI 31.81 0.00 31.81 0.00 0.00  REM RDI 50.18   NREM RDI 27.35    4% Hypopnea Total Supine Side Prone Upright  Position AHI (4%) 20.71 0.00 20.71 0.00 0.00  REM AHI (4%) 41.45   NREM AHI (4%) 15.66   Position RDI (4%) 20.71 0.00 20.71 0.00 0.00  REM RDI (4%) 41.45   NREM RDI (4%) 15.66    Desaturation Information Threshold: 2% <100% <90% <80% <70% <60% <50% <40%  Supine 7.0 1.0 0.0 0.0 0.0 0.0 0.0  Side 298.0 70.0 2.0 0.0 0.0 0.0 0.0  Prone 0.0 0.0 0.0 0.0 0.0 0.0 0.0  Upright 0.0 0.0 0.0 0.0 0.0 0.0 0.0  Total 305.0 71.0 2.0 0.0 0.0 0.0 0.0  Index 55.4 12.9 0.4 0.0 0.0 0.0 0.0   Threshold: 3% <100% <90% <80% <70% <60% <50% <40%  Supine 6.0 1.0 0.0 0.0 0.0 0.0 0.0  Side 202.0 66.0 2.0 0.0 0.0 0.0 0.0  Prone 0.0 0.0 0.0 0.0 0.0 0.0 0.0  Upright 0.0 0.0 0.0 0.0 0.0 0.0 0.0  Total 208.0 67.0 2.0 0.0 0.0 0.0 0.0  Index 37.8 12.2 0.4 0.0 0.0 0.0 0.0   Threshold: 4% <100% <90% <80% <70% <60% <50%  <40%  Supine 5.0 1.0 0.0 0.0 0.0 0.0 0.0  Side 124.0 59.0 2.0 0.0 0.0 0.0 0.0  Prone 0.0 0.0 0.0 0.0 0.0 0.0 0.0  Upright 0.0 0.0 0.0 0.0 0.0 0.0 0.0  Total 129.0 60.0 2.0 0.0 0.0 0.0 0.0  Index 23.4 10.9 0.4 0.0 0.0 0.0 0.0   Threshold: 3% <100% <90% <80% <70% <60% <50% <40%  Supine 6 1 0 0 0 0 0  Side 202 66 2 0 0 0 0  Prone 0 0 0 0 0 0 0  Upright 0 0 0 0 0 0 0  Total 208 67 2 0 0 0 0   Awakening/Arousal Information # of Awakenings 14  Wake after sleep onset 49.70m  Wake after persistent sleep 26.68m   Arousal Assoc. Arousals Index  Apneas 0 0.0  Hypopneas 23 4.9  Leg Movements 0 0.0  Snore 0 0.0  PTT Arousals 0 0.0  Spontaneous 60 12.8  Total 83 17.7  Leg Movement Information PLMS LMs Index  Total LMs during PLMS 5 1.1  LMs w/ Microarousals 0 0.0   LM LMs Index  w/ Microarousal 0 0.0  w/ Awakening 0 0.0  w/ Resp Event 0 0.0  Spontaneous 17 3.6  Total 17 3.6     Desaturation threshold setting: 3%  Minimum desaturation setting: 10 seconds SaO2 nadir: 73% The longest event was a 111 sec obstructive Hypopnea with a minimum SaO2 of 86%. The lowest SaO2 was 75% associated with a 85 sec obstructive Hypopnea. EKG Rates EKG Avg Max Min  Awake 87 107 74  Asleep 83 97 70  EKG Events: Tachycardia

## 2024-01-09 NOTE — Addendum Note (Signed)
 Addended by: Henning Ehle on: 01/09/2024 06:38 PM   Modules accepted: Orders

## 2024-01-09 NOTE — Progress Notes (Signed)
 Subjective:    Patient ID: [name], female    DOB: 1956-03-06, 68 y.o.   MRN: [id number]  HPI Here to follow up on issues. She has been dealing with a lot of acid reflux, and she had an upper endoscopy on 11-15-23 per Dr. Veronda Goody showing multiple duodenal ulcers and a Barrett's esophagus. She had been taking Pantoprazole  BID, but now she takes this once daily. Her migraines have been stable. She has anemia, and in January her Hgb was 8.6 with an iron of 20. She is seeing Dr. Teddie Favre for a large hiatal hernia, and he is considering doing a surgery to correct this as well as a gastric bypass. She is seeing [facility] for a torn right rotator cuff that will probably require surgery.    Review of Systems  Constitutional: Negative.   HENT: Negative.    Eyes: Negative.   Respiratory: Negative.    Cardiovascular: Negative.   Gastrointestinal: Negative.   Genitourinary:  Negative for decreased urine volume, difficulty urinating, dyspareunia, dysuria, enuresis, flank pain, frequency, hematuria, pelvic pain and urgency.  Musculoskeletal:  Positive for arthralgias.  Skin: Negative.   Neurological:  Positive for headaches.  Psychiatric/Behavioral: Negative.         Objective:   Physical Exam Constitutional:      General: She is not in acute distress.    Appearance: Normal appearance. She is well-developed.  HENT:     Head: Normocephalic and atraumatic.     Right Ear: External ear normal.     Left Ear: External ear normal.     Nose: Nose normal.     Mouth/Throat:     Pharynx: No oropharyngeal exudate.  Eyes:     General: No scleral icterus.    Conjunctiva/sclera: Conjunctivae normal.     Pupils: Pupils are equal, round, and reactive to light.  Neck:     Thyroid : No thyromegaly.     Vascular: No JVD.  Cardiovascular:     Rate and Rhythm: Normal rate and regular rhythm.     Pulses: Normal pulses.     Heart sounds: Normal heart sounds. No murmur heard.     No friction rub. No gallop.  Pulmonary:     Effort: Pulmonary effort is normal. No respiratory distress.     Breath sounds: Normal breath sounds. No wheezing or rales.  Chest:     Chest wall: No tenderness.  Abdominal:     General: Bowel sounds are normal. There is no distension.     Palpations: Abdomen is soft. There is no mass.     Tenderness: There is no abdominal tenderness. There is no guarding or rebound.  Musculoskeletal:        General: No tenderness. Normal range of motion.     Cervical back: Normal range of motion and neck supple.  Lymphadenopathy:     Cervical: No cervical adenopathy.  Skin:    General: Skin is warm and dry.     Findings: No erythema or rash.  Neurological:     General: No focal deficit present.     Mental Status: She is alert and oriented to person, place, and time.     Cranial Nerves: No cranial nerve deficit.     Motor: No abnormal muscle tone.     Coordination: Coordination normal.     Deep Tendon Reflexes: Reflexes are normal and symmetric. Reflexes normal.  Psychiatric:        Mood and Affect: Mood normal.  Behavior: Behavior normal.        Thought Content: Thought content normal.        Judgment: Judgment normal.           Assessment & Plan:  Her migraines and anxiety are stable. She will follow up with Orthopedics for the right shoulder pain. She is deciding whether to pursue surgery to correct a hiatal hernia and a gastric bypass. She has GERD and Barrett's esophagus so she will continue to take Pantoprazole . We will get labs to check her iron deficiency anemia, etc. We spent a total of ( 33  ) minutes reviewing records and discussing these issues.  Corita Diego, MD

## 2024-01-10 ENCOUNTER — Telehealth: Payer: Self-pay | Admitting: *Deleted

## 2024-01-10 NOTE — Telephone Encounter (Signed)
-----   Message from Debbra Fairy sent at 01/09/2024  6:38 PM EDT ----- Patient referred by Dr. Marny Sires, seen by me on 11/22/23, diagnostic PSG on 01/02/24.    Please call and notify the patient that the recent sleep study showed moderate to severe obstructive sleep apnea. I recommend treatment for in the form of autoPAP. We may consider at a CPAP titration study at a later date, if need be, which means, that we would ask her to come back in for a second sleep study with CPAP treatment. For now, I would like to start her on a so-called autoPAP machine at home, through a DME company (of her choice, or as per insurance requirement). The DME representative will educate her on how to use the machine, how to put the mask on, etc. I have placed an order in the chart. Please send referral, talk to patient, send report to referring MD. We will need a FU in sleep clinic for 10 weeks post-PAP set up, please arrange that with me or one of our NPs. Thanks,   Debbra Fairy, MD, PhD [facility])

## 2024-01-10 NOTE — Telephone Encounter (Signed)
 Called pt and discussed her sleep study results as noted below. The patient is amenable to being setup on auto-pap therapy. We discussed the insurance requirements which includes using the machine at least 4 hours at night and also having a 30-90 day follow-up appointment with our office. Pt scheduled a visit for 7/17 at 1:15 pm arrival 1 pm. Her questions were answered. She agreed to setup with [employer]. I have sent the order there and forwarded her sleep study result to the referring provider.

## 2024-01-11 ENCOUNTER — Telehealth: Payer: Self-pay | Admitting: Family Medicine

## 2024-01-11 ENCOUNTER — Encounter: Payer: Self-pay | Admitting: *Deleted

## 2024-01-11 NOTE — Telephone Encounter (Signed)
 Patient dropped off immunization history to be added to file

## 2024-01-11 NOTE — Telephone Encounter (Signed)
 New, Maryella Shivers, Otilio Jefferson, RN; Alain Honey; Jeris Penta, [place]; 1 other Received, thank you!

## 2024-01-12 ENCOUNTER — Telehealth: Payer: Self-pay

## 2024-01-12 DIAGNOSIS — D509 Iron deficiency anemia, unspecified: Secondary | ICD-10-CM

## 2024-01-12 NOTE — Telephone Encounter (Signed)
 Copied from CRM [id number]. Topic: Clinical - Medical Advice >> Jan 12, 2024 11:54 AM Allyne Areola wrote: Reason for CRM: Patient is calling to see if Dr.Fry agrees with an iron infusion and if so would he be able to place the order. Recent lab results shows she's low on iron and has been like this for some time.

## 2024-01-15 ENCOUNTER — Other Ambulatory Visit: Payer: Self-pay | Admitting: Family Medicine

## 2024-01-15 ENCOUNTER — Telehealth: Payer: Self-pay | Admitting: Family Medicine

## 2024-01-15 ENCOUNTER — Telehealth: Payer: Self-pay

## 2024-01-15 NOTE — Telephone Encounter (Signed)
 I ordered the iron infusion. They will contact her

## 2024-01-15 NOTE — Telephone Encounter (Signed)
 Pt has appointment with Infusion on 01/17/24

## 2024-01-15 NOTE — Telephone Encounter (Signed)
 Patient referred to infusion pharmacy team for ambulatory infusion of IV iron.  Insurance - BCBS Dx code - D50.9 IV Iron Therapy - Feraheme 510 mg IV x 2  Infusion appointments - Scheduling team will schedule patient as soon as possible.    Bryce Cheever D. Jeliyah Middlebrooks, PharmD

## 2024-01-15 NOTE — Telephone Encounter (Signed)
 Auth Submission: NO AUTH NEEDED Site of care: Site of care: [facility] Payer: BCBS FEP and Medicare A/B Medication & CPT/J Code(s) submitted: Feraheme (ferumoxytol) [id number] Route of submission (phone, fax, portal): phone Phone # [phone] Fax # Auth type: Buy/Bill PB Units/visits requested: 510mg  x 2 doses Reference number: [id number] Approval from: 01/15/24 to 09/11/24   Medicare A/B will cover 80%, BCBS FEP will cover the remaining 20%.

## 2024-01-15 NOTE — Addendum Note (Signed)
 Addended by: Corita Diego A on: 01/15/2024 10:33 AM   Modules accepted: Orders

## 2024-01-16 NOTE — Telephone Encounter (Signed)
 Done

## 2024-01-17 ENCOUNTER — Ambulatory Visit

## 2024-01-17 VITALS — BP 115/80 | HR 83 | Temp 97.6°F | Resp 16 | Ht 61.0 in | Wt 189.8 lb

## 2024-01-17 DIAGNOSIS — D509 Iron deficiency anemia, unspecified: Secondary | ICD-10-CM

## 2024-01-17 MED ORDER — SODIUM CHLORIDE 0.9 % IV SOLN
510.0000 mg | Freq: Once | INTRAVENOUS | Status: AC
  Administered 2024-01-17: 510 mg via INTRAVENOUS
  Filled 2024-01-17: qty 17

## 2024-01-17 NOTE — Patient Instructions (Signed)

## 2024-01-17 NOTE — Progress Notes (Signed)
 Diagnosis: Acute Anemia  Provider:  Phyllis Breeze MD  Procedure: IV Infusion  IV Type: Peripheral, IV Location: R Forearm  Feraheme (Ferumoxytol), Dose: 510 mg  Infusion Start Time: 1502  Infusion Stop Time: 1523  Post Infusion IV Care: Observation period completed and Peripheral IV Discontinued  Discharge: Condition: Good, Destination: Home . AVS Provided  Performed by:  Shirly Dow, RN

## 2024-01-22 ENCOUNTER — Encounter: Payer: Self-pay | Admitting: Dietician

## 2024-01-22 ENCOUNTER — Encounter: Attending: Surgery | Admitting: Dietician

## 2024-01-22 VITALS — Ht 61.0 in | Wt 194.4 lb

## 2024-01-22 DIAGNOSIS — E669 Obesity, unspecified: Secondary | ICD-10-CM | POA: Diagnosis not present

## 2024-01-22 DIAGNOSIS — Z713 Dietary counseling and surveillance: Secondary | ICD-10-CM | POA: Insufficient documentation

## 2024-01-22 NOTE — Progress Notes (Signed)
 Supervised Weight Loss Visit Bariatric Nutrition Education Appt Start Time: 1554    End Time: 16:30  Planned surgery: RYGB (still undecided) Pt expectation of surgery: lose weight, improve health  4 out of 6 SWL Appointments   NUTRITION ASSESSMENT   Anthropometrics  Start weight at [facility]: 182.9 lbs (date: 10/19/2023)  Height: 61 in Weight today: 194.4 lbs BMI:  kg/m2     Clinical  Medical hx: obesity, depression, GERD, anxiety, cancer, prediabetes, barrett's esophagus Medications: pantoprazole , trazadone Labs: A1C 6.6; iron 20; Vit D 20.7; hemoglobin 9.2; hematocrit 31.0 Notable signs/symptoms: none noted Any previous deficiencies? No  Lifestyle & Dietary Hx  Pt states she had her first iron infusion last week, stating she has one more this Wednesday. Pt states she received her results from the sleep test, stating she is being put on an autoPAP, stating she should be feeling better soon with the iron infusions and better sleep. Pt states she is eating strawberries and squash and whole wheat bread. Pt states she has been tracking her protein some, stating she may get 10 one day and 25 another day. Pt states she is not taking vitamin D, stating she is getting it in a multivitamin for women over 74 years old. Pt states she has been trying to eat breakfast.  Estimated daily fluid intake: 45-50 oz Supplements: multivitamin (occasionally), melatonin, calcium with Vit D Current average weekly physical activity: ADLs (piddling in the yard)  24-Hr Dietary Recall First Meal: bowl of cereal with banana or slice of bundt cake Snack:  Second Meal: skip or cereal or 1/2 of chicken salad sandwich or egg salad sandwich Snack:  Third Meal: fast food (sandwich) or toasted cheese sandwich or fresh market on Sundays (roasted chicken and two sides and corn muffins) or rice with beef stew Snack: cookies or multi-grain chips and honey mustard pretzels, vanilla yogurt and fruit Beverages: water ,  ice, whole milk, coffee (10 oz)  Alcoholic beverages per week: 0 (one or two drinks a year)  Estimated Energy Needs Calories: 1500  NUTRITION DIAGNOSIS  Overweight/obesity ([place]-3.3) related to past poor dietary habits and physical inactivity as evidenced by patient w/ planned RYBG surgery following dietary guidelines for continued weight loss.  NUTRITION INTERVENTION  Nutrition counseling (C-1) and education (E-2) to facilitate bariatric surgery goals.  Encouraged pt to continue to eat balanced meals inclusive of non starchy vegetables 2 times a day 7 days a week. Encouraged pt to continue to drink a minium 64 fluid ounces with half being plain water  to satisfy proper hydration. Avoid sugar sweetened beverages. Tracking protein intake is crucial for bariatric patients to ensure adequate nutritional needs and support healthy weight loss. Aim for a minimum of 60-80 grams of protein daily, especially after bariatric surgery, as this can be a challenging macronutrient to meet with reduced stomach capacity. A food journal or app like Baritastic can help monitor protein consumption. Tracking your fluid intake is very important after bariatric surgery.   Preventing Dehydration: After surgery, your stomach can't hold much fluid, so you need to sip throughout the day to stay hydrated.    Supporting Recovery: Adequate fluids help with healing and prevent complications.    Aiding Digestion: Water  helps your body process food.    Preventing Constipation: Staying hydrated keeps things moving.    Kidney Health: Proper fluid intake is essential for kidney function.     Pre-Op Goals Progress & New Goals Continue: start taking Vit D Continue: Eat every 3-5 hours; use the Bariatric  MyPlate  to help with portion sizes Continue: slow down when eating; take 20-30 minutes to eat a meal Continue: Increase hydrating fluid; aim for plain water ; aim for 64 ounces per day. Continue: increase non-starchy  vegetables. Continue: choose complex carbohydrates over simple sugars Re-engage: Track protein; aim for 60 grams per day. Try Fairlife skim milk New/Continue: practicing not drinking with meals  Handouts Provided Include    Learning Style & Readiness for Change Teaching method utilized: Visual & Auditory  Demonstrated degree of understanding via: Teach Back  Readiness Level: contemplative Barriers to learning/adherence to lifestyle change: contemplating wither to have bariatric surgery  RD's Notes for next Visit  Monitor patient progress toward chosen goals  MONITORING & EVALUATION Dietary intake, weekly physical activity, body weight, and pre-op goals in 1 month.   Next Steps  Patient is to return to [facility] in 1 month for next SWL visit.

## 2024-01-24 ENCOUNTER — Ambulatory Visit

## 2024-01-24 ENCOUNTER — Telehealth: Payer: Self-pay | Admitting: *Deleted

## 2024-01-24 VITALS — BP 100/70 | HR 78 | Temp 97.9°F | Resp 18 | Ht 61.0 in | Wt 189.4 lb

## 2024-01-24 DIAGNOSIS — D509 Iron deficiency anemia, unspecified: Secondary | ICD-10-CM

## 2024-01-24 MED ORDER — SODIUM CHLORIDE 0.9 % IV SOLN
510.0000 mg | Freq: Once | INTRAVENOUS | Status: AC
  Administered 2024-01-24: 510 mg via INTRAVENOUS
  Filled 2024-01-24: qty 17

## 2024-01-24 NOTE — Progress Notes (Signed)
 Diagnosis: Iron Deficiency Anemia  Provider:  Praveen Mannam MD  Procedure: IV Infusion  IV Type: Peripheral, IV Location: R Hand  Feraheme (Ferumoxytol ), Dose: 510 mg  Infusion Start Time: 1447  Infusion Stop Time: 1510  Post Infusion IV Care: Patient declined observation and Peripheral IV Discontinued  Discharge: Condition: Good, Destination: Home . AVS Provided  Performed by:  Norie Latendresse, RN

## 2024-01-24 NOTE — Telephone Encounter (Signed)
 Copied from CRM [id number]. Topic: Clinical - Medication Question >> Jan 24, 2024  2:03 PM Juleen Oakland F wrote: Reason for CRM: Patient would like a medication sent to the [facility] file for genital herpes, says she's having an outbreak. She would like a call back at [phone] (M)

## 2024-01-24 NOTE — Telephone Encounter (Signed)
  Spoke with pt advised that there is no Rx or Diagnoses for this problem. Pt state that this was diagnoses 10 years ago by her GYN, pt is having a flare up. Offered pt appointment at the office but pt  declined.

## 2024-02-02 ENCOUNTER — Other Ambulatory Visit: Payer: Self-pay | Admitting: Family Medicine

## 2024-02-07 ENCOUNTER — Telehealth: Payer: Self-pay

## 2024-02-07 DIAGNOSIS — D509 Iron deficiency anemia, unspecified: Secondary | ICD-10-CM

## 2024-02-07 NOTE — Telephone Encounter (Signed)
 Pt message sent to  PCP for advise ?

## 2024-02-08 ENCOUNTER — Other Ambulatory Visit (INDEPENDENT_AMBULATORY_CARE_PROVIDER_SITE_OTHER)

## 2024-02-08 DIAGNOSIS — D509 Iron deficiency anemia, unspecified: Secondary | ICD-10-CM

## 2024-02-08 LAB — CBC WITH DIFFERENTIAL/PLATELET
Basophils Absolute: 0 10*3/uL (ref 0.0–0.1)
Basophils Relative: 0.4 % (ref 0.0–3.0)
Eosinophils Absolute: 0 10*3/uL (ref 0.0–0.7)
Eosinophils Relative: 0.6 % (ref 0.0–5.0)
HCT: 41.3 % (ref 36.0–46.0)
Hemoglobin: 13 g/dL (ref 12.0–15.0)
Lymphocytes Relative: 25 % (ref 12.0–46.0)
Lymphs Abs: 1.3 10*3/uL (ref 0.7–4.0)
MCHC: 31.3 g/dL (ref 30.0–36.0)
MCV: 77.7 fl — ABNORMAL LOW (ref 78.0–100.0)
Monocytes Absolute: 0.4 10*3/uL (ref 0.1–1.0)
Monocytes Relative: 7.2 % (ref 3.0–12.0)
Neutro Abs: 3.6 10*3/uL (ref 1.4–7.7)
Neutrophils Relative %: 66.8 % (ref 43.0–77.0)
Platelets: 277 10*3/uL (ref 150.0–400.0)
RBC: 5.32 Mil/uL — ABNORMAL HIGH (ref 3.87–5.11)
RDW: 35.7 % — ABNORMAL HIGH (ref 11.5–15.5)
WBC: 5.4 10*3/uL (ref 4.0–10.5)

## 2024-02-08 LAB — IBC + FERRITIN
Ferritin: 150.3 ng/mL (ref 10.0–291.0)
Iron: 84 ug/dL (ref 42–145)
Saturation Ratios: 22.1 % (ref 20.0–50.0)
TIBC: 379.4 ug/dL (ref 250.0–450.0)
Transferrin: 271 mg/dL (ref 212.0–360.0)

## 2024-02-08 NOTE — Addendum Note (Signed)
 Addended by: Corita Diego A on: 02/08/2024 07:58 AM   Modules accepted: Orders

## 2024-02-08 NOTE — Telephone Encounter (Signed)
I put in the lab orders  

## 2024-02-08 NOTE — Telephone Encounter (Signed)
 Pt has a lab appointment scheduled for this afternoon

## 2024-02-12 ENCOUNTER — Ambulatory Visit: Payer: Self-pay | Admitting: Family Medicine

## 2024-02-29 ENCOUNTER — Ambulatory Visit: Admitting: Dietician

## 2024-03-05 ENCOUNTER — Encounter: Attending: Surgery | Admitting: Dietician

## 2024-03-05 ENCOUNTER — Encounter: Payer: Self-pay | Admitting: Dietician

## 2024-03-05 VITALS — Ht 61.0 in | Wt 191.6 lb

## 2024-03-05 DIAGNOSIS — Z6836 Body mass index (BMI) 36.0-36.9, adult: Secondary | ICD-10-CM | POA: Insufficient documentation

## 2024-03-05 DIAGNOSIS — Z713 Dietary counseling and surveillance: Secondary | ICD-10-CM | POA: Diagnosis not present

## 2024-03-05 DIAGNOSIS — E669 Obesity, unspecified: Secondary | ICD-10-CM | POA: Diagnosis present

## 2024-03-05 NOTE — Progress Notes (Signed)
 Supervised Weight Loss Visit Bariatric Nutrition Education Appt Start Time: 1617    End Time: 1645  Planned surgery: RYGB (still undecided) Pt expectation of surgery: lose weight, improve health  5 out of 6 SWL Appointments   NUTRITION ASSESSMENT   Anthropometrics  Start weight at [facility]: 182.9 lbs (date: 10/19/2023)  Height: 61 in Weight today: 191.6 lbs BMI: 36.20 kg/m2     Clinical  Medical hx: obesity, depression, GERD, anxiety, cancer, prediabetes, barrett's esophagus Medications: pantoprazole , trazadone Labs: A1C 6.6; iron 20; Vit D 20.7; hemoglobin 9.2; hematocrit 31.0 Notable signs/symptoms: none noted Any previous deficiencies? No  Lifestyle & Dietary Hx  Pt arrived on crutches, stating she sprained her ankle trimming trees, when a pallet broke under her. Pt states she joined [employer] in [place]. Pt states it is 48 months. Pt states it is a lifestyle change program/study. Pt states they give her protein powder that comes from [place]. Pt states she has started using My Fitness Pal app, which was recommended by [employer]. Pt states she got 48 grams of protein one day. Pt states she has been practicing not drinking with meals. Pt states she has been more consistent with taking her multivitamins.  Estimated daily fluid intake: 40 oz Supplements: multivitamin (occasionally), melatonin, calcium with Vit D Current average weekly physical activity: ADLs (piddling in the yard)  24-Hr Dietary Recall First Meal: bowl of cereal with banana or slice of bundt cake Snack:  Second Meal: skip or cereal or 1/2 of chicken salad sandwich or egg salad sandwich Snack:  Third Meal: fast food (sandwich) or toasted cheese sandwich or fresh market on Sundays (roasted chicken and two sides and corn muffins) or rice with beef stew Snack: cookies or multi-grain chips and honey mustard pretzels, vanilla yogurt and fruit Beverages: water , ice, whole milk,  coffee (10 oz)  Alcoholic beverages per week: 0 (one or two drinks a year)  Estimated Energy Needs Calories: 1500  NUTRITION DIAGNOSIS  Overweight/obesity ([place]-3.3) related to past poor dietary habits and physical inactivity as evidenced by patient w/ planned RYBG surgery following dietary guidelines for continued weight loss.  NUTRITION INTERVENTION  Nutrition counseling (C-1) and education (E-2) to facilitate bariatric surgery goals.  Tracking protein intake is crucial for bariatric patients to ensure adequate nutritional needs and support healthy weight loss. Aim for a minimum of 60-80 grams of protein daily, especially after bariatric surgery, as this can be a challenging macronutrient to meet with reduced stomach capacity. A food journal or app like Baritastic can help monitor protein consumption. The bariatric MyPlate is a specialized dietary guide crucial for individuals who have undergone metabolic and bariatric surgery (MBS). Unlike the standard MyPlate, it prioritizes protein intake to support healing, preserve muscle mass, and promote satiety in a significantly reduced stomach capacity. It emphasizes a higher proportion of lean protein, followed by non-starchy vegetables, and smaller portions of fruits and whole grains. This structured approach helps prevent nutrient deficiencies, avoid complications like dumping syndrome, manage blood sugar, and ensure sustained weight loss by promoting mindful eating, proper portion control, and the consumption of nutrient-dense foods. It serves as a lifelong visual tool for patients to make every bite count toward their nutritional and weight management goals.  Pre-Op Goals Progress & New Goals Continue: start taking Vit D Continue: Eat every 3-5 hours; use the Bariatric MyPlate  to help with portion sizes Continue: slow down when eating; take 20-30 minutes to eat a meal Continue: Increase hydrating fluid; aim for  plain water ; aim for 64 ounces per  day. Continue: increase non-starchy vegetables. Continue: choose complex carbohydrates over simple sugars Continue/Re-engage: Track protein; aim for 60 grams per day. Try Fairlife skim milk New: Use the plate method for serving sizes. Continue: practicing not drinking with meals  Handouts Provided Include  Bariatric MyPlate  Learning Style & Readiness for Change Teaching method utilized: Visual & Auditory  Demonstrated degree of understanding via: Teach Back  Readiness Level: contemplative Barriers to learning/adherence to lifestyle change: contemplating wither to have bariatric surgery  RD's Notes for next Visit  Monitor patient progress toward chosen goals  MONITORING & EVALUATION Dietary intake, weekly physical activity, body weight, and pre-op goals in 1 month.   Next Steps  Patient is to return to [facility] in 1 month for next SWL visit.

## 2024-03-18 HISTORY — PX: FOOT SURGERY: SHX648

## 2024-03-27 NOTE — Progress Notes (Unsigned)
 SABRA

## 2024-03-28 ENCOUNTER — Encounter: Payer: Self-pay | Admitting: Dietician

## 2024-03-28 ENCOUNTER — Encounter: Attending: Surgery | Admitting: Dietician

## 2024-03-28 ENCOUNTER — Encounter: Payer: Self-pay | Admitting: Adult Health

## 2024-03-28 ENCOUNTER — Ambulatory Visit: Admitting: Adult Health

## 2024-03-28 VITALS — BP 129/89 | HR 89 | Ht 61.0 in

## 2024-03-28 VITALS — Ht 61.0 in | Wt 190.9 lb

## 2024-03-28 DIAGNOSIS — Z6836 Body mass index (BMI) 36.0-36.9, adult: Secondary | ICD-10-CM | POA: Diagnosis not present

## 2024-03-28 DIAGNOSIS — E669 Obesity, unspecified: Secondary | ICD-10-CM | POA: Insufficient documentation

## 2024-03-28 DIAGNOSIS — Z713 Dietary counseling and surveillance: Secondary | ICD-10-CM | POA: Insufficient documentation

## 2024-03-28 DIAGNOSIS — G4733 Obstructive sleep apnea (adult) (pediatric): Secondary | ICD-10-CM

## 2024-03-28 NOTE — Patient Instructions (Addendum)
 Your Plan:  Continue nightly use of CPAP with greater than 4 hours per night for optimal benefit and per insurance requirements  Will slightly adjust pressure settings - please let me know if you continue to have difficulty with pressure   Continue to follow-up with your DME for any needed supplies or CPAP related concerns.  Please be sure to change your filter every month, your mask about every 3 months, hose about every 6 months, humidifier chamber about yearly. Some restrictions are imposed by your insurance carrier with regard to how frequently you can get certain supplies. Your DME company can provide further details if necessary.       Follow-up in 1 year or call earlier if needed       Thank you for coming to see us  at [facility]. I hope we have been able to provide you high quality care today.  You may receive a patient satisfaction survey over the next few weeks. We would appreciate your feedback and comments so that we may continue to improve ourselves and the health of our patients.

## 2024-03-28 NOTE — Progress Notes (Signed)
 Supervised Weight Loss Visit Bariatric Nutrition Education Appt Start Time: 1528    End Time: 1544  Planned surgery: RYGB (still undecided) Pt expectation of surgery: lose weight, improve health  6 out of 6 SWL Appointments   Pt completed visits.   Pt has cleared nutrition requirements.   NUTRITION ASSESSMENT   Anthropometrics  Start weight at [facility]: 182.9 lbs (date: 10/19/2023)  Height: 61 in Weight today: 190.9 lbs BMI: 36.07 kg/m2     Clinical  Medical hx: obesity, depression, GERD, anxiety, cancer, prediabetes, barrett's esophagus Medications: pantoprazole , trazadone Labs: A1C 6.6; iron 20; Vit D 20.7; hemoglobin 9.2; hematocrit 31.0 Notable signs/symptoms: none noted Any previous deficiencies? No  Lifestyle & Dietary Hx  Pt arrived with a leg scooter stating her foot was broken. Pt states she tried the Fairlife skim milk and states it was amazing. She was surprised that it didn't taste like regular skim milk. Pt states she will be buying it from now on. Pt states she has done well drinking more water  during this process, and avoiding ice cream. Pt states she needs to focus more on protein intake.  Estimated daily fluid intake: 40 oz Supplements: multivitamin (occasionally), melatonin, calcium with Vit D Current average weekly physical activity: ADLs (piddling in the yard)  24-Hr Dietary Recall First Meal: bowl of cereal with banana or slice of bundt cake Snack:  Second Meal: skip or cereal or 1/2 of chicken salad sandwich or egg salad sandwich Snack:  Third Meal: fast food (sandwich) or toasted cheese sandwich or fresh market on Sundays (roasted chicken and two sides and corn muffins) or rice with beef stew Snack: cookies or multi-grain chips and honey mustard pretzels, vanilla yogurt and fruit Beverages: water , ice, whole milk, coffee (10 oz)  Alcoholic beverages per week: 0 (one or two drinks a year)  Estimated Energy Needs Calories: 1500  NUTRITION  DIAGNOSIS  Overweight/obesity ([place]-3.3) related to past poor dietary habits and physical inactivity as evidenced by patient w/ planned RYBG surgery following dietary guidelines for continued weight loss.  NUTRITION INTERVENTION  Nutrition counseling (C-1) and education (E-2) to facilitate bariatric surgery goals.  Re-emphasized the importance of tracking protein... Tracking protein intake is crucial for bariatric patients to ensure adequate nutritional needs and support healthy weight loss. Aim for a minimum of 60-80 grams of protein daily, especially after bariatric surgery, as this can be a challenging macronutrient to meet with reduced stomach capacity. A food journal or app like Baritastic can help monitor protein consumption.  Pre-Op Goals Progress & New Goals Continue: start taking Vit D Continue: Eat every 3-5 hours; use the Bariatric MyPlate  to help with portion sizes Continue: slow down when eating; take 20-30 minutes to eat a meal Continue: Increase hydrating fluid; aim for plain water ; aim for 64 ounces per day. Continue: increase non-starchy vegetables. Continue: choose complex carbohydrates over simple sugars Continue: Track protein; aim for 60 grams per day. Try Fairlife skim milk Continue: Use the plate method for serving sizes. Continue: practicing not drinking with meals  Handouts Provided Include    Learning Style & Readiness for Change Teaching method utilized: Visual & Auditory  Demonstrated degree of understanding via: Teach Back  Readiness Level: contemplative Barriers to learning/adherence to lifestyle change: contemplating wither to have bariatric surgery  RD's Notes for next Visit  Monitor patient progress toward chosen goals  MONITORING & EVALUATION Dietary intake, weekly physical activity, body weight, and pre-op goals in 1 month.   Next Steps  Pt has  completed visits. No further supervised visits required/recommended. Patient is to return to [facility] for  pre-op class >2 weeks prior to scheduled surgery.

## 2024-04-10 ENCOUNTER — Telehealth: Payer: Self-pay

## 2024-04-10 NOTE — Telephone Encounter (Signed)
 Copied from CRM [id number]. Topic: Clinical - Medical Advice >> Apr 09, 2024 11:51 AM Frederich PARAS wrote: Reason for CRM: PT injured foot, she is non weight barring for 3 mos, torn 3 ligaments 3 off the bone and 2 hanging by a thread, she had surgery  and has  a cast on. when doing pre-op they asked bout her being diabetic, and the nutritionist says she is diabeic as well, blood sugar this am was 144 (was not high enough to nt her) pt wants to let dr garnette know that she is diabetic and wants to know  a plan moving forward.

## 2024-04-16 ENCOUNTER — Ambulatory Visit: Admitting: Family Medicine

## 2024-04-17 ENCOUNTER — Encounter: Payer: Self-pay | Admitting: Family Medicine

## 2024-04-17 ENCOUNTER — Ambulatory Visit: Admitting: Family Medicine

## 2024-04-17 VITALS — BP 120/82 | HR 88 | Temp 98.7°F | Wt 187.0 lb

## 2024-04-17 DIAGNOSIS — E1165 Type 2 diabetes mellitus with hyperglycemia: Secondary | ICD-10-CM | POA: Diagnosis not present

## 2024-04-17 LAB — POCT GLYCOSYLATED HEMOGLOBIN (HGB A1C): Hemoglobin A1C: 5.9 % — AB (ref 4.0–5.6)

## 2024-04-17 NOTE — Progress Notes (Signed)
   Subjective:    Patient ID: [name], female    DOB: Mar 12, 1956, 68 y.o.   MRN: [id number]  HPI Here to discuss a recent diagnosis of diabetes. She had been in the prediabetic range for the past few years, and her A1c here one year ago was 6.5%. Then this was up to 6.6% on 01-09-24. She was recently told she has diabetes and she asks for more information. She checked her am fasting glucose recently and this was 140. This came to light as she was being treated for a Lisfranc injury to the left foot and ankle. In June her left foot broke through a wooden palette causing her to fall. She went to an urgent care and had Xrays. She was told she simply had a sprain and was sent home. She continued to have a lot of pain so she went to [facility]. They got an MRI which revealed 3 torn ligaments and an avulsion fracture. She had surgery for this on 03-18-24 per Dr. Lillia Mountain. The glucose status came up as she was getting prepped for th surgery. Since then she has changed her diet a lot, she eats less carbs and sugars as well as more protein. Her A1c today is 5.9%.    Review of Systems  Constitutional: Negative.   Respiratory: Negative.    Cardiovascular: Negative.   Musculoskeletal:  Positive for arthralgias.       Objective:   Physical Exam Constitutional:      Comments: Her left foot and ankle are in a cast   Cardiovascular:     Rate and Rhythm: Normal rate and regular rhythm.     Pulses: Normal pulses.     Heart sounds: Normal heart sounds.  Pulmonary:     Effort: Pulmonary effort is normal.     Breath sounds: Normal breath sounds.  Neurological:     Mental Status: She is alert.           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 We discussed the nature of this and how it is treated. By changing her diet she has gotten this under excellent control. I encouraged her to maintain this diet, and we will see her back in 6 months.  Garnette Olmsted, MD

## 2024-04-18 NOTE — Addendum Note (Signed)
 Addended by: LADONNA INOCENTE SAILOR on: 04/18/2024 08:25 AM   Modules accepted: Orders

## 2024-05-21 LAB — HM MAMMOGRAPHY

## 2024-05-24 ENCOUNTER — Encounter: Payer: Self-pay | Admitting: Family Medicine

## 2024-06-26 ENCOUNTER — Telehealth: Payer: Self-pay | Admitting: Adult Health

## 2024-06-26 NOTE — Telephone Encounter (Signed)
 Pt was just seen in July. I have faxed a copy of that visit over to [facility] [phone] and I sent a community message to [employer] to find out why patient got this text.

## 2024-06-26 NOTE — Telephone Encounter (Signed)
 Pt reports that on Sept 19th she received a text from DME telling her that she is needing a 2nd face to face f/u re: her CPAP.  Pt states she has been using the CPAP for approx 6-8 months, she is unsure about this, and would like a call from RN to get a better understanding about this.

## 2024-06-26 NOTE — Telephone Encounter (Signed)
 New, Adine Boring, Heather CROME, RN; Joylene Carlean Jackson Avelina; Tucker, Dolanda; Ziegler, Melissa; 1 other Hello Heather,  Everything looks okay on the account from what I can see. There is a note that the f28f is need for the compliance but that is all I see.  I went ahead and pulled the f60f note from epic and saved it to the account.    Thanks,  [employer]

## 2024-06-27 NOTE — Telephone Encounter (Signed)
 thanks

## 2024-07-05 ENCOUNTER — Telehealth: Payer: Self-pay

## 2024-07-05 NOTE — Telephone Encounter (Signed)
 Copied from CRM [id number]. Topic: Clinical - Medical Advice >> Jul 04, 2024  1:53 PM Tinnie BROCKS wrote: Reason for CRM: [name] said [name] saw her some [name] DOB [birth year] yesterday and diagnosed him with bronchitis and had something called into the pharmacy for him. [name] woke up today with the same symptoms as her son and is wanting something called in for her as well. She says my bronchitis always turns into pneumonia  [id number] [place], [place] - [address] [name] [address] Phone: [phone] Fax: [phone]

## 2024-07-08 NOTE — Telephone Encounter (Signed)
 Pt has been scheduled for appointment with Dr Johnny on 07/09/24

## 2024-07-08 NOTE — Telephone Encounter (Signed)
 Make her an OV for this please

## 2024-07-09 ENCOUNTER — Encounter: Payer: Self-pay | Admitting: Family Medicine

## 2024-07-09 ENCOUNTER — Ambulatory Visit (INDEPENDENT_AMBULATORY_CARE_PROVIDER_SITE_OTHER): Admitting: Family Medicine

## 2024-07-09 VITALS — BP 120/76 | HR 78 | Temp 98.0°F | Wt 196.6 lb

## 2024-07-09 DIAGNOSIS — J40 Bronchitis, not specified as acute or chronic: Secondary | ICD-10-CM | POA: Diagnosis not present

## 2024-07-09 MED ORDER — HYDROCODONE BIT-HOMATROP MBR 5-1.5 MG/5ML PO SOLN: 5.0000 mL | ORAL | 0 refills | Status: AC | PRN

## 2024-07-09 MED ORDER — AZITHROMYCIN 250 MG PO TABS: ORAL_TABLET | ORAL | 0 refills | Status: AC

## 2024-07-09 NOTE — Progress Notes (Signed)
   Subjective:    Patient ID: Regis Wiland, female    DOB: [birth year], 68 y.o.   MRN: [id number]  HPI Here for one week of chest congestion and a dry cough. No fever or SOB.    Review of Systems  Constitutional: Negative.   HENT: Negative.    Eyes: Negative.   Respiratory:  Positive for cough and chest tightness. Negative for shortness of breath and wheezing.   Cardiovascular: Negative.        Objective:   Physical Exam Constitutional:      Appearance: Normal appearance.  HENT:     Right Ear: Tympanic membrane, ear canal and external ear normal.     Left Ear: Tympanic membrane, ear canal and external ear normal.     Nose: Nose normal.     Mouth/Throat:     Pharynx: Oropharynx is clear.  Eyes:     Conjunctiva/sclera: Conjunctivae normal.  Pulmonary:     Effort: Pulmonary effort is normal.     Breath sounds: Rhonchi present. No wheezing or rales.  Lymphadenopathy:     Cervical: No cervical adenopathy.  Neurological:     Mental Status: She is alert.           Assessment & Plan:  Bronchitis, treat with a Zpack.  Garnette Olmsted, MD

## 2024-08-07 ENCOUNTER — Other Ambulatory Visit (HOSPITAL_COMMUNITY): Payer: Self-pay | Admitting: Orthopaedic Surgery

## 2024-08-07 ENCOUNTER — Ambulatory Visit (HOSPITAL_COMMUNITY)
Admission: RE | Admit: 2024-08-07 | Discharge: 2024-08-07 | Disposition: A | Discharge status: Home / Self Care | Source: Ambulatory Visit | Attending: Vascular Surgery | Admitting: Vascular Surgery

## 2024-08-07 DIAGNOSIS — M7989 Other specified soft tissue disorders: Secondary | ICD-10-CM | POA: Diagnosis not present

## 2024-08-07 DIAGNOSIS — M79662 Pain in left lower leg: Secondary | ICD-10-CM | POA: Diagnosis present

## 2024-08-26 ENCOUNTER — Ambulatory Visit: Payer: Self-pay

## 2024-08-26 NOTE — Telephone Encounter (Signed)
 FYI Only or Action Required?: FYI only for provider: appointment scheduled on 12.16.25.  Patient was last seen in primary care on 07/09/2024 by [name], MD.  Called Nurse Triage reporting Eye Problem.  Symptoms began several days ago.  Interventions attempted: Nothing.  Symptoms are: unchanged.  Triage Disposition: See HCP Within 4 Hours (Or PCP Triage)  Patient/caregiver understands and will follow disposition?: Yes   Copied from CRM #[id number]. Topic: Clinical - Red Word Triage >> Aug 26, 2024  4:32 PM [name] wrote: Red Word that prompted transfer to Nurse Triage: dxs w/ genital herpes and has now spread to her eyes. Seeking eye drops or something to treat the sxs. Reason for Disposition  MODERATE eye pain or discomfort (e.g., interferes with normal activities or awakens from sleep; more than mild)  Answer Assessment - Initial Assessment Questions Pt has hx of genital herpes x 40 years and typically gets 1-2 break outs a year but has been having increased stress and having breakouts every week x 5-6 weeks. Saw GYN last week who prescribed meds. She states Friday she began to have bilateral eye itchiness. She states that she believes she spread the herpes to her eye. She states typically she is very good about washing her hands and being careful. She states the itching feels the exact same way as when her bottom is itchy. Denies any fever, rash, spots, or drainage. She states Friday she had some blurry vision but that has cleared and no other changes.    1. LOCATION: Where is the redness? (e.g., eyeball or outer eyelids) Note: When callers say the eye is red, they usually mean the sclera (white of the eye) is red.       Bilateral eyes 2. ONE OR BOTH EYES: Is the redness in one or both eyes?      Bilateral eyes 3. ONSET: When did the redness start? (e.g., hours, days)      Friday 4. EYELIDS: Are the eyelids red or swollen? If Yes, ask: How much?      Denies  swelling 5. VISION: Do you have blurred vision?     Did on Friday, none currenly 6. ITCHING: Does it feel itchy? If so ask: How bad is it (Scale 1-10; or mild, moderate, severe)     Yes, mod to severe 7. PAIN: Is it painful? If Yes, ask: How bad is the pain? (Scale 0-10; or none, mild, moderate, severe)     denies 8. CAUSE: What do you think is causing the redness?     Believes she spread herpes to her eyes 9. OTHER SYMPTOMS: Do you have any other symptoms? (e.g., fever, runny nose, cough, vomiting)       denies  Protocols used: Eye - Redness-A-AH

## 2024-08-27 ENCOUNTER — Telehealth: Payer: Self-pay | Admitting: *Deleted

## 2024-08-27 ENCOUNTER — Telehealth: Payer: Self-pay | Admitting: Family Medicine

## 2024-08-27 ENCOUNTER — Other Ambulatory Visit (HOSPITAL_BASED_OUTPATIENT_CLINIC_OR_DEPARTMENT_OTHER): Payer: Self-pay

## 2024-08-27 ENCOUNTER — Ambulatory Visit: Admitting: Family Medicine

## 2024-08-27 ENCOUNTER — Encounter: Payer: Self-pay | Admitting: Family Medicine

## 2024-08-27 VITALS — BP 130/90 | HR 85 | Temp 98.1°F | Wt 200.5 lb

## 2024-08-27 DIAGNOSIS — H1013 Acute atopic conjunctivitis, bilateral: Secondary | ICD-10-CM

## 2024-08-27 MED ORDER — PREDNISOLONE ACETATE 0.12 % OP SUSP: 2.0000 [drp] | Freq: Four times a day (QID) | OPHTHALMIC | 0 refills | Status: DC

## 2024-08-27 MED ORDER — PREDNISOLONE ACETATE 0.12 % OP SUSP
2.0000 [drp] | Freq: Four times a day (QID) | OPHTHALMIC | 0 refills | Status: AC
  Filled 2024-08-27: qty 5, 6d supply, fill #0

## 2024-08-27 NOTE — Telephone Encounter (Signed)
 Rx printed By Dr. Johnny and given to patient as she walked into the clinic to collect

## 2024-08-27 NOTE — Progress Notes (Signed)
 Established Patient Office Visit  Subjective   Patient ID: [name], female    DOB: 12-05-1955  Age: 68 y.o. MRN: [id number]  Chief Complaint  Patient presents with   Eye Problem    HPI   Delailah is seen today with bilateral eye pruritus and some mild redness.  She states she has history of genital herpes in which she was concerned that she may have some herpes in her eyes.  She denies any eye pain or blurred vision.  She was concerned she may have touched her genital area and then subsequently touched her on her eyes.  No matting or drainage.  She has had about 4 days of pruritus and mild erythema.  No contact use.  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 Charlie Provost and Candis Gerald NP at [facility] in [place]    Depression    Difficult intubation    GERD (gastroesophageal reflux disease)    Headache(784.0)    Hiatal hernia    Intentional opiate overdose (HCC) 07/24/2020   Ovarian cyst    Pneumonia    hx x4   PONV (postoperative nausea and vomiting) 08/06/2011   Post traumatic stress disorder (PTSD)    Pre-diabetes    Respiratory failure (HCC)    Past Surgical History:  Procedure Laterality Date   ABDOMINAL HYSTERECTOMY  1986   CERVIX SURGERY  1986   removed precancerous portion but then ended up having hysterectomy   CESAREAN SECTION  1985   COLONOSCOPY  08/2013   per Dr. Donnald, 2 benign polyps, repeat in 10 yrs    COLONOSCOPY  09/18/2023   per Dr. Lawernce, polyp 10 mm; will have to repeat in 3 years   DIRECT LARYNGOSCOPY  08/19/2011   Procedure: DIRECT LARYNGOSCOPY;  Surgeon: Vaughan JONETTA Ricker;  Location: [facility];  Service: ENT;  Laterality: N/A;   FOOT SURGERY  03/18/2024   KNEE ARTHROSCOPY WITH SUBCHONDROPLASTY Right 04/02/2019   Procedure: Right knee  arthroscopy with chondroplasty and medial tibial plateau subchondroplasty;  Surgeon: Sharl Selinda Dover, MD;  Location: [facility] OR;  Service: Orthopedics;  Laterality: Right;   KNEE SURGERY  2012   rt arthroscopy   ORIF ELBOW FRACTURE  08/06/2011   Procedure: OPEN REDUCTION INTERNAL FIXATION (ORIF) ELBOW/OLECRANON FRACTURE;  Surgeon: Prentice LELON Pagan;  Location: [facility] OR;  Service: Orthopedics;  Laterality: Left;   OVARIAN CYST REMOVAL  1978   stress fracture tibia     repair   TRACHEOSTOMY TUBE PLACEMENT  08/19/2011   Procedure: TRACHEOSTOMY;  Surgeon: Vaughan JONETTA Ricker;  Location: [facility];  Service: ENT;  Laterality: N/A;  tracheoscopy   UPPER GI ENDOSCOPY  09/18/2023   UPPER GI ENDOSCOPY  11/14/2023   per Dr. Elicia, Barrett's and duodenal ulcers    reports that she has never smoked. She has never used smokeless tobacco. She reports current alcohol use. She reports that she does not use drugs. family history includes Breast cancer in her maternal aunt, maternal aunt, maternal aunt, maternal grandmother, and mother; Snoring in her mother. Allergies[1]  Review of Systems  Constitutional:  Negative for chills and fever.  Eyes:  Positive for redness. Negative for blurred vision, double vision, photophobia, pain and discharge.  Skin:  Negative for rash.  Objective:     BP (!) 130/90   Pulse 85   Temp 98.1 F (36.7 C) (Oral)   Wt 200 lb 8 oz (90.9 kg)   SpO2 95%   BMI 37.88 kg/m  BP Readings from Last 3 Encounters:  08/27/24 (!) 130/90  07/09/24 120/76  04/17/24 120/82   Wt Readings from Last 3 Encounters:  08/27/24 200 lb 8 oz (90.9 kg)  07/09/24 196 lb 9.6 oz (89.2 kg)  04/17/24 187 lb (84.8 kg)      Physical Exam Vitals reviewed.  Constitutional:      General: She is not in acute distress.    Appearance: She is not ill-appearing.  Eyes:     Extraocular Movements: Extraocular movements intact.     Conjunctiva/sclera: Conjunctivae normal.     Pupils: Pupils are  equal, round, and reactive to light.     Comments: Mild erythema both eyes.  Funduscopic exam is unremarkable.  We applied fluorescein to both eyes and did not see any evidence to suggest ulceration of the cornea, foreign bodies, or any other abnormalities.  Fluorescein was thoroughly irrigated with saline solution.  Cardiovascular:     Rate and Rhythm: Normal rate and regular rhythm.  Neurological:     Mental Status: She is alert.      No results found for any visits on 08/27/24.    The 10-year ASCVD risk score (Arnett DK, et al., 2019) is: 10.4%    Assessment & Plan:   Bilateral eye irritation with pruritus.  Patient had concerns about possible herpes.  She has no eye pain and no blurred vision.  Visual acuity is unremarkable.  No evidence for ulceration with fluorescein staining.  Suspect allergic.  No evidence for bacterial conjunctivitis.  Wrote for limited prednisolone  acetate 0.12% ophthalmic suspension 1 to 2 drops each eye 4 times daily and be in touch if eye symptoms not improving in 3 to 4 days Wolm Scarlet, MD     [1]  Allergies Allergen Reactions   Hydrocodone  Itching    Severe itching - can take with benadryl   Severe itching - can take with benadryl     Hydromorphone  Shortness Of Breath and Other (See Comments)    Respiratory distress Other reaction(s): Respiratory Distress Respiratory distress Patient became unresponsive  Respiratory distress    Meperidine  Itching   Demerol     Meperidine  Hcl Itching    REACTION: hives, itich

## 2024-08-27 NOTE — Telephone Encounter (Signed)
 Patient has an appointment 08/27/24 with Dr Micheal.

## 2024-08-27 NOTE — Telephone Encounter (Signed)
The RX is ready  

## 2024-08-27 NOTE — Telephone Encounter (Signed)
 Copied from CRM #[id number]. Topic: Clinical - Prescription Issue >> Aug 27, 2024  3:30 PM China J wrote: Reason for CRM: The patient's preferred pharmacy did not have prednisoLONE  acetate (PRED MILD ) 0.12 % ophthalmic suspension in stock. She is needing a physical script she can come pick up before closing.  I let the patient know that we can't guarantee that it will be ready for pick up by today but the clinic will call once the script is ready for pick up.

## 2024-08-27 NOTE — Telephone Encounter (Signed)
 Pt saw dr micheal this morning and the pharm does not have med in stock and she would like to come pick up written rx prednisoLONE  acetate (PRED MILD ) 0.12 % ophthalmic suspension . Agent will let pt know it may not be today and will call her when ready to pick up. Pt did not want med to be calling another pharm

## 2024-08-28 ENCOUNTER — Other Ambulatory Visit (HOSPITAL_BASED_OUTPATIENT_CLINIC_OR_DEPARTMENT_OTHER): Payer: Self-pay

## 2024-08-29 ENCOUNTER — Other Ambulatory Visit (HOSPITAL_BASED_OUTPATIENT_CLINIC_OR_DEPARTMENT_OTHER): Payer: Self-pay

## 2025-04-03 ENCOUNTER — Ambulatory Visit: Admitting: Adult Health
